# Patient Record
Sex: Female | Born: 1965 | Race: White | Hispanic: No | Marital: Married | Smoking: Never smoker
Health system: Southern US, Community
[De-identification: ages and names within clinical notes are randomized; demographics above are authoritative.]

## PROBLEM LIST (undated history)

## (undated) DIAGNOSIS — F329 Major depressive disorder, single episode, unspecified: Secondary | ICD-10-CM

## (undated) DIAGNOSIS — D649 Anemia, unspecified: Secondary | ICD-10-CM

## (undated) DIAGNOSIS — I10 Essential (primary) hypertension: Secondary | ICD-10-CM

## (undated) DIAGNOSIS — F32A Depression, unspecified: Secondary | ICD-10-CM

## (undated) DIAGNOSIS — G43909 Migraine, unspecified, not intractable, without status migrainosus: Secondary | ICD-10-CM

## (undated) DIAGNOSIS — F419 Anxiety disorder, unspecified: Secondary | ICD-10-CM

## (undated) HISTORY — PX: HERNIA REPAIR: SHX51

## (undated) HISTORY — DX: Migraine, unspecified, not intractable, without status migrainosus: G43.909

## (undated) HISTORY — DX: Essential (primary) hypertension: I10

## (undated) HISTORY — PX: GASTRIC BYPASS: SHX52

## (undated) HISTORY — PX: CHOLECYSTECTOMY: SHX55

## (undated) HISTORY — DX: Anemia, unspecified: D64.9

---

## 2001-04-20 HISTORY — PX: GALLBLADDER SURGERY: SHX652

## 2001-04-20 HISTORY — PX: DILATION AND CURETTAGE OF UTERUS: SHX78

## 2002-04-20 HISTORY — PX: GASTRIC BYPASS: SHX52

## 2003-04-21 HISTORY — PX: HERNIA REPAIR: SHX51

## 2015-07-09 ENCOUNTER — Encounter: Payer: Self-pay | Admitting: Family Medicine

## 2015-07-09 ENCOUNTER — Ambulatory Visit (INDEPENDENT_AMBULATORY_CARE_PROVIDER_SITE_OTHER): Payer: Self-pay | Admitting: Family Medicine

## 2015-07-09 VITALS — BP 137/83 | HR 63 | Temp 98.5°F | Resp 18 | Ht 63.0 in | Wt 179.2 lb

## 2015-07-09 DIAGNOSIS — F419 Anxiety disorder, unspecified: Secondary | ICD-10-CM | POA: Insufficient documentation

## 2015-07-09 DIAGNOSIS — D509 Iron deficiency anemia, unspecified: Secondary | ICD-10-CM

## 2015-07-09 DIAGNOSIS — R252 Cramp and spasm: Secondary | ICD-10-CM

## 2015-07-09 DIAGNOSIS — Z7189 Other specified counseling: Secondary | ICD-10-CM

## 2015-07-09 DIAGNOSIS — N920 Excessive and frequent menstruation with regular cycle: Secondary | ICD-10-CM

## 2015-07-09 DIAGNOSIS — Z9884 Bariatric surgery status: Secondary | ICD-10-CM

## 2015-07-09 DIAGNOSIS — E785 Hyperlipidemia, unspecified: Secondary | ICD-10-CM

## 2015-07-09 DIAGNOSIS — F32A Depression, unspecified: Secondary | ICD-10-CM

## 2015-07-09 DIAGNOSIS — Z7689 Persons encountering health services in other specified circumstances: Secondary | ICD-10-CM

## 2015-07-09 DIAGNOSIS — F418 Other specified anxiety disorders: Secondary | ICD-10-CM

## 2015-07-09 DIAGNOSIS — K635 Polyp of colon: Secondary | ICD-10-CM

## 2015-07-09 DIAGNOSIS — F329 Major depressive disorder, single episode, unspecified: Secondary | ICD-10-CM | POA: Insufficient documentation

## 2015-07-09 MED ORDER — MAGNESIUM 250 MG PO TABS: 1.0000 | ORAL_TABLET | Freq: Every day | ORAL | Status: DC

## 2015-07-09 MED ORDER — ESCITALOPRAM OXALATE 10 MG PO TABS: 10.0000 mg | ORAL_TABLET | Freq: Every day | ORAL | Status: DC

## 2015-07-09 NOTE — Progress Notes (Signed)
Subjective:    Patient ID: Crystal Levine, female    DOB: 10-27-1965, 50 y.o.   MRN: [id number]  HPI: Crystal Levine is a 50 y.o. female presenting on 07/09/2015 for Establish Care   HPI  Pt presents to establish care today. Previous care provider was [facility] in [place].  It has been 9 months since Her last PCP visit. Records from previous provider will be requested and reviewed. Current medical problems include:  Anemia- related to gastric bypass in 2004. Sees hematologist. Gets IV iron infusions. Previous hematologist was [facility] in [place]. Needs referral to hematology today. CBC last check in August. Iron transfusion at that time. Heavy periods- was scheduled for a uterine ablation but unable to do so due to her insurance change. Previous GYN in [place]. Menorrhagia x 2 years. Larges clots. Affects anemia. Needs referral to GYN. Had ultrasound done. No fibroids.  Depression/ Anxiety: Previously on effexor 278m and wellbutrin. Was on something for panic attacks in the past. Has had recent stressors with divorce. Was hospitalized twice for depression. Was on prozac for a little while.  Early morning awakenings. No SI/ HI Doesn't sleep well. Is having leg pain at night- thinks it could be anemia related or muscle cramping.    Health maintenance:  Last pap- December 2016- normal.  Due in 5 years.  Mammogram: Unsure family history of Brca due to adoption. Would like a screen a mammogram. Tdap: Unsure.  Colonoscopy 2 years ago found a pre-cancerous polyp- was told to come back in 2 years. [facility] [place]  Did not get flu shot.     Past Medical History  Diagnosis Date  . Anemia   . Migraine     In past   Social History   Social History  . Marital Status: Divorced    Spouse Name: N/A  . Number of Children: N/A  . Years of Education: N/A   Occupational History  . Not on file.   Social History Main  Topics  . Smoking status: Not on file  . Smokeless tobacco: Not on file  . Alcohol Use: No  . Drug Use: No  . Sexual Activity: Not on file   Other Topics Concern  . Not on file   Social History Narrative  . No narrative on file   Family History  Problem Relation Age of Onset  . Adopted: Yes   No current outpatient prescriptions on file prior to visit.   No current facility-administered medications on file prior to visit.    Review of Systems  Constitutional: Negative for fever and chills.  HENT: Negative.   Respiratory: Negative for cough, chest tightness and wheezing.   Cardiovascular: Negative for chest pain and leg swelling.  Gastrointestinal: Negative for nausea, vomiting, abdominal pain, diarrhea and constipation.  Endocrine: Negative.  Negative for cold intolerance, heat intolerance, polydipsia, polyphagia and polyuria.  Genitourinary: Negative for dysuria and difficulty urinating.  Musculoskeletal: Negative.   Neurological: Negative for dizziness, light-headedness and numbness.  Psychiatric/Behavioral: Positive for sleep disturbance. Negative for suicidal ideas. The patient is nervous/anxious.    Per HPI unless specifically indicated above     Objective:    BP 137/83 mmHg  Pulse 63  Temp(Src) 98.5 F (36.9 C) (Oral)  Resp 18  Ht 5' 3" (1.6 m)  Wt 179 lb 3.2 oz (81.285 kg)  BMI 31.75 kg/m2  SpO2 100%  Wt Readings from Last 3 Encounters:  07/09/15 179  lb 3.2 oz (81.285 kg)    GAD 7 : Generalized Anxiety Score 07/09/2015  Nervous, Anxious, on Edge 2  Control/stop worrying 3  Worry too much - different things 3  Trouble relaxing 3  Restless 2  Easily annoyed or irritable 1  Afraid - awful might happen 1  Total GAD 7 Score 15  Anxiety Difficulty Somewhat difficult   Depression screen PHQ 2/9 07/09/2015  Down, Depressed, Hopeless 1  PHQ - 2 Score 1  Altered sleeping 3  Tired, decreased energy 2  Change in appetite 2  Feeling bad or failure about  yourself  1  Trouble concentrating 1  Moving slowly or fidgety/restless 2  Suicidal thoughts 0  PHQ-9 Score 12  Difficult doing work/chores Somewhat difficult      Physical Exam  Constitutional: She is oriented to person, place, and time. She appears well-developed and well-nourished.  HENT:  Head: Normocephalic and atraumatic.  Neck: Neck supple.  Cardiovascular: Normal rate, regular rhythm and normal heart sounds.  Exam reveals no gallop and no friction rub.   No murmur heard. Pulmonary/Chest: Effort normal and breath sounds normal. She has no wheezes. She exhibits no tenderness.  Abdominal: Soft. Normal appearance and bowel sounds are normal. She exhibits no distension and no mass. There is no tenderness. There is no rebound and no guarding.  Musculoskeletal: Normal range of motion. She exhibits no edema or tenderness.  Lymphadenopathy:    She has no cervical adenopathy.  Neurological: She is alert and oriented to person, place, and time.  Skin: Skin is warm and dry.  Psychiatric: She has a normal mood and affect. Her speech is normal and behavior is normal. Judgment and thought content normal. Cognition and memory are normal.   No results found for this or any previous visit.    Assessment & Plan:   Problem List Items Addressed This Visit      Other   Iron deficiency anemia    Check anemia profile. Refer to hematology for management and iron infusions if needed.       Relevant Orders   Comprehensive Metabolic Panel (CMET)   Anemia Profile A   Ambulatory referral to Hematology   Menorrhagia with regular cycle    Refer to GYN to see if ablation is still indicated. Records from GYN have been requested and are pending.       Relevant Orders   Ambulatory referral to Obstetrics / Gynecology   Anxiety and depression    Start lexapro for anxiety symptoms. Check B12, TSH, folate, and vitamin D. Encouraged daily exercise. Reviewed side effects of medication. Consider psych  referral given pt past hospitalizations if not helping.  Recheck 1 mos.       Relevant Medications   escitalopram (LEXAPRO) 10 MG tablet   Other Relevant Orders   VITAMIN D 25 Hydroxy (Vit-D Deficiency, Fractures)   TSH   S/P gastric bypass    Check B12 and folate due to surgery to determine if replacement is needed.       Relevant Orders   Comprehensive Metabolic Panel (CMET)   E52 and Folate Panel   VITAMIN D 25 Hydroxy (Vit-D Deficiency, Fractures)    Other Visit Diagnoses    Encounter to establish care    -  Primary    Mild hyperlipidemia        Check lipid panel.     Relevant Orders    Lipid Profile    Colon polyp  Relevant Orders    Ambulatory referral to General Surgery    Muscle cramps        Check CMET. Trial of magnesium at bedtime to help with symptoms.     Relevant Medications    Magnesium 250 MG TABS       Meds ordered this encounter  Medications  . escitalopram (LEXAPRO) 10 MG tablet    Sig: Take 1 tablet (10 mg total) by mouth daily.    Dispense:  30 tablet    Refill:  11    Order Specific Question:  Supervising Provider    Answer:  Arlis Porta [phone]  . Magnesium 250 MG TABS    Sig: Take 1 tablet (250 mg total) by mouth at bedtime.    Dispense:  30 tablet    Refill:  11    Order Specific Question:  Supervising Provider    Answer:  Arlis Porta [phone]      Follow up plan: Return in about 4 weeks (around 08/06/2015) for anxiety.Marland Kitchen

## 2015-07-09 NOTE — Assessment & Plan Note (Signed)
Refer to GYN to see if ablation is still indicated. Records from GYN have been requested and are pending.

## 2015-07-09 NOTE — Patient Instructions (Signed)
Depression and Anxiety: Let's try escitalopram at bedtime for your symptoms. To start the medication take 1/2 tablet at bedtime for 4 days and then resume full dose. Major side effects are drowsiness, GI upset. Taking at night usually helps.

## 2015-07-09 NOTE — Assessment & Plan Note (Signed)
Check B12 and folate due to surgery to determine if replacement is needed.

## 2015-07-09 NOTE — Assessment & Plan Note (Signed)
Check anemia profile. Refer to hematology for management and iron infusions if needed.

## 2015-07-09 NOTE — Assessment & Plan Note (Signed)
Start lexapro for anxiety symptoms. Check B12, TSH, folate, and vitamin D. Encouraged daily exercise. Reviewed side effects of medication. Consider psych referral given pt past hospitalizations if not helping.  Recheck 1 mos.

## 2015-07-11 LAB — B12 AND FOLATE PANEL
FOLATE: 11.9 ng/mL (ref >3.0)
Vitamin B-12: 107 pg/mL — ABNORMAL LOW (ref 211–946)

## 2015-07-11 LAB — COMPREHENSIVE METABOLIC PANEL
ALBUMIN: 4.3 g/dL (ref 3.5–5.5)
ALK PHOS: 93 IU/L (ref 39–117)
ALT: 8 IU/L (ref 0–32)
AST: 13 IU/L (ref 0–40)
Albumin/Globulin Ratio: 1.7 (ref 1.2–2.2)
BUN / CREAT RATIO: 20 (ref 9–23)
BUN: 12 mg/dL (ref 6–24)
Bilirubin Total: 0.4 mg/dL (ref 0.0–1.2)
CHLORIDE: 105 mmol/L (ref 96–106)
CO2: 21 mmol/L (ref 18–29)
CREATININE: 0.61 mg/dL (ref 0.57–1.00)
Calcium: 9.4 mg/dL (ref 8.7–10.2)
GFR calc Af Amer: 125 mL/min/{1.73_m2} (ref >59)
GFR calc non Af Amer: 108 mL/min/{1.73_m2} (ref >59)
GLUCOSE: 102 mg/dL — AB (ref 65–99)
Globulin, Total: 2.6 g/dL (ref 1.5–4.5)
Potassium: 5.2 mmol/L (ref 3.5–5.2)
Sodium: 140 mmol/L (ref 134–144)
Total Protein: 6.9 g/dL (ref 6.0–8.5)

## 2015-07-11 LAB — ANEMIA PROFILE A
BASOS ABS: 0 10*3/uL (ref 0.0–0.2)
BASOS: 1 %
EOS (ABSOLUTE): 0.1 10*3/uL (ref 0.0–0.4)
EOS: 3 %
HEMOGLOBIN: 11.9 g/dL (ref 11.1–15.9)
Hematocrit: 36.6 % (ref 34.0–46.6)
IRON SATURATION: 8 % — AB (ref 15–55)
IRON: 33 ug/dL (ref 27–159)
Immature Grans (Abs): 0 10*3/uL (ref 0.0–0.1)
Immature Granulocytes: 0 %
LYMPHS ABS: 1 10*3/uL (ref 0.7–3.1)
Lymphs: 27 %
MCH: 27.6 pg (ref 26.6–33.0)
MCHC: 32.5 g/dL (ref 31.5–35.7)
MCV: 85 fL (ref 79–97)
Monocytes Absolute: 0.4 10*3/uL (ref 0.1–0.9)
Monocytes: 10 %
Neutrophils Absolute: 2.3 10*3/uL (ref 1.4–7.0)
Neutrophils: 59 %
PLATELETS: 214 10*3/uL (ref 150–379)
RBC: 4.31 x10E6/uL (ref 3.77–5.28)
RDW: 13.1 % (ref 12.3–15.4)
Retic Ct Pct: 0.8 % (ref 0.6–2.6)
TIBC: 393 ug/dL (ref 250–450)
UIBC: 360 ug/dL (ref 131–425)
WBC: 3.8 10*3/uL (ref 3.4–10.8)

## 2015-07-11 LAB — VITAMIN D 25 HYDROXY (VIT D DEFICIENCY, FRACTURES): Vit D, 25-Hydroxy: 10 ng/mL — ABNORMAL LOW (ref 30.0–100.0)

## 2015-07-11 LAB — LIPID PANEL
CHOLESTEROL TOTAL: 135 mg/dL (ref 100–199)
Chol/HDL Ratio: 1.9 ratio units (ref 0.0–4.4)
HDL: 71 mg/dL (ref >39)
LDL Calculated: 50 mg/dL (ref 0–99)
Triglycerides: 68 mg/dL (ref 0–149)
VLDL CHOLESTEROL CAL: 14 mg/dL (ref 5–40)

## 2015-07-11 LAB — TSH: TSH: 0.328 u[IU]/mL — ABNORMAL LOW (ref 0.450–4.500)

## 2015-07-12 ENCOUNTER — Telehealth: Payer: Self-pay

## 2015-07-12 ENCOUNTER — Other Ambulatory Visit: Payer: Self-pay

## 2015-07-12 NOTE — Telephone Encounter (Signed)
Gastroenterology Pre-Procedure Review  Request Date: 07/29/15 Requesting Physician: Cecille Amsterdam, NP  PATIENT REVIEW QUESTIONS: The patient responded to the following health history questions as indicated:    1. Are you having any GI issues? no 2. Do you have a personal history of Polyps? no 3. Do you have a family history of Colon Cancer or Polyps? no 4. Diabetes Mellitus? no 5. Joint replacements in the past 12 months?no 6. Major health problems in the past 3 months?no 7. Any artificial heart valves, MVP, or defibrillator?no    MEDICATIONS & ALLERGIES:    Patient reports the following regarding taking any anticoagulation/antiplatelet therapy:   Plavix, Coumadin, Eliquis, Xarelto, Lovenox, Pradaxa, Brilinta, or Effient? no Aspirin? no  Patient confirms/reports the following medications:  Current Outpatient Prescriptions  Medication Sig Dispense Refill  . escitalopram (LEXAPRO) 10 MG tablet Take 1 tablet (10 mg total) by mouth daily. 30 tablet 11  . Magnesium 250 MG TABS Take 1 tablet (250 mg total) by mouth at bedtime. 30 tablet 11   No current facility-administered medications for this visit.    Patient confirms/reports the following allergies:  No Known Allergies  No orders of the defined types were placed in this encounter.    AUTHORIZATION INFORMATION Primary Insurance: 1D#: Group #:  Secondary Insurance: 1D#: Group #:  SCHEDULE INFORMATION: Date: 07/29/15 Time: Location: [place]

## 2015-07-15 ENCOUNTER — Other Ambulatory Visit: Payer: Self-pay | Admitting: Family Medicine

## 2015-07-15 DIAGNOSIS — E559 Vitamin D deficiency, unspecified: Secondary | ICD-10-CM

## 2015-07-15 MED ORDER — VITAMIN D (ERGOCALCIFEROL) 1.25 MG (50000 UNIT) PO CAPS: 50000.0000 [IU] | ORAL_CAPSULE | ORAL | Status: DC

## 2015-07-16 ENCOUNTER — Telehealth: Payer: Self-pay | Admitting: Family Medicine

## 2015-07-16 NOTE — Telephone Encounter (Signed)
Pt. Return call for labs Pt call back  # is [phone]

## 2015-07-16 NOTE — Telephone Encounter (Signed)
LMTCB

## 2015-07-18 LAB — T3, FREE: T3, Free: 2.5 pg/mL (ref 2.0–4.4)

## 2015-07-18 LAB — T4, FREE: FREE T4: 1.09 ng/dL (ref 0.82–1.77)

## 2015-07-18 LAB — SPECIMEN STATUS REPORT

## 2015-07-18 LAB — T3: T3 TOTAL: 94 ng/dL (ref 71–180)

## 2015-07-18 LAB — T4: T4 TOTAL: 7.3 ug/dL (ref 4.5–12.0)

## 2015-07-22 ENCOUNTER — Ambulatory Visit (INDEPENDENT_AMBULATORY_CARE_PROVIDER_SITE_OTHER): Payer: Medicaid Other

## 2015-07-22 VITALS — Resp 16 | Ht 63.0 in | Wt 178.0 lb

## 2015-07-22 DIAGNOSIS — E538 Deficiency of other specified B group vitamins: Secondary | ICD-10-CM | POA: Diagnosis not present

## 2015-07-22 MED ORDER — CYANOCOBALAMIN 1000 MCG/ML IJ SOLN
1000.0000 ug | Freq: Once | INTRAMUSCULAR | Status: AC
  Administered 2015-07-22: 1000 ug via INTRAMUSCULAR

## 2015-07-22 MED ORDER — VITAMIN B-12 1000 MCG PO TABS: 1000.0000 ug | ORAL_TABLET | Freq: Every day | ORAL | Status: DC

## 2015-07-23 ENCOUNTER — Ambulatory Visit: Payer: Medicaid Other

## 2015-07-24 ENCOUNTER — Inpatient Hospital Stay: Payer: Medicaid Other | Attending: Internal Medicine | Admitting: Internal Medicine

## 2015-07-24 VITALS — BP 138/78 | HR 78 | Temp 97.8°F | Resp 18 | Wt 175.9 lb

## 2015-07-24 DIAGNOSIS — Z8669 Personal history of other diseases of the nervous system and sense organs: Secondary | ICD-10-CM

## 2015-07-24 DIAGNOSIS — D509 Iron deficiency anemia, unspecified: Secondary | ICD-10-CM | POA: Diagnosis not present

## 2015-07-24 DIAGNOSIS — Z79899 Other long term (current) drug therapy: Secondary | ICD-10-CM

## 2015-07-24 DIAGNOSIS — Z9884 Bariatric surgery status: Secondary | ICD-10-CM

## 2015-07-24 DIAGNOSIS — E538 Deficiency of other specified B group vitamins: Secondary | ICD-10-CM | POA: Insufficient documentation

## 2015-07-24 DIAGNOSIS — R5383 Other fatigue: Secondary | ICD-10-CM | POA: Insufficient documentation

## 2015-07-24 NOTE — Progress Notes (Signed)
[name]  Patient Care Team: Amy Overton Mam, NP as PCP - General (Family Medicine)  CHIEF COMPLAINTS/PURPOSE OF CONSULTATION:   #  Iron Def sec to gastric bypass-   # B12 def on IM B12 injections  # GASTRIC BY PASS [2004]   HISTORY OF PRESENTING ILLNESS:  Crystal Levine [age] y.o.  female  With above history of gastric bypass-  Leading to iron deficiency/ B12 deficiency secondary to malabsorption.  Patient has been receiving IV iron/ Feraheme every 6 months or so  Throughout hematologist in [place].  She moved the area recently.  Her iron levels were checked by her PCP that showed  Iron saturation was 8%/ hemoglobin was normal.   Patient is extremely tired;  This is helped by IV iron every 6 months or so.  Denies any blood in stools or black stools.  She does not take by mouth iron because of  Malabsorption issues.  Her last IV iron was in August 2016.  She has recently been started on B12 injections through PCP.  She requests a nutrition evaluation.   ROS: A complete 10 point review of system is done which is negative except mentioned above in history of present illness  MEDICAL HISTORY:  Past Medical History  Diagnosis Date  . Anemia   . Migraine     In past    SURGICAL HISTORY: Past Surgical History  Procedure Laterality Date  . Gallbladder surgery  2003  . Gastric bypass  2004  . Hernia repair  2005  . Dilation and curettage of uterus  2003    2 Miscarriages    SOCIAL HISTORY: Social History   Social History  . Marital Status: Divorced    Spouse Name: N/A  . Number of Children: N/A  . Years of Education: N/A   Occupational History  . Not on file.   Social History Main Topics  . Smoking status: Never Smoker   . Smokeless tobacco: Not on file  . Alcohol Use: No  . Drug Use: No  . Sexual Activity: Not on file   Other Topics Concern  . Not on file   Social History Narrative  . No narrative on file    FAMILY  HISTORY: Family History  Problem Relation Age of Onset  . Adopted: Yes    ALLERGIES:  has No Known Allergies.  MEDICATIONS:  Current Outpatient Prescriptions  Medication Sig Dispense Refill  . Cyanocobalamin (VITAMIN B-12 IJ) Inject 1,000 mcg as directed every 30 (thirty) days.    Marland Kitchen escitalopram (LEXAPRO) 10 MG tablet Take 1 tablet (10 mg total) by mouth daily. 30 tablet 11  . Magnesium 250 MG TABS Take 1 tablet (250 mg total) by mouth at bedtime. 30 tablet 11  . Vitamin D, Ergocalciferol, (DRISDOL) 50000 units CAPS capsule Take 1 capsule (50,000 Units total) by mouth every 7 (seven) days. 12 capsule 1   No current facility-administered medications for this visit.      Marland Kitchen  PHYSICAL EXAMINATION:   Filed Vitals:   07/24/15 1420  BP: 138/78  Pulse: 78  Temp: 97.8 F (36.6 C)  Resp: 18   Filed Weights   07/24/15 1420  Weight: 175 lb 14.8 oz (79.8 kg)    GENERAL: Well-nourished well-developed; Alert, no distress and comfortable.  Alone.  EYES: no pallor or icterus OROPHARYNX: no thrush or ulceration; good dentition  NECK: supple, no masses felt LYMPH:  no palpable lymphadenopathy in the cervical, axillary or inguinal regions LUNGS:  clear to auscultation and  No wheeze or crackles HEART/CVS: regular rate & rhythm and no murmurs; No lower extremity edema ABDOMEN: abdomen soft, non-tender and normal bowel sounds Musculoskeletal:no cyanosis of digits and no clubbing  PSYCH: alert & oriented x 3 with fluent speech NEURO: no focal motor/sensory deficits SKIN:  no rashes or significant lesions  LABORATORY DATA:  I have reviewed the data as listed Lab Results  Component Value Date   WBC 3.8 07/10/2015   HCT 36.6 07/10/2015   MCV 85 07/10/2015   PLT 214 07/10/2015    Recent Labs  07/10/15 1030  NA 140  K 5.2  CL 105  CO2 21  GLUCOSE 102*  BUN 12  CREATININE 0.61  CALCIUM 9.4  GFRNONAA 108  GFRAA 125  PROT 6.9  ALBUMIN 4.3  AST 13  ALT 8  ALKPHOS 93   BILITOT 0.4      ASSESSMENT & PLAN:   # Iron deficiency-  Saturations 8%/ hemoglobin 11.1 secondary to gastric bypass/malabsorption-  Start IV Feraheme every 6 months with Benadryl premedication.  Patient is awaiting an  A colonoscopy next week.  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 Patient will get B12 injections through PCP.  #  Patient follow-up with me in 6 months with IV Feraheme/ labs  Few days prior.  Thank you Ms. Vincenza Hews for allowing me to participate in the care of your pleasant patient. Please do not hesitate to contact me with questions or concerns in the interim.  # 30 minutes face-to-face with the patient discussing the above plan of care; more than 50% of time spent on counseling and coordination.     Cammie Sickle, MD 07/24/2015 2:41 PM

## 2015-07-25 NOTE — Discharge Instructions (Signed)

## 2015-07-26 ENCOUNTER — Telehealth: Payer: Self-pay | Admitting: Gastroenterology

## 2015-07-26 ENCOUNTER — Inpatient Hospital Stay: Payer: Medicaid Other

## 2015-07-26 VITALS — BP 147/88 | HR 57 | Temp 96.3°F | Resp 20

## 2015-07-26 DIAGNOSIS — D509 Iron deficiency anemia, unspecified: Secondary | ICD-10-CM | POA: Diagnosis not present

## 2015-07-26 MED ORDER — SODIUM CHLORIDE 0.9 % IV SOLN
Freq: Once | INTRAVENOUS | Status: AC
  Administered 2015-07-26: 11:00:00 via INTRAVENOUS
  Filled 2015-07-26: qty 1000

## 2015-07-26 MED ORDER — SODIUM CHLORIDE 0.9 % IV SOLN
510.0000 mg | Freq: Once | INTRAVENOUS | Status: AC
Start: 2015-07-26 — End: 2015-07-26
  Administered 2015-07-26: 510 mg via INTRAVENOUS
  Filled 2015-07-26: qty 17

## 2015-07-26 MED ORDER — DIPHENHYDRAMINE HCL 50 MG/ML IJ SOLN
25.0000 mg | Freq: Once | INTRAMUSCULAR | Status: AC
  Administered 2015-07-26: 25 mg via INTRAVENOUS
  Filled 2015-07-26: qty 1

## 2015-07-26 NOTE — Telephone Encounter (Signed)
Left vm requesting pt to call back and confirm she was cancelling her procedure.

## 2015-07-26 NOTE — Telephone Encounter (Signed)
Patient left voice message that she needs to reschedule her procedure Monday due to starting a new job. Please call her.

## 2015-07-29 ENCOUNTER — Encounter: Admission: RE | Payer: Self-pay | Source: Ambulatory Visit

## 2015-07-29 ENCOUNTER — Encounter: Payer: Self-pay | Admitting: Anesthesiology

## 2015-07-29 ENCOUNTER — Ambulatory Visit: Admission: RE | Admit: 2015-07-29 | Payer: Medicaid Other | Source: Ambulatory Visit | Admitting: Gastroenterology

## 2015-07-29 SURGERY — COLONOSCOPY WITH PROPOFOL
Anesthesia: Choice

## 2015-07-31 ENCOUNTER — Ambulatory Visit: Payer: Medicaid Other

## 2015-08-02 ENCOUNTER — Ambulatory Visit: Payer: Medicaid Other

## 2015-08-05 ENCOUNTER — Telehealth: Payer: Self-pay | Admitting: Family Medicine

## 2015-08-05 DIAGNOSIS — F329 Major depressive disorder, single episode, unspecified: Secondary | ICD-10-CM

## 2015-08-05 DIAGNOSIS — F32A Depression, unspecified: Secondary | ICD-10-CM

## 2015-08-05 DIAGNOSIS — F419 Anxiety disorder, unspecified: Principal | ICD-10-CM

## 2015-08-05 MED ORDER — ESCITALOPRAM OXALATE 10 MG PO TABS: 10.0000 mg | ORAL_TABLET | Freq: Every day | ORAL | Status: DC

## 2015-08-05 NOTE — Telephone Encounter (Signed)
Done

## 2015-08-05 NOTE — Telephone Encounter (Signed)
Pt needs a refill on lexapro sent to [facility] in [place]

## 2015-08-06 ENCOUNTER — Ambulatory Visit: Payer: Self-pay | Admitting: Family Medicine

## 2015-08-28 ENCOUNTER — Encounter: Payer: Medicaid Other | Admitting: Obstetrics and Gynecology

## 2015-09-04 ENCOUNTER — Encounter: Payer: Self-pay | Admitting: Family Medicine

## 2015-09-04 ENCOUNTER — Ambulatory Visit (INDEPENDENT_AMBULATORY_CARE_PROVIDER_SITE_OTHER): Payer: Medicaid Other | Admitting: Family Medicine

## 2015-09-04 VITALS — BP 116/72 | HR 98 | Temp 98.2°F | Resp 16 | Ht 63.0 in | Wt 181.0 lb

## 2015-09-04 DIAGNOSIS — E538 Deficiency of other specified B group vitamins: Secondary | ICD-10-CM | POA: Diagnosis not present

## 2015-09-04 DIAGNOSIS — E559 Vitamin D deficiency, unspecified: Secondary | ICD-10-CM

## 2015-09-04 DIAGNOSIS — F32A Depression, unspecified: Secondary | ICD-10-CM

## 2015-09-04 DIAGNOSIS — G4700 Insomnia, unspecified: Secondary | ICD-10-CM | POA: Diagnosis not present

## 2015-09-04 DIAGNOSIS — F419 Anxiety disorder, unspecified: Secondary | ICD-10-CM

## 2015-09-04 DIAGNOSIS — F418 Other specified anxiety disorders: Secondary | ICD-10-CM

## 2015-09-04 DIAGNOSIS — F329 Major depressive disorder, single episode, unspecified: Secondary | ICD-10-CM

## 2015-09-04 DIAGNOSIS — E0590 Thyrotoxicosis, unspecified without thyrotoxic crisis or storm: Secondary | ICD-10-CM | POA: Diagnosis not present

## 2015-09-04 MED ORDER — DOXYLAMINE SUCCINATE (SLEEP) 25 MG PO TABS: 25.0000 mg | ORAL_TABLET | Freq: Every evening | ORAL | Status: DC | PRN

## 2015-09-04 NOTE — Assessment & Plan Note (Signed)
Continues on therapy. Recheck labs in 1 mos.

## 2015-09-04 NOTE — Progress Notes (Signed)
Subjective:    Patient ID: Crystal Levine, female    DOB: 22-May-1965, 50 y.o.   MRN: [id number]  HPI: Crystal Levine is a 50 y.o. female presenting on 09/04/2015 for Anxiety   HPI  Pt presents for recheck of anxiety. Lexapro is helping.  Tolerating dose. Still not sleeping well. Leg pain has decreased. Has a new job.Poor sleep- has tried melatonin. No relief. Early AM awakenings. Wakes at 2-3am and can't sleep- feels wired. Goes to bed at 930-10pm. Goal for wax up is 545am. Sometimes she falls back to sleep. Has tried [place].   Ears have hard wax. Wants to know if she needs ear lavage.  Past Medical History  Diagnosis Date  . Anemia   . Migraine     In past    Current Outpatient Prescriptions on File Prior to Visit  Medication Sig  . Cyanocobalamin (VITAMIN B-12 IJ) Inject 1,000 mcg as directed every 30 (thirty) days.  Marland Kitchen escitalopram (LEXAPRO) 10 MG tablet Take 1 tablet (10 mg total) by mouth daily.  . Magnesium 250 MG TABS Take 1 tablet (250 mg total) by mouth at bedtime.  . Vitamin D, Ergocalciferol, (DRISDOL) 50000 units CAPS capsule Take 1 capsule (50,000 Units total) by mouth every 7 (seven) days.   No current facility-administered medications on file prior to visit.    Review of Systems  Constitutional: Negative for fever and chills.  HENT: Negative.   Respiratory: Negative for cough, chest tightness and wheezing.   Cardiovascular: Negative for chest pain and leg swelling.  Gastrointestinal: Negative for nausea, vomiting, abdominal pain, diarrhea and constipation.  Endocrine: Negative.  Negative for cold intolerance, heat intolerance, polydipsia, polyphagia and polyuria.  Genitourinary: Negative for dysuria and difficulty urinating.  Musculoskeletal: Negative.   Neurological: Negative for dizziness, light-headedness and numbness.  Psychiatric/Behavioral: Positive for sleep disturbance. Negative for suicidal ideas, behavioral problems and agitation. The patient is  nervous/anxious (improved.).    Per HPI unless specifically indicated above GAD 7 : Generalized Anxiety Score 09/04/2015 07/09/2015  Nervous, Anxious, on Edge 1 2  Control/stop worrying 1 3  Worry too much - different things 1 3  Trouble relaxing 2 3  Restless 0 2  Easily annoyed or irritable 0 1  Afraid - awful might happen 0 1  Total GAD 7 Score 5 15  Anxiety Difficulty Not difficult at all Somewhat difficult          Objective:    BP 116/72 mmHg  Pulse 98  Temp(Src) 98.2 F (36.8 C) (Oral)  Resp 16  Ht 5\' 3"  (1.6 m)  Wt 181 lb (82.101 kg)  BMI 32.07 kg/m2  Wt Readings from Last 3 Encounters:  09/04/15 181 lb (82.101 kg)  07/24/15 175 lb 14.8 oz (79.8 kg)  07/22/15 178 lb (80.74 kg)    Physical Exam  Constitutional: She is oriented to person, place, and time. She appears well-developed and well-nourished.  HENT:  Head: Normocephalic and atraumatic.  Neck: Neck supple.  Cardiovascular: Normal rate, regular rhythm and normal heart sounds.  Exam reveals no gallop and no friction rub.   No murmur heard. Pulmonary/Chest: Effort normal and breath sounds normal. She has no wheezes. She exhibits no tenderness.  Abdominal: Soft. Normal appearance and bowel sounds are normal. She exhibits no distension and no mass. There is no tenderness. There is no rebound and no guarding.  Musculoskeletal: Normal range of motion. She exhibits no edema or tenderness.  Lymphadenopathy:    She has no cervical adenopathy.  Neurological: She is alert and oriented to person, place, and time.  Skin: Skin is warm and dry.  Psychiatric: She has a normal mood and affect. Her behavior is normal. Judgment and thought content normal.   Results for orders placed or performed in visit on 07/09/15  Comprehensive Metabolic Panel (CMET)  Result Value Ref Range   Glucose 102 (H) 65 - 99 mg/dL   BUN 12 6 - 24 mg/dL   Creatinine, Ser 0.61 0.57 - 1.00 mg/dL   GFR calc non Af Amer 108 >59 mL/min/1.73   GFR  calc Af Amer 125 >59 mL/min/1.73   BUN/Creatinine Ratio 20 9 - 23   Sodium 140 134 - 144 mmol/L   Potassium 5.2 3.5 - 5.2 mmol/L   Chloride 105 96 - 106 mmol/L   CO2 21 18 - 29 mmol/L   Calcium 9.4 8.7 - 10.2 mg/dL   Total Protein 6.9 6.0 - 8.5 g/dL   Albumin 4.3 3.5 - 5.5 g/dL   Globulin, Total 2.6 1.5 - 4.5 g/dL   Albumin/Globulin Ratio 1.7 1.2 - 2.2   Bilirubin Total 0.4 0.0 - 1.2 mg/dL   Alkaline Phosphatase 93 39 - 117 IU/L   AST 13 0 - 40 IU/L   ALT 8 0 - 32 IU/L  Lipid Profile  Result Value Ref Range   Cholesterol, Total 135 100 - 199 mg/dL   Triglycerides 68 0 - 149 mg/dL   HDL 71 >39 mg/dL   VLDL Cholesterol Cal 14 5 - 40 mg/dL   LDL Calculated 50 0 - 99 mg/dL   Chol/HDL Ratio 1.9 0.0 - 4.4 ratio units  B12 and Folate Panel  Result Value Ref Range   Vitamin B-12 107 (L) 211 - 946 pg/mL   Folate 11.9 >3.0 ng/mL  VITAMIN D 25 Hydroxy (Vit-D Deficiency, Fractures)  Result Value Ref Range   Vit D, 25-Hydroxy 10.0 (L) 30.0 - 100.0 ng/mL  TSH  Result Value Ref Range   TSH 0.328 (L) 0.450 - 4.500 uIU/mL  Anemia Profile A  Result Value Ref Range   Total Iron Binding Capacity 393 250 - 450 ug/dL   UIBC 360 131 - 425 ug/dL   Iron 33 27 - 159 ug/dL   Iron Saturation 8 (LL) 15 - 55 %   WBC 3.8 3.4 - 10.8 x10E3/uL   RBC 4.31 3.77 - 5.28 x10E6/uL   Hemoglobin 11.9 11.1 - 15.9 g/dL   Hematocrit 36.6 34.0 - 46.6 %   MCV 85 79 - 97 fL   MCH 27.6 26.6 - 33.0 pg   MCHC 32.5 31.5 - 35.7 g/dL   RDW 13.1 12.3 - 15.4 %   Platelets 214 150 - 379 x10E3/uL   Neutrophils 59 %   Lymphs 27 %   Monocytes 10 %   Eos 3 %   Basos 1 %   Neutrophils Absolute 2.3 1.4 - 7.0 x10E3/uL   Lymphocytes Absolute 1.0 0.7 - 3.1 x10E3/uL   Monocytes Absolute 0.4 0.1 - 0.9 x10E3/uL   EOS (ABSOLUTE) 0.1 0.0 - 0.4 x10E3/uL   Basophils Absolute 0.0 0.0 - 0.2 x10E3/uL   Immature Granulocytes 0 %   Immature Grans (Abs) 0.0 0.0 - 0.1 x10E3/uL   Retic Ct Pct 0.8 0.6 - 2.6 %  T4, free  Result Value  Ref Range   Free T4 1.09 0.82 - 1.77 ng/dL  T4  Result Value Ref Range   T4, Total 7.3 4.5 - 12.0 ug/dL  T3  Result Value Ref  Range   T3, Total 94 71 - 180 ng/dL  T3, free  Result Value Ref Range   T3, Free 2.5 2.0 - 4.4 pg/mL  Specimen status report  Result Value Ref Range   specimen status report Comment       Assessment & Plan:   Problem List Items Addressed This Visit      Digestive   B12 deficiency    Continues on therapy. Recheck labs in 1 mos.       Relevant Orders   Vitamin B12     Endocrine   Subclinical hyperthyroidism    Recheck TSH. No symptoms. Consider endocrine referral with continued low TSH.       Relevant Orders   TSH   T4, free   T3, free     Other   Anxiety and depression    Improved. Continue current dose of lexapro.       Insomnia - Primary    Trial of unisom PRN for poor sleep. Reviewed sleep hygiene. Consider trazodone.       Relevant Medications   doxylamine, Sleep, (UNISOM) 25 MG tablet   Vitamin D deficiency    Recheck vitamin D.       Relevant Orders   VITAMIN D 25 Hydroxy (Vit-D Deficiency, Fractures)      Meds ordered this encounter  Medications  . doxylamine, Sleep, (UNISOM) 25 MG tablet    Sig: Take 1 tablet (25 mg total) by mouth at bedtime as needed.    Dispense:  30 tablet    Refill:  1    Order Specific Question:  Supervising Provider    Answer:  Arlis Porta [id number]      Follow up plan: Return in about 1 month (around 10/05/2015) for Lab work.

## 2015-09-04 NOTE — Assessment & Plan Note (Signed)
Improved.  Continue current dose of lexapro

## 2015-09-04 NOTE — Assessment & Plan Note (Signed)
Recheck TSH. No symptoms. Consider endocrine referral with continued low TSH.

## 2015-09-04 NOTE — Patient Instructions (Addendum)
Sleep: Try unisom about 30 minutes prior to bedtime to see if that helps with your sleep. If not, we can try trazodone at bedtime to help.  Please let me know if unisom is not working and we will try the other medication.  Learning About Sleeping Well What does sleeping well mean? Sleeping well means getting enough sleep. How much sleep is enough varies among people. The number of hours you sleep is not as important as how you feel when you wake up. If you do not feel refreshed, you probably need more sleep. Another sign of not getting enough sleep is feeling tired during the day. The average total nightly sleep time is 7 to 8 hours. Healthy adults may need a little more or a little less than this. Why is getting enough sleep important? Getting enough quality sleep is a basic part of good health. When your sleep suffers, your mood and your thoughts can suffer too. You may find yourself feeling more grumpy or stressed. Not getting enough sleep also can lead to serious problems, including injury, accidents, anxiety, and depression. What might cause poor sleeping? Many things can cause sleep problems, including: 1. Stress. Stress can be caused by fear about a single event, such as giving a speech. Or you may have ongoing stress, such as worry about work or school. 2. Depression, anxiety, and other mental or emotional conditions. 3. Changes in your sleep habits or surroundings. This includes changes that happen where you sleep, such as noise, light, or sleeping in a different bed. It also includes changes in your sleep pattern, such as having jet lag or working a late shift. 4. Health problems, such as pain, breathing problems, and restless legs syndrome. 5. Lack of regular exercise. How can you help yourself? Here are some tips that may help you sleep more soundly and wake up feeling more refreshed. Your sleeping area  1. Use your bedroom only for sleeping and sex. A bit of light reading may help  you fall asleep. But if it doesn't, do your reading elsewhere in the house. Don't watch TV in bed. 2. Be sure your bed is big enough to stretch out comfortably, especially if you have a sleep partner. 3. Keep your bedroom quiet, dark, and cool. Use curtains, blinds, or a sleep mask to block out light. To block out noise, use earplugs, soothing music, or a "white noise" machine. Your evening and bedtime routine  1. Create a relaxing bedtime routine. You might want to take a warm shower or bath, listen to soothing music, or drink a cup of noncaffeinated tea. 2. Go to bed at the same time every night. And get up at the same time every morning, even if you feel tired. What to avoid  1. Limit caffeine (coffee, tea, caffeinated sodas) during the day, and don't have any for at least 4 to 6 hours before bedtime. 2. Don't drink alcohol before bedtime. Alcohol can cause you to wake up more often during the night. 3. Don't smoke or use tobacco, especially in the evening. Nicotine can keep you awake. 4. Don't take naps during the day, especially close to bedtime. 5. Don't lie in bed awake for too long. If you can't fall asleep, or if you wake up in the middle of the night and can't get back to sleep within 15 minutes or so, get out of bed and go to another room until you feel sleepy. 6. Don't take medicine right before bed that may keep  you awake or make you feel hyper or energized. Your doctor can tell you if your medicine may do this and if you can take it earlier in the day. If you can't sleep   Imagine yourself in a peaceful, pleasant scene. Focus on the details and feelings of being in a place that is relaxing.  Get up and do a quiet or boring activity until you feel sleepy.  Don't drink any liquids after 6 p.m. if you wake up often because you have to go to the bathroom.  Trazodone tablets What is this medicine? TRAZODONE (TRAZ oh done) is used to treat depression. This medicine may be used for  other purposes; ask your health care provider or pharmacist if you have questions. What should I tell my health care provider before I take this medicine? They need to know if you have any of these conditions: -attempted suicide or thinking about it -bipolar disorder -bleeding problems -glaucoma -heart disease, or previous heart attack -irregular heart beat -kidney or liver disease -low levels of sodium in the blood -an unusual or allergic reaction to trazodone, other medicines, foods, dyes or preservatives -pregnant or trying to get pregnant -breast-feeding How should I use this medicine? Take this medicine by mouth with a glass of water. Follow the directions on the prescription label. Take this medicine shortly after a meal or a light snack. Take your medicine at regular intervals. Do not take your medicine more often than directed. Do not stop taking this medicine suddenly except upon the advice of your doctor. Stopping this medicine too quickly may cause serious side effects or your condition may worsen. A special MedGuide will be given to you by the pharmacist with each prescription and refill. Be sure to read this information carefully each time. Talk to your pediatrician regarding the use of this medicine in children. Special care may be needed. Overdosage: If you think you have taken too much of this medicine contact a poison control center or emergency room at once. NOTE: This medicine is only for you. Do not share this medicine with others. What if I miss a dose? If you miss a dose, take it as soon as you can. If it is almost time for your next dose, take only that dose. Do not take double or extra doses. What may interact with this medicine? Do not take this medicine with any of the following medications: -certain medicines for fungal infections like fluconazole, itraconazole, ketoconazole, posaconazole, voriconazole -cisapride -dofetilide -dronedarone -linezolid -MAOIs like  Carbex, Eldepryl, Marplan, Nardil, and Parnate -mesoridazine -methylene blue (injected into a vein) -pimozide -saquinavir -thioridazine -ziprasidone This medicine may also interact with the following medications: -alcohol -antiviral medicines for HIV or AIDS -aspirin and aspirin-like medicines -barbiturates like phenobarbital -certain medicines for blood pressure, heart disease, irregular heart beat -certain medicines for depression, anxiety, or psychotic disturbances -certain medicines for migraine headache like almotriptan, eletriptan, frovatriptan, naratriptan, rizatriptan, sumatriptan, zolmitriptan -certain medicines for seizures like carbamazepine and phenytoin -certain medicines for sleep -certain medicines that treat or prevent blood clots like dalteparin, enoxaparin, warfarin -digoxin -fentanyl -lithium -NSAIDS, medicines for pain and inflammation, like ibuprofen or naproxen -other medicines that prolong the QT interval (cause an abnormal heart rhythm) -rasagiline -supplements like St. John's wort, kava kava, valerian -tramadol -tryptophan This list may not describe all possible interactions. Give your health care provider a list of all the medicines, herbs, non-prescription drugs, or dietary supplements you use. Also tell them if you smoke, drink alcohol, or use  illegal drugs. Some items may interact with your medicine. What should I watch for while using this medicine? Tell your doctor if your symptoms do not get better or if they get worse. Visit your doctor or health care professional for regular checks on your progress. Because it may take several weeks to see the full effects of this medicine, it is important to continue your treatment as prescribed by your doctor. Patients and their families should watch out for new or worsening thoughts of suicide or depression. Also watch out for sudden changes in feelings such as feeling anxious, agitated, panicky, irritable, hostile,  aggressive, impulsive, severely restless, overly excited and hyperactive, or not being able to sleep. If this happens, especially at the beginning of treatment or after a change in dose, call your health care professional. Dennis Bast may get drowsy or dizzy. Do not drive, use machinery, or do anything that needs mental alertness until you know how this medicine affects you. Do not stand or sit up quickly, especially if you are an older patient. This reduces the risk of dizzy or fainting spells. Alcohol may interfere with the effect of this medicine. Avoid alcoholic drinks. This medicine may cause dry eyes and blurred vision. If you wear contact lenses you may feel some discomfort. Lubricating drops may help. See your eye doctor if the problem does not go away or is severe. Your mouth may get dry. Chewing sugarless gum, sucking hard candy and drinking plenty of water may help. Contact your doctor if the problem does not go away or is severe. What side effects may I notice from receiving this medicine? Side effects that you should report to your doctor or health care professional as soon as possible: -allergic reactions like skin rash, itching or hives, swelling of the face, lips, or tongue -fast, irregular heartbeat -feeling faint or lightheaded, falls -painful erections or other sexual dysfunction -suicidal thoughts or other mood changes -trembling Side effects that usually do not require medical attention (report to your doctor or health care professional if they continue or are bothersome): -constipation -headache -muscle aches or pains -nausea, vomiting -unusually weak or tired This list may not describe all possible side effects. Call your doctor for medical advice about side effects. You may report side effects to FDA at 1-800-FDA-1088. Where should I keep my medicine? Keep out of the reach of children. Store at room temperature between 15 and 30 degrees C (59 to 86 degrees F). Protect from light.  Keep container tightly closed. Throw away any unused medicine after the expiration date. NOTE: This sheet is a summary. It may not cover all possible information. If you have questions about this medicine, talk to your doctor, pharmacist, or health care provider.    2016, Elsevier/Gold Standard. (2012-11-07 15:46:28)

## 2015-09-04 NOTE — Assessment & Plan Note (Signed)
Trial of unisom PRN for poor sleep. Reviewed sleep hygiene. Consider trazodone.

## 2015-09-04 NOTE — Assessment & Plan Note (Signed)
Recheck vitamin D

## 2015-09-25 ENCOUNTER — Ambulatory Visit: Payer: Medicaid Other | Admitting: Dietician

## 2015-10-03 ENCOUNTER — Ambulatory Visit: Payer: Self-pay | Admitting: Physician Assistant

## 2015-10-03 ENCOUNTER — Encounter: Payer: Self-pay | Admitting: Physician Assistant

## 2015-10-03 VITALS — BP 120/70 | HR 86 | Temp 99.0°F

## 2015-10-03 DIAGNOSIS — S93602A Unspecified sprain of left foot, initial encounter: Secondary | ICD-10-CM

## 2015-10-03 DIAGNOSIS — IMO0002 Reserved for concepts with insufficient information to code with codable children: Secondary | ICD-10-CM

## 2015-10-03 DIAGNOSIS — G43709 Chronic migraine without aura, not intractable, without status migrainosus: Secondary | ICD-10-CM

## 2015-10-03 MED ORDER — SUMATRIPTAN SUCCINATE 100 MG PO TABS: 100.0000 mg | ORAL_TABLET | ORAL | Status: DC | PRN
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
## 2015-10-09 ENCOUNTER — Ambulatory Visit: Payer: Medicaid Other | Admitting: Family Medicine

## 2015-10-15 ENCOUNTER — Encounter: Payer: Self-pay | Admitting: Obstetrics and Gynecology

## 2015-11-05 ENCOUNTER — Ambulatory Visit: Payer: Medicaid Other | Admitting: Family Medicine

## 2015-11-06 ENCOUNTER — Encounter: Payer: Self-pay | Admitting: Obstetrics and Gynecology

## 2015-11-06 ENCOUNTER — Ambulatory Visit (INDEPENDENT_AMBULATORY_CARE_PROVIDER_SITE_OTHER): Payer: Managed Care, Other (non HMO) | Admitting: Obstetrics and Gynecology

## 2015-11-06 VITALS — BP 135/72 | HR 65 | Ht 63.0 in | Wt 184.9 lb

## 2015-11-06 DIAGNOSIS — N921 Excessive and frequent menstruation with irregular cycle: Secondary | ICD-10-CM | POA: Diagnosis not present

## 2015-11-06 DIAGNOSIS — D519 Vitamin B12 deficiency anemia, unspecified: Secondary | ICD-10-CM | POA: Insufficient documentation

## 2015-11-06 DIAGNOSIS — N946 Dysmenorrhea, unspecified: Secondary | ICD-10-CM

## 2015-11-06 DIAGNOSIS — D649 Anemia, unspecified: Secondary | ICD-10-CM | POA: Diagnosis not present

## 2015-11-06 DIAGNOSIS — N970 Female infertility associated with anovulation: Secondary | ICD-10-CM | POA: Diagnosis not present

## 2015-11-06 NOTE — Patient Instructions (Signed)
1.Endometrial biopsy is performed today. 2. Endometrial ablation is scheduled for 12/16/2015 3. Return week before surgery for preop appointment  Endometrial Biopsy, Care After Refer to this sheet in the next few weeks. These instructions provide you with information on caring for yourself after your procedure. Your health care provider may also give you more specific instructions. Your treatment has been planned according to current medical practices, but problems sometimes occur. Call your health care provider if you have any problems or questions after your procedure. WHAT TO EXPECT AFTER THE PROCEDURE After your procedure, it is typical to have the following:  You may have mild cramping and a small amount of vaginal bleeding for a few days after the procedure. This is normal. HOME CARE INSTRUCTIONS  Only take over-the-counter or prescription medicine as directed by your health care provider.  Do not douche, use tampons, or have sexual intercourse until your health care provider approves.  Follow your health care provider's instructions regarding any activity restrictions, such as strenuous exercise or heavy lifting. SEEK MEDICAL CARE IF:  You have heavy bleeding or bleeding longer than 2 days after the procedure.  You have bad smelling drainage from your vagina.  You have a fever and chills.  Youhave severe lower stomach (abdominal) pain. SEEK IMMEDIATE MEDICAL CARE IF:  You have severe cramps in your stomach or back.  You pass large blood clots.  Your bleeding increases.  You become weak or lightheaded, or you pass out.   This information is not intended to replace advice given to you by your health care provider. Make sure you discuss any questions you have with your health care provider.   Document Released: 01/25/2013 Document Reviewed: 01/25/2013 Elsevier Interactive Patient Education [employer].

## 2015-11-06 NOTE — Progress Notes (Signed)
GYN ENCOUNTER NOTE  Subjective:       Crystal Levine is a 50 y.o. [phone] female is here for gynecologic evaluation of the following issues:  1. Menorrhagia 2. Abnormal uterine bleeding 3. Chronic anemia.     cycles every 2 weeks with duration of flow 5 days associated with severe cramping requiring 800 mg ibuprofen 3 times a day. 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 History of severe dysmenorrhea with central cramping and perimenstrual low back ache without radiation. Previously scheduled for endometrial ablation in February 207 but moved from [place];03/2015 endometrial biopsy-benign  Gynecologic History Patient's last menstrual period was 10/11/2015.  Contraception: Vasectomy  Obstetric History OB History  Gravida Para Term Preterm AB SAB TAB Ectopic Multiple Living  5 3 3  2 2    3     # Outcome Date GA Lbr Len/2nd Weight Sex Delivery Anes PTL Lv  5 Term 2006   6 lb 2.1 oz (2.781 kg) F Vag-Spont   Y  4 SAB 2003          3 SAB 2003          2 Term 1999   8 lb 1.9 oz (3.683 kg) F Vag-Spont   Y  1 Term 1993   8 lb 2.1 oz (3.688 kg) M Vag-Spont   Y      Past Medical History  Diagnosis Date  . Anemia   . Migraine     In past    Past Surgical History  Procedure Laterality Date  . Gallbladder surgery  2003  . Gastric bypass  2004  . Hernia repair  2005  . Dilation and curettage of uterus  2003    2 Miscarriages    Current Outpatient Prescriptions on File Prior to Visit  Medication Sig Dispense Refill  . Cyanocobalamin (VITAMIN B-12 IJ) Inject 1,000 mcg as directed every 30 (thirty) days.    Marland Kitchen doxylamine, Sleep, (UNISOM) 25 MG tablet Take 1 tablet (25 mg total) by mouth at bedtime as needed. 30 tablet 1  . Magnesium 250 MG TABS Take 1 tablet (250 mg total) by mouth at bedtime. 30 tablet 11  . SUMAtriptan (IMITREX) 100 MG tablet Take 1 tablet (100 mg total) by mouth every 2 (two) hours as needed for  migraine. May repeat in 2 hours if headache persists or recurs. 10 tablet 4  . Vitamin D, Ergocalciferol, (DRISDOL) 50000 units CAPS capsule Take 1 capsule (50,000 Units total) by mouth every 7 (seven) days. 12 capsule 1   No current facility-administered medications on file prior to visit.    No Known Allergies  Social History   Social History  . Marital Status: Divorced    Spouse Name: N/A  . Number of Children: N/A  . Years of Education: N/A   Occupational History  . Not on file.   Social History Main Topics  . Smoking status: Never Smoker   . Smokeless tobacco: Not on file  . Alcohol Use: No  . Drug Use: No  . Sexual Activity: Yes     Comment: vasectomy   Other Topics Concern  . Not on file   Social History Narrative  Works for [employer]  Family History  Problem Relation Age of Onset  . Adopted: Yes  . Family history unknown: Yes    The following portions of the patient's history were reviewed and updated as appropriate: allergies, current medications, past family history,  past medical history, past social history, past surgical history and problem list.  Review of Systems Review of Systems -History of present illness  Objective:   BP 135/72 mmHg  Pulse 65  Ht 5\' 3"  (1.6 m)  Wt 184 lb 14.4 oz (83.87 kg)  BMI 32.76 kg/m2  LMP 10/11/2015 CONSTITUTIONAL: Well-developed, well-nourished female in no acute distress.  HENT:  Normocephalic, atraumatic.  NECK: Normal range of motion, supple, no masses.  Normal thyroid.  SKIN: Skin is warm and dry. No rash noted. Not diaphoretic. No erythema. No pallor. [place]: Alert and oriented to person, place, and time. PSYCHIATRIC: Normal mood and affect. Normal behavior. Normal judgment and thought content. CARDIOVASCULAR:Not Examined RESPIRATORY: Not Examined BREASTS: Not Examined ABDOMEN: Soft, non distended; Non tender.  No Organomegaly. PELVIC:  External Genitalia: Normal  BUS: Normal  Vagina:  Normal  Cervix: Normal; parous  Uterus: Normal size, shape,consistency, mobile; retroverted  Adnexa: Normal  RV: Normal  External exam  Bladder: Nontender MUSCULOSKELETAL: Normal range of motion. No tenderness.  No cyanosis, clubbing, or edema.   Endometrial Biopsy Procedure Note  Pre-operative Diagnosis: menorrhagia  Post-operative Diagnosis: menorrhagia   Procedure Details   Urine pregnancy test was not done.  The risks (including infection, bleeding, pain, and uterine perforation) and benefits of the procedure were explained to the patient and Verbal informed consent was obtained.  Antibiotic prophylaxis against endocarditis was not indicated.   The patient was placed in the dorsal lithotomy position.  Bimanual exam showed the uterus to be in the retroflexed position.  A Graves' speculum inserted in the vagina.   A sharp tenaculum was not applied to the anterior lip of the cervix for stabilization.  A sterile uterine sound was used to sound the uterus to a depth of 9cm.  A Mylex 56mm curette was used to sample the endometrium; abundant yellow tissue is obtained.  Sample was sent for pathologic examination.  Condition: Stable  Complications: None  Plan:  The patient was advised to call for any fever or for prolonged or severe pain or bleeding. She was advised to use OTC acetaminophen and OTC ibuprofen as needed for mild to moderate pain. She was advised to avoid vaginal intercourse for 48 hours or until the bleeding has completely stopped.  Attending Physician Documentation: Brayton Mars, MD   Assessment:   Menorrhagia Anovulatory bleeding Anemia   Plan:   1. Endometrial biopsy 2. Scheduled for endometrial ablation end of August  3. Return for preoperative appointment prior to surgery  Brayton Mars, MD  Note: This dictation was prepared with Dragon dictation along with smaller phrase technology. Any transcriptional errors that result from this  process are unintentional.

## 2015-11-10 LAB — PATHOLOGY

## 2015-11-12 ENCOUNTER — Telehealth: Payer: Self-pay

## 2015-11-12 NOTE — Telephone Encounter (Signed)
-----   Message from Brayton Mars, MD sent at 11/10/2015  8:06 PM EDT ----- Please Notify - Labs normal Endometrial Biopsy benign

## 2015-11-12 NOTE — Telephone Encounter (Signed)
Pt aware emb- neg. Text my chart sign up.

## 2015-11-14 ENCOUNTER — Telehealth: Payer: Self-pay | Admitting: Obstetrics and Gynecology

## 2015-11-14 MED ORDER — IBUPROFEN 800 MG PO TABS: 800.0000 mg | ORAL_TABLET | Freq: Three times a day (TID) | ORAL | 1 refills | Status: DC | PRN

## 2015-11-14 NOTE — Telephone Encounter (Signed)
Pt aware med erx. 

## 2015-11-14 NOTE — Telephone Encounter (Signed)
Pt started her period and she is in bad pain and wants to know if something can be called into [address] until her oblation is done

## 2015-11-15 ENCOUNTER — Other Ambulatory Visit: Payer: Managed Care, Other (non HMO)

## 2015-11-15 ENCOUNTER — Ambulatory Visit: Payer: Self-pay | Admitting: Family Medicine

## 2015-11-15 ENCOUNTER — Other Ambulatory Visit: Payer: Self-pay | Admitting: *Deleted

## 2015-11-15 ENCOUNTER — Other Ambulatory Visit: Payer: Self-pay | Admitting: Family Medicine

## 2015-11-15 DIAGNOSIS — E538 Deficiency of other specified B group vitamins: Secondary | ICD-10-CM

## 2015-11-15 DIAGNOSIS — E0590 Thyrotoxicosis, unspecified without thyrotoxic crisis or storm: Secondary | ICD-10-CM

## 2015-11-15 DIAGNOSIS — E559 Vitamin D deficiency, unspecified: Secondary | ICD-10-CM

## 2015-11-16 LAB — T3, FREE: T3 FREE: 2.4 pg/mL (ref 2.3–4.2)

## 2015-11-16 LAB — VITAMIN D 25 HYDROXY (VIT D DEFICIENCY, FRACTURES): Vit D, 25-Hydroxy: 21 ng/mL — ABNORMAL LOW (ref 30–100)

## 2015-11-16 LAB — T4, FREE: FREE T4: 1 ng/dL (ref 0.8–1.8)

## 2015-11-16 LAB — TSH: TSH: 0.38 mIU/L — ABNORMAL LOW

## 2015-11-16 LAB — VITAMIN B12: VITAMIN B 12: 183 pg/mL — AB (ref 200–1100)

## 2015-11-18 ENCOUNTER — Other Ambulatory Visit: Payer: Self-pay | Admitting: Family Medicine

## 2015-11-18 DIAGNOSIS — E559 Vitamin D deficiency, unspecified: Secondary | ICD-10-CM

## 2015-11-18 MED ORDER — VITAMIN D (ERGOCALCIFEROL) 1.25 MG (50000 UNIT) PO CAPS: 50000.0000 [IU] | ORAL_CAPSULE | ORAL | 1 refills | Status: DC

## 2015-11-27 ENCOUNTER — Ambulatory Visit: Payer: Self-pay

## 2015-12-11 ENCOUNTER — Encounter
Admission: RE | Admit: 2015-12-11 | Discharge: 2015-12-11 | Disposition: A | Discharge status: Home / Self Care | Payer: Managed Care, Other (non HMO) | Source: Ambulatory Visit | Attending: Obstetrics and Gynecology | Admitting: Obstetrics and Gynecology

## 2015-12-11 ENCOUNTER — Ambulatory Visit (INDEPENDENT_AMBULATORY_CARE_PROVIDER_SITE_OTHER): Payer: Managed Care, Other (non HMO) | Admitting: Obstetrics and Gynecology

## 2015-12-11 VITALS — BP 160/80 | HR 67 | Ht 63.0 in | Wt 179.5 lb

## 2015-12-11 DIAGNOSIS — N921 Excessive and frequent menstruation with irregular cycle: Secondary | ICD-10-CM

## 2015-12-11 DIAGNOSIS — Z01818 Encounter for other preprocedural examination: Secondary | ICD-10-CM

## 2015-12-11 DIAGNOSIS — D649 Anemia, unspecified: Secondary | ICD-10-CM

## 2015-12-11 DIAGNOSIS — Z01812 Encounter for preprocedural laboratory examination: Secondary | ICD-10-CM | POA: Diagnosis present

## 2015-12-11 HISTORY — DX: Major depressive disorder, single episode, unspecified: F32.9

## 2015-12-11 HISTORY — DX: Depression, unspecified: F32.A

## 2015-12-11 HISTORY — DX: Anxiety disorder, unspecified: F41.9

## 2015-12-11 LAB — RAPID HIV SCREEN (HIV 1/2 AB+AG)
HIV 1/2 ANTIBODIES: NONREACTIVE
HIV-1 P24 ANTIGEN - HIV24: NONREACTIVE

## 2015-12-11 LAB — CBC WITH DIFFERENTIAL/PLATELET
BASOS ABS: 0 10*3/uL (ref 0–0.1)
BASOS PCT: 1 %
EOS PCT: 3 %
Eosinophils Absolute: 0.1 10*3/uL (ref 0–0.7)
HCT: 34.8 % — ABNORMAL LOW (ref 35.0–47.0)
Hemoglobin: 11.1 g/dL — ABNORMAL LOW (ref 12.0–16.0)
LYMPHS PCT: 36 %
Lymphs Abs: 1.4 10*3/uL (ref 1.0–3.6)
MCH: 25.1 pg — ABNORMAL LOW (ref 26.0–34.0)
MCHC: 31.8 g/dL — ABNORMAL LOW (ref 32.0–36.0)
MCV: 78.9 fL — AB (ref 80.0–100.0)
Monocytes Absolute: 0.3 10*3/uL (ref 0.2–0.9)
Monocytes Relative: 7 %
Neutro Abs: 2.1 10*3/uL (ref 1.4–6.5)
Neutrophils Relative %: 53 %
PLATELETS: 206 10*3/uL (ref 150–440)
RBC: 4.41 MIL/uL (ref 3.80–5.20)
RDW: 15 % — ABNORMAL HIGH (ref 11.5–14.5)
WBC: 3.9 10*3/uL (ref 3.6–11.0)

## 2015-12-11 LAB — HM HIV SCREENING LAB: HM HIV Screening: NEGATIVE

## 2015-12-11 NOTE — Patient Instructions (Signed)
1.  Return in 1 week for postop check 

## 2015-12-11 NOTE — H&P (Signed)
Subjective:  PREOPERATIVE HISTORY AND PHYSICAL    Date of surgery: 12/16/2015  Chief complaint: 1. Menorrhagia 2. Abnormal uterine bleeding 3. Chronic anemia   Patient is a 50 y.o. [id number] scheduled for hysteroscopy/D&C with [facility] endometrial ablation on 12/16/2015.   Cycles every 2 weeks with duration of flow 5 days associated with severe cramping requiring 800 mg ibuprofen 3 times a day. 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 History of severe dysmenorrhea with central cramping and perimenstrual low back ache without radiation. Previously scheduled for endometrial ablation in February 207 but moved from [place];03/2015 endometrial biopsy-benign  11/06/2015 endometrial biopsy-pathology:   ENDOMETRIUM, BIOPSY:  EARLY SECRETORY ENDOMETRIUM. NO HYPERPLASIA OR CARCINOMA.  BSR/11/10/2015   Pertinent Gynecological History: LMP 10/11/2015 Contraception: Vasectomy   OB History    Gravida Para Term Preterm AB Living   5 3 3   2 3    SAB TAB Ectopic Multiple Live Births   2       3      Menarche age: NA Patient's last menstrual period was 11/11/2015.    Past Medical History:  Diagnosis Date  . Anemia   . Migraine    In past    Past Surgical History:  Procedure Laterality Date  . DILATION AND CURETTAGE OF UTERUS  2003   2 Miscarriages  . GALLBLADDER SURGERY  2003  . GASTRIC BYPASS  2004  . HERNIA REPAIR  2005    OB History  Gravida Para Term Preterm AB Living  5 3 3   2 3   SAB TAB Ectopic Multiple Live Births  2       3    # Outcome Date GA Lbr Len/2nd Weight Sex Delivery Anes PTL Lv  5 Term 2006   6 lb 2.1 oz (2.781 kg) F Vag-Spont   LIV  4 SAB 2003          3 SAB 2003          2 Term 1999   8 lb 1.9 oz (3.683 kg) F Vag-Spont   LIV  1 Term 1993   8 lb 2.1 oz (3.688 kg) M Vag-Spont   LIV      Social History   Social History  . Marital status: Divorced    Spouse name: N/A  . Number of  children: N/A  . Years of education: N/A   Social History Main Topics  . Smoking status: Never Smoker  . Smokeless tobacco: Not on file  . Alcohol use No  . Drug use: No  . Sexual activity: Yes     Comment: vasectomy   Other Topics Concern  . Not on file   Social History Narrative  . No narrative on file    Family History  Problem Relation Age of Onset  . Adopted: Yes  . Family history unknown: Yes     (Not in a hospital admission)  No Known Allergies  Review of Systems Constitutional: No recent fever/chills/sweats Respiratory: No recent cough/bronchitis Cardiovascular: No chest pain Gastrointestinal: No recent nausea/vomiting/diarrhea Genitourinary: No UTI symptoms Hematologic/lymphatic:No history of coagulopathy or recent blood thinner use    Objective:    BP (!) 160/80   Pulse 67   Ht 5\' 3"  (1.6 m)   Wt 179 lb 8 oz (81.4 kg)   LMP 11/11/2015   BMI 31.80 kg/m   General:   Normal  Skin:   normal  HEENT:  Normal  Neck:  Supple without  Adenopathy or Thyromegaly  Lungs:   Heart:              Breasts:   Abdomen:  Pelvis:  M/S   Extremeties:  Neuro:    clear to auscultation bilaterally   Normal without murmur   Not Examined   soft, non-tender; bowel sounds normal; no masses,  no organomegaly   Exam deferred to OR  No CVAT  Warm/Dry   Normal         11/06/2015 PELVIC:                       External Genitalia: Normal                       BUS: Normal                       Vagina: Normal                       Cervix: Normal; parous                       Uterus: Normal size, shape,consistency, mobile; retroverted                       Adnexa: Normal                       RV: Normal  External exam                       Bladder: Nontender Assessment:    1. Menorrhagia 2. Abnormal uterine bleeding 3. Chronic anemia 4. Benign endometrial biopsy   Plan:   Hysteroscopy/D&C with [facility] endometrial ablation  Counseling: The patient is to  undergo hysteroscopy/D&C with [facility] endometrial ablation age 65 2017. She is understanding of the planned procedure and is aware of and is accepting of all surgical risks which include but are not limited to bleeding, infection, pelvic organ injury with need for repair, blood clot disorders, anesthesia risks, failure of the procedure, etc. All questions have been answered. Informed consent is given. Patient is ready and willing to proceed with surgery as scheduled.  Brayton Mars, MD  Note: This dictation was prepared with Dragon dictation along with smaller phrase technology. Any transcriptional errors that result from this process are unintentional.

## 2015-12-11 NOTE — Progress Notes (Signed)
Subjective:  PREOPERATIVE HISTORY AND PHYSICAL    Date of surgery: 12/16/2015  Chief complaint: 1. Menorrhagia 2. Abnormal uterine bleeding 3. Chronic anemia   Patient is a 50 y.o. G5P3060female scheduled for hysteroscopy/D&C with NovaSure endometrial ablation on 12/16/2015.   Cycles every 2 weeks with duration of flow 5 days associated with severe cramping requiring 800 mg ibuprofen 3 times a day. 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 History of severe dysmenorrhea with central cramping and perimenstrual low back ache without radiation. Previously scheduled for endometrial ablation in February 207 but moved from [place];03/2015 endometrial biopsy-benign  11/06/2015 endometrial biopsy-pathology:   ENDOMETRIUM, BIOPSY:  EARLY SECRETORY ENDOMETRIUM. NO HYPERPLASIA OR CARCINOMA.  BSR/11/10/2015   Pertinent Gynecological History: LMP 10/11/2015 Contraception: Vasectomy   OB History    Gravida Para Term Preterm AB Living   5 3 3   2 3    SAB TAB Ectopic Multiple Live Births   2       3      Menarche age: NA Patient's last menstrual period was 11/11/2015.    Past Medical History:  Diagnosis Date  . Anemia   . Migraine    In past    Past Surgical History:  Procedure Laterality Date  . DILATION AND CURETTAGE OF UTERUS  2003   2 Miscarriages  . GALLBLADDER SURGERY  2003  . GASTRIC BYPASS  2004  . HERNIA REPAIR  2005    OB History  Gravida Para Term Preterm AB Living  5 3 3   2 3   SAB TAB Ectopic Multiple Live Births  2       3    # Outcome Date GA Lbr Len/2nd Weight Sex Delivery Anes PTL Lv  5 Term 2006   6 lb 2.1 oz (2.781 kg) F Vag-Spont   LIV  4 SAB 2003          3 SAB 2003          2 Term 1999   8 lb 1.9 oz (3.683 kg) F Vag-Spont   LIV  1 Term 1993   8 lb 2.1 oz (3.688 kg) M Vag-Spont   LIV      Social History   Social History  . Marital status: Divorced    Spouse name: N/A  . Number of  children: N/A  . Years of education: N/A   Social History Main Topics  . Smoking status: Never Smoker  . Smokeless tobacco: Not on file  . Alcohol use No  . Drug use: No  . Sexual activity: Yes     Comment: vasectomy   Other Topics Concern  . Not on file   Social History Narrative  . No narrative on file    Family History  Problem Relation Age of Onset  . Adopted: Yes  . Family history unknown: Yes     (Not in a hospital admission)  No Known Allergies  Review of Systems Constitutional: No recent fever/chills/sweats Respiratory: No recent cough/bronchitis Cardiovascular: No chest pain Gastrointestinal: No recent nausea/vomiting/diarrhea Genitourinary: No UTI symptoms Hematologic/lymphatic:No history of coagulopathy or recent blood thinner use    Objective:    BP (!) 160/80   Pulse 67   Ht 5\' 3"  (1.6 m)   Wt 179 lb 8 oz (81.4 kg)   LMP 11/11/2015   BMI 31.80 kg/m   General:   Normal  Skin:   normal  HEENT:  Normal  Neck:  Supple without  Adenopathy or Thyromegaly  Lungs:   Heart:              Breasts:   Abdomen:  Pelvis:  M/S   Extremeties:  Neuro:    clear to auscultation bilaterally   Normal without murmur   Not Examined   soft, non-tender; bowel sounds normal; no masses,  no organomegaly   Exam deferred to OR  No CVAT  Warm/Dry   Normal         11/06/2015 PELVIC:                       External Genitalia: Normal                       BUS: Normal                       Vagina: Normal                       Cervix: Normal; parous                       Uterus: Normal size, shape,consistency, mobile; retroverted                       Adnexa: Normal                       RV: Normal  External exam                       Bladder: Nontender Assessment:    1. Menorrhagia 2. Abnormal uterine bleeding 3. Chronic anemia 4. Benign endometrial biopsy   Plan:   Hysteroscopy/D&C with NovaSure endometrial ablation  Counseling: The patient is to  undergo hysteroscopy/D&C with NovaSure endometrial ablation age 63 2017. She is understanding of the planned procedure and is aware of and is accepting of all surgical risks which include but are not limited to bleeding, infection, pelvic organ injury with need for repair, blood clot disorders, anesthesia risks, failure of the procedure, etc. All questions have been answered. Informed consent is given. Patient is ready and willing to proceed with surgery as scheduled.  Brayton Mars, MD  Note: This dictation was prepared with Dragon dictation along with smaller phrase technology. Any transcriptional errors that result from this process are unintentional.

## 2015-12-11 NOTE — Patient Instructions (Signed)
Your procedure is scheduled on: Monday 12/16/15 Report to Day Surgery. 2ND FLOOR MEDICAL MALL ENTRANCE To find out your arrival time please call [phone] between 1PM - 3PM on Friday 12/13/15.  Remember: Instructions that are not followed completely may result in serious medical risk, up to and including death, or upon the discretion of your surgeon and anesthesiologist your surgery may need to be rescheduled.    __X__ 1. Do not eat food or drink liquids after midnight. No gum chewing or hard candies.     __X__ 2. No Alcohol/SMOKING for 24 hours before or after surgery.   ____ 3. Bring all medications with you on the day of surgery if instructed.    __X__ 4. Notify your doctor if there is any change in your medical condition     (cold, fever, infections).     Do not wear jewelry, make-up, hairpins, clips or nail polish.  Do not wear lotions, powders, or perfumes.   Do not shave 48 hours prior to surgery. Men may shave face and neck.  Do not bring valuables to the hospital.    [facility] is not responsible for any belongings or valuables.               Contacts, dentures or bridgework may not be worn into surgery.  Leave your suitcase in the car. After surgery it may be brought to your room.  For patients admitted to the hospital, discharge time is determined by your                treatment team.   Patients discharged the day of surgery will not be allowed to drive home.   Please read over the following fact sheets that you were given:   Surgical Site Infection Prevention   ____ Take these medicines the morning of surgery with A SIP OF WATER:    1. NONE  2.   3.   4.  5.  6.  ____ Fleet Enema (as directed)   ____ Use CHG Soap as directed  ____ Use inhalers on the day of surgery  ____ Stop metformin 2 days prior to surgery    ____ Take 1/2 of usual insulin dose the night before surgery and none on the morning of surgery.   ____ Stop Coumadin/Plavix/aspirin on    __X__ Stop Anti-inflammatories on STOP IBUPROFEN MAY TAKE TYLENOL FOR PAIN   __X__ Stop supplements until after surgery.    ____ Bring C-Pap to the hospital.

## 2015-12-12 LAB — RPR: RPR: NONREACTIVE

## 2015-12-16 ENCOUNTER — Ambulatory Visit
Admission: RE | Admit: 2015-12-16 | Discharge: 2015-12-16 | Disposition: A | Discharge status: Home / Self Care | Payer: Managed Care, Other (non HMO) | Source: Ambulatory Visit | Attending: Obstetrics and Gynecology | Admitting: Obstetrics and Gynecology

## 2015-12-16 ENCOUNTER — Ambulatory Visit: Payer: Managed Care, Other (non HMO) | Admitting: Anesthesiology

## 2015-12-16 ENCOUNTER — Encounter: Payer: Self-pay | Admitting: *Deleted

## 2015-12-16 ENCOUNTER — Encounter
Admission: RE | Disposition: A | Discharge status: Home / Self Care | Payer: Self-pay | Source: Ambulatory Visit | Attending: Obstetrics and Gynecology

## 2015-12-16 DIAGNOSIS — N920 Excessive and frequent menstruation with regular cycle: Secondary | ICD-10-CM | POA: Diagnosis present

## 2015-12-16 DIAGNOSIS — N946 Dysmenorrhea, unspecified: Secondary | ICD-10-CM | POA: Insufficient documentation

## 2015-12-16 DIAGNOSIS — N970 Female infertility associated with anovulation: Secondary | ICD-10-CM | POA: Diagnosis not present

## 2015-12-16 DIAGNOSIS — G43909 Migraine, unspecified, not intractable, without status migrainosus: Secondary | ICD-10-CM | POA: Diagnosis not present

## 2015-12-16 DIAGNOSIS — D649 Anemia, unspecified: Secondary | ICD-10-CM | POA: Diagnosis not present

## 2015-12-16 DIAGNOSIS — Z9884 Bariatric surgery status: Secondary | ICD-10-CM | POA: Insufficient documentation

## 2015-12-16 DIAGNOSIS — Z9049 Acquired absence of other specified parts of digestive tract: Secondary | ICD-10-CM | POA: Insufficient documentation

## 2015-12-16 DIAGNOSIS — N921 Excessive and frequent menstruation with irregular cycle: Secondary | ICD-10-CM

## 2015-12-16 HISTORY — PX: DILITATION & CURRETTAGE/HYSTROSCOPY WITH NOVASURE ABLATION: SHX5568

## 2015-12-16 SURGERY — DILATATION & CURETTAGE/HYSTEROSCOPY WITH NOVASURE ABLATION
Anesthesia: General

## 2015-12-16 MED ORDER — MIDAZOLAM HCL 2 MG/2ML IJ SOLN
INTRAMUSCULAR | Status: DC | PRN
  Administered 2015-12-16: 2 mg via INTRAVENOUS

## 2015-12-16 MED ORDER — KETOROLAC TROMETHAMINE 30 MG/ML IJ SOLN
INTRAMUSCULAR | Status: AC
  Administered 2015-12-16: 30 mg via INTRAVENOUS
  Filled 2015-12-16: qty 1

## 2015-12-16 MED ORDER — DEXAMETHASONE SODIUM PHOSPHATE 10 MG/ML IJ SOLN
INTRAMUSCULAR | Status: DC | PRN
  Administered 2015-12-16: 10 mg via INTRAVENOUS

## 2015-12-16 MED ORDER — ONDANSETRON HCL 4 MG/2ML IJ SOLN
INTRAMUSCULAR | Status: AC
  Administered 2015-12-16: 4 mg via INTRAVENOUS
  Filled 2015-12-16: qty 2

## 2015-12-16 MED ORDER — OXYCODONE-ACETAMINOPHEN 5-325 MG PO TABS
ORAL_TABLET | ORAL | Status: AC
  Administered 2015-12-16: 1 via ORAL
  Filled 2015-12-16: qty 1

## 2015-12-16 MED ORDER — FENTANYL CITRATE (PF) 100 MCG/2ML IJ SOLN
INTRAMUSCULAR | Status: AC
  Administered 2015-12-16: 25 ug via INTRAVENOUS
  Filled 2015-12-16: qty 2

## 2015-12-16 MED ORDER — LIDOCAINE HCL (CARDIAC) 20 MG/ML IV SOLN
INTRAVENOUS | Status: DC | PRN
  Administered 2015-12-16: 30 mg via INTRAVENOUS

## 2015-12-16 MED ORDER — PROMETHAZINE HCL 25 MG/ML IJ SOLN
6.2500 mg | Freq: Once | INTRAMUSCULAR | Status: AC
  Administered 2015-12-16: 6.25 mg via INTRAVENOUS

## 2015-12-16 MED ORDER — GLYCOPYRROLATE 0.2 MG/ML IJ SOLN
INTRAMUSCULAR | Status: DC | PRN
  Administered 2015-12-16: 0.2 mg via INTRAVENOUS

## 2015-12-16 MED ORDER — IBUPROFEN 800 MG PO TABS: 800.0000 mg | ORAL_TABLET | Freq: Three times a day (TID) | ORAL | 1 refills | Status: DC

## 2015-12-16 MED ORDER — SODIUM CHLORIDE 0.9 % IJ SOLN
INTRAMUSCULAR | Status: DC
Start: 2015-12-16 — End: 2015-12-16
  Filled 2015-12-16: qty 10

## 2015-12-16 MED ORDER — KETOROLAC TROMETHAMINE 30 MG/ML IJ SOLN
30.0000 mg | Freq: Once | INTRAMUSCULAR | Status: AC
  Administered 2015-12-16: 30 mg via INTRAVENOUS

## 2015-12-16 MED ORDER — PROPOFOL 10 MG/ML IV BOLUS
INTRAVENOUS | Status: DC | PRN
  Administered 2015-12-16: 130 mg via INTRAVENOUS

## 2015-12-16 MED ORDER — ONDANSETRON HCL 4 MG/2ML IJ SOLN
4.0000 mg | Freq: Once | INTRAMUSCULAR | Status: AC | PRN
  Administered 2015-12-16: 4 mg via INTRAVENOUS

## 2015-12-16 MED ORDER — OXYCODONE-ACETAMINOPHEN 5-325 MG PO TABS
1.0000 | ORAL_TABLET | ORAL | Status: DC | PRN
  Administered 2015-12-16: 1 via ORAL

## 2015-12-16 MED ORDER — FENTANYL CITRATE (PF) 100 MCG/2ML IJ SOLN
25.0000 ug | INTRAMUSCULAR | Status: DC | PRN
  Administered 2015-12-16 (×4): 25 ug via INTRAVENOUS

## 2015-12-16 MED ORDER — PROMETHAZINE HCL 25 MG/ML IJ SOLN
INTRAMUSCULAR | Status: AC
  Administered 2015-12-16: 6.25 mg via INTRAVENOUS
  Filled 2015-12-16: qty 1

## 2015-12-16 MED ORDER — OXYCODONE-ACETAMINOPHEN 5-325 MG PO TABS: 1.0000 | ORAL_TABLET | ORAL | 0 refills | Status: DC | PRN

## 2015-12-16 MED ORDER — ONDANSETRON HCL 4 MG/2ML IJ SOLN
INTRAMUSCULAR | Status: DC | PRN
  Administered 2015-12-16: 4 mg via INTRAVENOUS

## 2015-12-16 MED ORDER — FAMOTIDINE 20 MG PO TABS
20.0000 mg | ORAL_TABLET | Freq: Once | ORAL | Status: AC
Start: 2015-12-16 — End: 2015-12-16
  Administered 2015-12-16: 20 mg via ORAL

## 2015-12-16 MED ORDER — FAMOTIDINE 20 MG PO TABS
ORAL_TABLET | ORAL | Status: AC
  Administered 2015-12-16: 20 mg via ORAL
  Filled 2015-12-16: qty 1

## 2015-12-16 MED ORDER — FENTANYL CITRATE (PF) 100 MCG/2ML IJ SOLN
INTRAMUSCULAR | Status: DC | PRN
  Administered 2015-12-16 (×2): 50 ug via INTRAVENOUS

## 2015-12-16 MED ORDER — LACTATED RINGERS IV SOLN
INTRAVENOUS | Status: DC
  Administered 2015-12-16: 09:00:00 via INTRAVENOUS

## 2015-12-16 SURGICAL SUPPLY — 12 items
CATH ROBINSON RED A/P 16FR (CATHETERS) ×2 IMPLANT
GLOVE BIO SURGEON STRL SZ8 (GLOVE) ×8 IMPLANT
GOWN STRL REUS W/ TWL LRG LVL3 (GOWN DISPOSABLE) ×1 IMPLANT
GOWN STRL REUS W/ TWL XL LVL3 (GOWN DISPOSABLE) ×1 IMPLANT
GOWN STRL REUS W/TWL LRG LVL3 (GOWN DISPOSABLE) ×1
GOWN STRL REUS W/TWL XL LVL3 (GOWN DISPOSABLE) ×1
IV LACTATED RINGERS 1000ML (IV SOLUTION) ×2 IMPLANT
KIT RM TURNOVER CYSTO AR (KITS) ×2 IMPLANT
NOVASURE ENDOMETRIAL ABLATION (MISCELLANEOUS) ×2 IMPLANT
PACK DNC HYST (MISCELLANEOUS) ×2 IMPLANT
PAD OB MATERNITY 4.3X12.25 (PERSONAL CARE ITEMS) ×2 IMPLANT
PAD PREP 24X41 OB/GYN DISP (PERSONAL CARE ITEMS) ×2 IMPLANT

## 2015-12-16 NOTE — Anesthesia Preprocedure Evaluation (Signed)
Anesthesia Evaluation  Patient identified by MRN, date of birth, ID band Patient awake    Reviewed: Allergy & Precautions, NPO status , Patient's Chart, lab work & pertinent test results, reviewed documented beta blocker date and time   Airway Mallampati: II  TM Distance: >3 FB     Dental  (+) Chipped   Pulmonary           Cardiovascular      Neuro/Psych PSYCHIATRIC DISORDERS Anxiety Depression    GI/Hepatic   Endo/Other  Hyperthyroidism   Renal/GU      Musculoskeletal   Abdominal   Peds  Hematology  (+) anemia ,   Anesthesia Other Findings Gastric bypass.  Reproductive/Obstetrics                             Anesthesia Physical Anesthesia Plan  ASA: II  Anesthesia Plan: General   Post-op Pain Management:    Induction: Intravenous  Airway Management Planned: LMA  Additional Equipment:   Intra-op Plan:   Post-operative Plan:   Informed Consent: I have reviewed the patients History and Physical, chart, labs and discussed the procedure including the risks, benefits and alternatives for the proposed anesthesia with the patient or authorized representative who has indicated his/her understanding and acceptance.     Plan Discussed with: CRNA  Anesthesia Plan Comments:         Anesthesia Quick Evaluation

## 2015-12-16 NOTE — H&P (View-Only) (Signed)
Subjective:  PREOPERATIVE HISTORY AND PHYSICAL    Date of surgery: 12/16/2015  Chief complaint: 1. Menorrhagia 2. Abnormal uterine bleeding 3. Chronic anemia   Patient is a 50 y.o. G5P3089female scheduled for hysteroscopy/D&C with NovaSure endometrial ablation on 12/16/2015.   Cycles every 2 weeks with duration of flow 5 days associated with severe cramping requiring 800 mg ibuprofen 3 times a day. 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 History of severe dysmenorrhea with central cramping and perimenstrual low back ache without radiation. Previously scheduled for endometrial ablation in February 207 but moved from [place];03/2015 endometrial biopsy-benign  11/06/2015 endometrial biopsy-pathology:   ENDOMETRIUM, BIOPSY:  EARLY SECRETORY ENDOMETRIUM. NO HYPERPLASIA OR CARCINOMA.  BSR/11/10/2015   Pertinent Gynecological History: LMP 10/11/2015 Contraception: Vasectomy   OB History    Gravida Para Term Preterm AB Living   5 3 3   2 3    SAB TAB Ectopic Multiple Live Births   2       3      Menarche age: NA Patient's last menstrual period was 11/11/2015.    Past Medical History:  Diagnosis Date  . Anemia   . Migraine    In past    Past Surgical History:  Procedure Laterality Date  . DILATION AND CURETTAGE OF UTERUS  2003   2 Miscarriages  . GALLBLADDER SURGERY  2003  . GASTRIC BYPASS  2004  . HERNIA REPAIR  2005    OB History  Gravida Para Term Preterm AB Living  5 3 3   2 3   SAB TAB Ectopic Multiple Live Births  2       3    # Outcome Date GA Lbr Len/2nd Weight Sex Delivery Anes PTL Lv  5 Term 2006   6 lb 2.1 oz (2.781 kg) F Vag-Spont   LIV  4 SAB 2003          3 SAB 2003          2 Term 1999   8 lb 1.9 oz (3.683 kg) F Vag-Spont   LIV  1 Term 1993   8 lb 2.1 oz (3.688 kg) M Vag-Spont   LIV      Social History   Social History  . Marital status: Divorced    Spouse name: N/A  . Number of  children: N/A  . Years of education: N/A   Social History Main Topics  . Smoking status: Never Smoker  . Smokeless tobacco: Not on file  . Alcohol use No  . Drug use: No  . Sexual activity: Yes     Comment: vasectomy   Other Topics Concern  . Not on file   Social History Narrative  . No narrative on file    Family History  Problem Relation Age of Onset  . Adopted: Yes  . Family history unknown: Yes     (Not in a hospital admission)  No Known Allergies  Review of Systems Constitutional: No recent fever/chills/sweats Respiratory: No recent cough/bronchitis Cardiovascular: No chest pain Gastrointestinal: No recent nausea/vomiting/diarrhea Genitourinary: No UTI symptoms Hematologic/lymphatic:No history of coagulopathy or recent blood thinner use    Objective:    BP (!) 160/80   Pulse 67   Ht 5\' 3"  (1.6 m)   Wt 179 lb 8 oz (81.4 kg)   LMP 11/11/2015   BMI 31.80 kg/m   General:   Normal  Skin:   normal  HEENT:  Normal  Neck:  Supple without  Adenopathy or Thyromegaly  Lungs:   Heart:              Breasts:   Abdomen:  Pelvis:  M/S   Extremeties:  Neuro:    clear to auscultation bilaterally   Normal without murmur   Not Examined   soft, non-tender; bowel sounds normal; no masses,  no organomegaly   Exam deferred to OR  No CVAT  Warm/Dry   Normal         11/06/2015 PELVIC:                       External Genitalia: Normal                       BUS: Normal                       Vagina: Normal                       Cervix: Normal; parous                       Uterus: Normal size, shape,consistency, mobile; retroverted                       Adnexa: Normal                       RV: Normal  External exam                       Bladder: Nontender Assessment:    1. Menorrhagia 2. Abnormal uterine bleeding 3. Chronic anemia 4. Benign endometrial biopsy   Plan:   Hysteroscopy/D&C with NovaSure endometrial ablation  Counseling: The patient is to  undergo hysteroscopy/D&C with NovaSure endometrial ablation age 57 2015. She is understanding of the planned procedure and is aware of and is accepting of all surgical risks which include but are not limited to bleeding, infection, pelvic organ injury with need for repair, blood clot disorders, anesthesia risks, failure of the procedure, etc. All questions have been answered. Informed consent is given. Patient is ready and willing to proceed with surgery as scheduled.  Brayton Mars, MD  Note: This dictation was prepared with Dragon dictation along with smaller phrase technology. Any transcriptional errors that result from this process are unintentional.

## 2015-12-16 NOTE — Anesthesia Procedure Notes (Signed)
Procedure Name: LMA Insertion Date/Time: 12/16/2015 9:32 AM Performed by: Jonna Clark Pre-anesthesia Checklist: Patient identified, Patient being monitored, Timeout performed, Emergency Drugs available and Suction available Patient Re-evaluated:Patient Re-evaluated prior to inductionOxygen Delivery Method: Circle system utilized Preoxygenation: Pre-oxygenation with 100% oxygen Intubation Type: IV induction Ventilation: Mask ventilation without difficulty LMA: LMA inserted LMA Size: 4.0 Tube type: Oral Number of attempts: 1 Placement Confirmation: positive ETCO2 and breath sounds checked- equal and bilateral Tube secured with: Tape Dental Injury: Teeth and Oropharynx as per pre-operative assessment

## 2015-12-16 NOTE — Op Note (Signed)
OPERATIVE NOTE  Date of surgery: 12/16/2015  Preoperative diagnosis:  1. Menorrhagia 2. History of benign endometrial biopsy  Postoperative diagnosis:  1. Menorrhagia 2. History of benign endometrial biopsy  Operative procedure: 1. Hysteroscopy, diagnostic 2. Dilation and curettage of endometrium 3. NovaSure endometrial ablation  Surgeon: Dr. Zipporah Plants Assistant: None Anesthesia: Gen.   Findings: Pelvis: Gynecoid; good uterine descensus with tenaculum traction of cervix (excellent TVH candidate) Uterus: sounded to 9 cm; cervical length 4 cm; cavity width 4.5 cm Normal appearing endometrial cavity without gross lesions.  Description of procedure Patient was brought to the operating room where she was placed in supine position. General anesthesia was induced without difficulty. The patient was placed in dorsal lithotomy position using candy cane stirrups. A betadine perineal intravaginal prep and drape was performed in standard fashion. Weighted speculum was placed in the vagina. Single-tooth tenaculum was placed on the anterior cervix. Uterus was sounded to 9 cm. Hegar dilators were used up to an 8 [place] caliber to dilate the endocervical canal. The [facility] hysteroscope using lactated Ringer's as irrigant was used for the hysteroscopy. The above-noted findings were noted. The hysteroscope removed. The smooth and serrated curettes were then used to curettage the endometrial cavity with production of average tissue. The endometrial tissue was sent to pathology. The NovaSure endometrial ablation instrument was then opened and inserted into the vagina and cervix in standard fashion. Cavity test was completed no evidence of leak. The NovaSure instrument was then engaged and the ablation was performed over 1 minute 12 seconds. The instrument was removed. Repeat hysteroscopy was performed and demonstrated excellent char and thermal effect. Procedure was then terminated with all instrumentation  being removed from the vagina. The patient was then awakened mobilized and taken to the recovery room in satisfactory condition.  IV fluids: See anesthesia flow sheet Urine output: 100 mL. EBL: Minimal mL.  Instruments, needles, and sponge counts were verified as correct.  Brayton Mars, MD 12/16/2015

## 2015-12-16 NOTE — Interval H&P Note (Signed)
History and Physical Interval Note:  12/16/2015 9:02 AM  Crystal Levine  has presented today for surgery, with the diagnosis of menorrhagia, anovulatory bleeding, dysmenorrhea, anemia  The various methods of treatment have been discussed with the patient and family. After consideration of risks, benefits and other options for treatment, the patient has consented to  Procedure(s): [place] (N/A) as a surgical intervention .  The patient's history has been reviewed, patient examined, no change in status, stable for surgery.  I have reviewed the patient's chart and labs.  Questions were answered to the patient's satisfaction.     Hassell Done A Ethelene Closser

## 2015-12-16 NOTE — Anesthesia Postprocedure Evaluation (Signed)
Anesthesia Post Note  Patient: Crystal Levine  Procedure(s) Performed: Procedure(s) (LRB): DILATATION & CURETTAGE/HYSTEROSCOPY WITH NOVASURE ABLATION (N/A)  Patient location during evaluation: PACU Anesthesia Type: General Level of consciousness: awake and alert Pain management: pain level controlled Vital Signs Assessment: post-procedure vital signs reviewed and stable Respiratory status: spontaneous breathing, nonlabored ventilation, respiratory function stable and patient connected to nasal cannula oxygen Cardiovascular status: blood pressure returned to baseline and stable Postop Assessment: no signs of nausea or vomiting Anesthetic complications: no    Last Vitals:  Vitals:   12/16/15 1119 12/16/15 1141  BP: 131/85 135/86  Pulse: 62 (!) 51  Resp: 16 16  Temp: 36.8 C     Last Pain:  Vitals:   12/16/15 1141  TempSrc:   PainSc: [place]

## 2015-12-16 NOTE — Transfer of Care (Signed)
Immediate Anesthesia Transfer of Care Note  Patient: Crystal Levine  Procedure(s) Performed: Procedure(s): DILATATION & CURETTAGE/HYSTEROSCOPY WITH NOVASURE ABLATION (N/A)  Patient Location: PACU  Anesthesia Type:General  Level of Consciousness: patient cooperative and lethargic  Airway & Oxygen Therapy: Patient Spontanous Breathing and Patient connected to face mask oxygen  Post-op Assessment: Report given to RN and Post -op Vital signs reviewed and stable  Post vital signs: Reviewed and stable  Last Vitals:  Vitals:   12/16/15 0912 12/16/15 1019  BP: 127/83 (!) 147/92  Pulse: 69 95  Resp: 16 15  Temp: 36.8 C 36.3 C    Last Pain:  Vitals:   12/16/15 0912  TempSrc: Oral         Complications: No apparent anesthesia complications

## 2015-12-16 NOTE — Discharge Instructions (Signed)

## 2015-12-17 LAB — SURGICAL PATHOLOGY

## 2015-12-18 ENCOUNTER — Telehealth: Payer: Self-pay | Admitting: Obstetrics and Gynecology

## 2015-12-18 NOTE — Telephone Encounter (Signed)
Letter left up front for p/u. Pt aware per my chart message.

## 2015-12-18 NOTE — Telephone Encounter (Signed)
PT CALLED AND SHE HAD SURGERY ON Monday AND DR DE GAVE HER A NOTE EXCUSING HER FROM WORK ON Monday AND TUESDAY, BUT SHE NEEDS ANOTHER DAY SO SHE WANTED TO GET A NOTE FORM DR DE EXCUSING HER FROM WORK TODAY Wednesday 12/18/15, PT STATED TO CALL HER WHEN ITS READY TO BE PICKED UP.

## 2015-12-26 ENCOUNTER — Encounter: Payer: Self-pay | Admitting: Obstetrics and Gynecology

## 2015-12-31 ENCOUNTER — Ambulatory Visit (INDEPENDENT_AMBULATORY_CARE_PROVIDER_SITE_OTHER): Payer: Managed Care, Other (non HMO) | Admitting: Obstetrics and Gynecology

## 2015-12-31 VITALS — BP 122/80 | HR 69 | Ht 63.0 in | Wt 183.5 lb

## 2015-12-31 DIAGNOSIS — N921 Excessive and frequent menstruation with irregular cycle: Secondary | ICD-10-CM

## 2015-12-31 DIAGNOSIS — D649 Anemia, unspecified: Secondary | ICD-10-CM

## 2015-12-31 DIAGNOSIS — Z09 Encounter for follow-up examination after completed treatment for conditions other than malignant neoplasm: Secondary | ICD-10-CM

## 2015-12-31 DIAGNOSIS — Z9889 Other specified postprocedural states: Secondary | ICD-10-CM

## 2015-12-31 NOTE — Patient Instructions (Signed)
1. Resume activities as tolerated 2. Maintain menstrual calendar monitoring for any abnormal uterine bleeding 3. Return in 3 months for follow-up

## 2015-12-31 NOTE — Progress Notes (Signed)
Chief complaint: 1. 2 weeks status post hysteroscopy/D&C with NovaSure endometrial ablation 2. Postop check  Patient is doing well this time with no significant vaginal discharge, vaginal bleeding, or pelvic pain. The early postop timeframe (0000000 days) was complicated by severe pelvic cramping and light watery discharge ibuprofen and Tylenol helps control symptoms.  Past medical history, past surgical history, problem list, medications, and allergies are reviewed  OBJECTIVE: BP 122/80   Pulse 69   Ht 5\' 3"  (1.6 m)   Wt 183 lb 8 oz (83.2 kg)   LMP 12/16/2015   BMI 32.51 kg/m  Physical exam-deferred  ASSESSMENT: 1. Normal postop check 2 weeks status post hysteroscopy/D&C with NovaSure endometrial ablation  PLAN: 1. Resume activities as tolerated 2. Maintain menstrual calendar monitoring for any abnormal uterine bleeding 3. Return in 3 months for follow-up  Brayton Mars, MD  Note: This dictation was prepared with Dragon dictation along with smaller phrase technology. Any transcriptional errors that result from this process are unintentional.

## 2016-01-20 ENCOUNTER — Other Ambulatory Visit: Payer: Medicaid Other

## 2016-01-22 ENCOUNTER — Ambulatory Visit: Payer: Medicaid Other | Admitting: Internal Medicine

## 2016-01-22 ENCOUNTER — Ambulatory Visit: Payer: Medicaid Other

## 2016-01-27 ENCOUNTER — Inpatient Hospital Stay: Payer: Managed Care, Other (non HMO) | Attending: Internal Medicine

## 2016-01-27 ENCOUNTER — Other Ambulatory Visit: Payer: Self-pay

## 2016-01-27 DIAGNOSIS — Z79899 Other long term (current) drug therapy: Secondary | ICD-10-CM | POA: Diagnosis not present

## 2016-01-27 DIAGNOSIS — R5383 Other fatigue: Secondary | ICD-10-CM | POA: Diagnosis not present

## 2016-01-27 DIAGNOSIS — R252 Cramp and spasm: Secondary | ICD-10-CM | POA: Insufficient documentation

## 2016-01-27 DIAGNOSIS — E538 Deficiency of other specified B group vitamins: Secondary | ICD-10-CM | POA: Diagnosis not present

## 2016-01-27 DIAGNOSIS — F5089 Other specified eating disorder: Secondary | ICD-10-CM | POA: Insufficient documentation

## 2016-01-27 DIAGNOSIS — G629 Polyneuropathy, unspecified: Secondary | ICD-10-CM | POA: Diagnosis not present

## 2016-01-27 DIAGNOSIS — D509 Iron deficiency anemia, unspecified: Secondary | ICD-10-CM | POA: Insufficient documentation

## 2016-01-27 DIAGNOSIS — N920 Excessive and frequent menstruation with regular cycle: Secondary | ICD-10-CM | POA: Insufficient documentation

## 2016-01-27 DIAGNOSIS — Z9884 Bariatric surgery status: Secondary | ICD-10-CM | POA: Insufficient documentation

## 2016-01-27 DIAGNOSIS — F329 Major depressive disorder, single episode, unspecified: Secondary | ICD-10-CM | POA: Insufficient documentation

## 2016-01-27 DIAGNOSIS — M791 Myalgia: Secondary | ICD-10-CM | POA: Insufficient documentation

## 2016-01-27 DIAGNOSIS — F419 Anxiety disorder, unspecified: Secondary | ICD-10-CM | POA: Diagnosis not present

## 2016-01-27 DIAGNOSIS — Z8669 Personal history of other diseases of the nervous system and sense organs: Secondary | ICD-10-CM | POA: Insufficient documentation

## 2016-01-27 DIAGNOSIS — E559 Vitamin D deficiency, unspecified: Secondary | ICD-10-CM | POA: Diagnosis not present

## 2016-01-27 LAB — IRON AND TIBC
Iron: 11 ug/dL — ABNORMAL LOW (ref 28–170)
Saturation Ratios: 2 % — ABNORMAL LOW (ref 10.4–31.8)
TIBC: 550 ug/dL — AB (ref 250–450)
UIBC: 540 ug/dL

## 2016-01-27 LAB — COMPREHENSIVE METABOLIC PANEL
ALK PHOS: 85 U/L (ref 38–126)
ALT: 12 U/L — AB (ref 14–54)
AST: 22 U/L (ref 15–41)
Albumin: 4.5 g/dL (ref 3.5–5.0)
Anion gap: 7 (ref 5–15)
BUN: 14 mg/dL (ref 6–20)
CALCIUM: 8.8 mg/dL — AB (ref 8.9–10.3)
CHLORIDE: 111 mmol/L (ref 101–111)
CO2: 20 mmol/L — AB (ref 22–32)
CREATININE: 0.6 mg/dL (ref 0.44–1.00)
GFR calc non Af Amer: >60 mL/min (ref >60)
Glucose, Bld: 93 mg/dL (ref 65–99)
Potassium: 3.7 mmol/L (ref 3.5–5.1)
SODIUM: 138 mmol/L (ref 135–145)
Total Bilirubin: 0.3 mg/dL (ref 0.3–1.2)
Total Protein: 7.7 g/dL (ref 6.5–8.1)

## 2016-01-27 LAB — CBC WITH DIFFERENTIAL/PLATELET
BASOS ABS: 0 10*3/uL (ref 0–0.1)
Basophils Relative: 1 %
Eosinophils Absolute: 0.2 10*3/uL (ref 0–0.7)
Eosinophils Relative: 4 %
HCT: 30.6 % — ABNORMAL LOW (ref 35.0–47.0)
HEMOGLOBIN: 9.8 g/dL — AB (ref 12.0–16.0)
LYMPHS ABS: 1.9 10*3/uL (ref 1.0–3.6)
LYMPHS PCT: 38 %
MCH: 22.9 pg — AB (ref 26.0–34.0)
MCHC: 31.9 g/dL — ABNORMAL LOW (ref 32.0–36.0)
MCV: 71.8 fL — AB (ref 80.0–100.0)
Monocytes Absolute: 0.3 10*3/uL (ref 0.2–0.9)
Monocytes Relative: 6 %
NEUTROS PCT: 51 %
Neutro Abs: 2.5 10*3/uL (ref 1.4–6.5)
PLATELETS: 244 10*3/uL (ref 150–440)
RBC: 4.26 MIL/uL (ref 3.80–5.20)
RDW: 16.3 % — ABNORMAL HIGH (ref 11.5–14.5)
WBC: 4.8 10*3/uL (ref 3.6–11.0)

## 2016-01-27 LAB — FERRITIN: FERRITIN: 4 ng/mL — AB (ref 11–307)

## 2016-01-27 LAB — VITAMIN B12: VITAMIN B 12: 68 pg/mL — AB (ref 180–914)

## 2016-01-29 ENCOUNTER — Inpatient Hospital Stay (HOSPITAL_BASED_OUTPATIENT_CLINIC_OR_DEPARTMENT_OTHER): Payer: Managed Care, Other (non HMO) | Admitting: Hematology and Oncology

## 2016-01-29 ENCOUNTER — Other Ambulatory Visit: Payer: Self-pay | Admitting: Hematology and Oncology

## 2016-01-29 ENCOUNTER — Encounter: Payer: Self-pay | Admitting: Hematology and Oncology

## 2016-01-29 ENCOUNTER — Inpatient Hospital Stay: Payer: Managed Care, Other (non HMO)

## 2016-01-29 DIAGNOSIS — D508 Other iron deficiency anemias: Secondary | ICD-10-CM

## 2016-01-29 DIAGNOSIS — R252 Cramp and spasm: Secondary | ICD-10-CM

## 2016-01-29 DIAGNOSIS — N920 Excessive and frequent menstruation with regular cycle: Secondary | ICD-10-CM

## 2016-01-29 DIAGNOSIS — G63 Polyneuropathy in diseases classified elsewhere: Secondary | ICD-10-CM

## 2016-01-29 DIAGNOSIS — E559 Vitamin D deficiency, unspecified: Secondary | ICD-10-CM

## 2016-01-29 DIAGNOSIS — Z8669 Personal history of other diseases of the nervous system and sense organs: Secondary | ICD-10-CM

## 2016-01-29 DIAGNOSIS — D509 Iron deficiency anemia, unspecified: Secondary | ICD-10-CM | POA: Diagnosis not present

## 2016-01-29 DIAGNOSIS — R5383 Other fatigue: Secondary | ICD-10-CM

## 2016-01-29 DIAGNOSIS — E538 Deficiency of other specified B group vitamins: Secondary | ICD-10-CM

## 2016-01-29 DIAGNOSIS — Z9884 Bariatric surgery status: Secondary | ICD-10-CM

## 2016-01-29 DIAGNOSIS — M791 Myalgia: Secondary | ICD-10-CM

## 2016-01-29 DIAGNOSIS — Z79899 Other long term (current) drug therapy: Secondary | ICD-10-CM | POA: Diagnosis not present

## 2016-01-29 DIAGNOSIS — F419 Anxiety disorder, unspecified: Secondary | ICD-10-CM

## 2016-01-29 DIAGNOSIS — F329 Major depressive disorder, single episode, unspecified: Secondary | ICD-10-CM

## 2016-01-29 DIAGNOSIS — G629 Polyneuropathy, unspecified: Secondary | ICD-10-CM

## 2016-01-29 DIAGNOSIS — K909 Intestinal malabsorption, unspecified: Secondary | ICD-10-CM

## 2016-01-29 DIAGNOSIS — F5089 Other specified eating disorder: Secondary | ICD-10-CM

## 2016-01-29 MED ORDER — VITAMIN D (ERGOCALCIFEROL) 1.25 MG (50000 UNIT) PO CAPS: 50000.0000 [IU] | ORAL_CAPSULE | ORAL | 0 refills | Status: DC

## 2016-01-29 MED ORDER — CYANOCOBALAMIN 1000 MCG/ML IJ SOLN
1000.0000 ug | INTRAMUSCULAR | Status: DC
  Administered 2016-01-29: 1000 ug via INTRAMUSCULAR
  Filled 2016-01-29: qty 1

## 2016-01-29 MED ORDER — SODIUM CHLORIDE 0.9 % IV SOLN
Freq: Once | INTRAVENOUS | Status: AC
  Administered 2016-01-29: 16:00:00 via INTRAVENOUS
  Filled 2016-01-29: qty 1000

## 2016-01-29 MED ORDER — B-12 1000 MCG/ML IJ KIT
1.0000 mg | PACK | INTRAMUSCULAR | 9 refills | Status: AC
Start: 2016-01-29 — End: 2016-03-26

## 2016-01-29 MED ORDER — DIPHENHYDRAMINE HCL 50 MG/ML IJ SOLN
25.0000 mg | Freq: Once | INTRAMUSCULAR | Status: AC
  Administered 2016-01-29: 25 mg via INTRAVENOUS
  Filled 2016-01-29: qty 1

## 2016-01-29 MED ORDER — FERUMOXYTOL INJECTION 510 MG/17 ML
510.0000 mg | Freq: Once | INTRAVENOUS | Status: AC
  Administered 2016-01-29: 510 mg via INTRAVENOUS
  Filled 2016-01-29: qty 17

## 2016-01-29 NOTE — Assessment & Plan Note (Addendum)
This is mild but has started to affect her balance. Hopefully, it is still reversible with high-dose vitamin B12 replacement therapy. If her peripheral neuropathy does not improve, I would recommend checking serum copper level to exclude concurrent copper deficiency due to malabsorption

## 2016-01-29 NOTE — Assessment & Plan Note (Addendum)
Her recent serum vitamin B-12 level was low and she is not receiving adequate replacement therapy. I am very concerned as the patient has peripheral neuropathy, likely due to vitamin B-12 deficiency. I recommend urgent treatment with vitamin B-12 injection today, weekly 4 followed by once a month. The patient wants to get vitamin B-12 injections at home. Her husband would like to give the injections to her. I will get nursing staff to teach her B-12 administration at home. We will check her B-12 level in the next visit to ensure adequate replacement

## 2016-01-29 NOTE — Progress Notes (Signed)
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 female was transferred to my care after her prior physician is not available  I reviewed the patient's records extensive and collaborated the history with the patient. 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 She relocated to [facility] recently and has been receiving intravenous iron infusion along with vitamin B-12 supplementation. She has history of menorrhagia. Recently underwent D&C in August 2017. Surgical pathology showed no evidence of malignancy.  Since then, she denies further menorrhagia. Her recent blood work shows significant iron deficiency anemia along with vitamin B-12 deficiency. She returns today to receive intravenous iron infusion. She denies side effects from iron treatment She has not been given vitamin B-12 injections recently and complained of peripheral neuropathy. Denies memory issue. She complained of recent hair loss. She also have diffuse musculoskeletal pain and sensation of feeling cold all the time work. She has significant muscle cramping. She has significant pica with excessive chewing of eyes. She complained of fatigue. Denies any recent chest pain, shortness of breath but has some mild dizziness  MEDICAL HISTORY:  Past Medical History:  Diagnosis Date  . Anemia   . Anxiety   . Depression   . Migraine    In past    SURGICAL HISTORY: Past Surgical History:  Procedure Laterality Date  . CHOLECYSTECTOMY    . DILATION AND CURETTAGE OF UTERUS  2003   2 Miscarriages  . DILITATION &  CURRETTAGE/HYSTROSCOPY WITH NOVASURE ABLATION N/A 12/16/2015   Procedure: DILATATION & CURETTAGE/HYSTEROSCOPY WITH NOVASURE ABLATION;  Surgeon: Brayton Mars, MD;  Location: [facility] ORS;  Service: Gynecology;  Laterality: N/A;  . GALLBLADDER SURGERY  2003  . GASTRIC BYPASS  2004  . HERNIA REPAIR  2005    SOCIAL HISTORY: Social History   Social History  . Marital status: Divorced    Spouse name: N/A  . Number of children: N/A  . Years of education: N/A   Occupational History  . Not on file.   Social History Main Topics  . Smoking status: Never Smoker  . Smokeless tobacco: Never Used  . Alcohol use No  . Drug use: No  . Sexual activity: Yes     Comment: vasectomy   Other Topics Concern  . Not on file   Social History Narrative  . No narrative on file    FAMILY HISTORY: Family History  Problem Relation Age of Onset  . Adopted: Yes  . Family history unknown: Yes    ALLERGIES:  has No Known Allergies.  MEDICATIONS:  Current Outpatient Prescriptions  Medication Sig Dispense Refill  . Cyanocobalamin (B-12) 1000 MCG/ML KIT Inject 1 mg as directed once a week. Weekly x 4, then monthly x 12 1 kit 9  . Cyanocobalamin (VITAMIN B-12 IJ) Inject 1,000 mcg as directed every 30 (thirty) days.    . Magnesium 250 MG TABS Take 1 tablet (250 mg total) by mouth at bedtime. (Patient not taking: Reported on 01/29/2016) 30 tablet 11  . SUMAtriptan (IMITREX) 100 MG tablet Take 1 tablet (100 mg total) by mouth every 2 (two) hours as needed for migraine. May repeat in  2 hours if headache persists or recurs. (Patient not taking: Reported on 01/29/2016) 10 tablet 4  . Vitamin D, Ergocalciferol, (DRISDOL) 50000 units CAPS capsule Take 1 capsule (50,000 Units total) by mouth every 7 (seven) days. 12 capsule 0   No current facility-administered medications for this visit.    Facility-Administered Medications Ordered in Other Visits  Medication Dose Route Frequency Provider Last Rate Last  Dose  . cyanocobalamin ((VITAMIN B-12)) injection 1,000 mcg  1,000 mcg Intramuscular Q30 days Heath Lark, MD   1,000 mcg at 01/29/16 1517  . ferumoxytol (FERAHEME) 510 mg in sodium chloride 0.9 % 100 mL IVPB  510 mg Intravenous Once Heath Lark, MD   510 mg at 01/29/16 1528    REVIEW OF SYSTEMS:   Constitutional: Denies fevers, chills or abnormal night sweats Eyes: Denies blurriness of vision, double vision or watery eyes Ears, nose, mouth, throat, and face: Denies mucositis or sore throat Respiratory: Denies cough, dyspnea or wheezes Cardiovascular: Denies palpitation, chest discomfort or lower extremity swelling Gastrointestinal:  Denies nausea, heartburn or change in bowel habits Skin: Denies abnormal skin rashes Lymphatics: Denies new lymphadenopathy or easy bruising Behavioral/Psych: Mood is stable, no new changes  All other systems were reviewed with the patient and are negative.  PHYSICAL EXAMINATION: ECOG PERFORMANCE STATUS: 1 - Symptomatic but completely ambulatory  Vitals:   01/29/16 1417  BP: 126/84  Pulse: 63  Resp: 18  Temp: 97.2 F (36.2 C)   Filed Weights   01/29/16 1417  Weight: 181 lb 12.3 oz (82.5 kg)    GENERAL:alert, no distress and comfortable, Moderately obese SKIN: skin color is pale, texture, turgor are normal, no rashes or significant lesions. Noted mild alopecia EYES: normal, conjunctiva are pale and non-injected, sclera clear OROPHARYNX:no exudate, normal lips, buccal mucosa, and tongue  NECK: supple, thyroid normal size, non-tender, without nodularity LYMPH:  no palpable lymphadenopathy in the cervical, axillary or inguinal LUNGS: clear to auscultation and percussion with normal breathing effort HEART: regular rate & rhythm and no murmurs without lower extremity edema ABDOMEN:abdomen soft, non-tender and normal bowel sounds Musculoskeletal:no cyanosis of digits and no clubbing  PSYCH: alert & oriented x 3 with fluent speech NEURO: no focal  motor/sensory deficits  LABORATORY DATA:  I have reviewed the data as listed Lab Results  Component Value Date   WBC 4.8 01/27/2016   HGB 9.8 (L) 01/27/2016   HCT 30.6 (L) 01/27/2016   MCV 71.8 (L) 01/27/2016   PLT 244 01/27/2016    Recent Labs  07/10/15 1030 01/27/16 1457  NA 140 138  K 5.2 3.7  CL 105 111  CO2 21 20*  GLUCOSE 102* 93  BUN 12 14  CREATININE 0.61 0.60  CALCIUM 9.4 8.8*  GFRNONAA 108 >60  GFRAA 125 >60  PROT 6.9 7.7  ALBUMIN 4.3 4.5  AST 13 22  ALT 8 12*  ALKPHOS 93 85  BILITOT 0.4 0.3    ASSESSMENT & PLAN:  Iron deficiency anemia The most likely cause of her anemia is due to chronic blood loss/malabsorption syndrome. We discussed some of the risks, benefits, and alternatives of intravenous iron infusions. The patient is symptomatic from anemia and the iron level is critically low. She tolerated oral iron supplement poorly and desires to achieved higher levels of iron faster for adequate hematopoesis. Some of the side-effects to be expected including risks of infusion reactions, phlebitis, headaches, nausea and fatigue.  The patient is willing to proceed. Patient education material was dispensed.  Goal is  to keep ferritin level greater than 50 Due to severe iron deficiency anemia, I recommend 2 doses of IV iron this week and next week and recheck in 3 months. She had recent endometrial ablation/D&C for menorrhagia and hopefully that would reduce the need for intravenous iron requirement in the future   Vitamin D deficiency She has received high-dose vitamin D replacement therapy in the past. She is experiencing diffuse musculoskeletal pain that could be related to severe vitamin D deficiency I will proceed to give her repeat prescription for high-dose vitamin D replacement therapy and plan to recheck serum vitamin D level in the near future  S/P gastric bypass This is the most likely culprit for her multiple mineral deficiencies. She will need close  blood work monitoring and replacement therapy as needed  B12 deficiency Her recent serum vitamin B-12 level was low and she is not receiving adequate replacement therapy. I am very concerned as the patient has peripheral neuropathy, likely due to vitamin B-12 deficiency. I recommend urgent treatment with vitamin B-12 injection today, weekly 4 followed by once a month. The patient wants to get vitamin B-12 injections at home. Her husband would like to give the injections to her. I will get nursing staff to teach her B-12 administration at home. We will check her B-12 level in the next visit to ensure adequate replacement  Vitamin B12 deficiency neuropathy ([place]) This is mild but has started to affect her balance. Hopefully, it is still reversible with high-dose vitamin B12 replacement therapy. If her peripheral neuropathy does not improve, I would recommend checking serum copper level to exclude concurrent copper deficiency due to malabsorption   Orders Placed This Encounter  Procedures  . CBC & Diff and Retic    Standing Status:   Future    Standing Expiration Date:   03/04/2017  . Ferritin    Standing Status:   Future    Standing Expiration Date:   03/04/2017  . Vitamin B12    Standing Status:   Future    Standing Expiration Date:   03/04/2017  . Vitamin D 25 hydroxy    Standing Status:   Future    Standing Expiration Date:   03/04/2017  . CBC & Diff and Retic    Standing Status:   Future    Standing Expiration Date:   03/04/2017    All questions were answered. The patient knows to call the clinic with any problems, questions or concerns. I spent 25 minutes counseling the patient face to face. The total time spent in the appointment was 40 minutes and more than 50% was on counseling.     Heath Lark, MD 01/29/2016 3:36 PM

## 2016-01-29 NOTE — Assessment & Plan Note (Signed)
This is the most likely culprit for her multiple mineral deficiencies. She will need close blood work monitoring and replacement therapy as needed

## 2016-01-29 NOTE — Assessment & Plan Note (Addendum)
The most likely cause of her anemia is due to chronic blood loss/malabsorption syndrome. We discussed some of the risks, benefits, and alternatives of intravenous iron infusions. The patient is symptomatic from anemia and the iron level is critically low. She tolerated oral iron supplement poorly and desires to achieved higher levels of iron faster for adequate hematopoesis. Some of the side-effects to be expected including risks of infusion reactions, phlebitis, headaches, nausea and fatigue.  The patient is willing to proceed. Patient education material was dispensed.  Goal is to keep ferritin level greater than 50 Due to severe iron deficiency anemia, I recommend 2 doses of IV iron this week and next week and recheck in 3 months. She had recent endometrial ablation/D&C for menorrhagia and hopefully that would reduce the need for intravenous iron requirement in the future

## 2016-01-29 NOTE — Assessment & Plan Note (Addendum)
She has received high-dose vitamin D replacement therapy in the past. She is experiencing diffuse musculoskeletal pain that could be related to severe vitamin D deficiency I will proceed to give her repeat prescription for high-dose vitamin D replacement therapy and plan to recheck serum vitamin D level in the near future

## 2016-02-05 ENCOUNTER — Inpatient Hospital Stay: Payer: Managed Care, Other (non HMO)

## 2016-02-05 VITALS — BP 137/81 | HR 58 | Temp 95.8°F | Resp 20

## 2016-02-05 DIAGNOSIS — D508 Other iron deficiency anemias: Secondary | ICD-10-CM

## 2016-02-05 DIAGNOSIS — E538 Deficiency of other specified B group vitamins: Secondary | ICD-10-CM

## 2016-02-05 DIAGNOSIS — D509 Iron deficiency anemia, unspecified: Secondary | ICD-10-CM | POA: Diagnosis not present

## 2016-02-05 MED ORDER — DIPHENHYDRAMINE HCL 50 MG/ML IJ SOLN
25.0000 mg | Freq: Once | INTRAMUSCULAR | Status: AC
  Administered 2016-02-05: 25 mg via INTRAVENOUS
  Filled 2016-02-05: qty 1

## 2016-02-05 MED ORDER — CYANOCOBALAMIN 1000 MCG/ML IJ SOLN
1000.0000 ug | INTRAMUSCULAR | Status: DC
  Administered 2016-02-05: 1000 ug via INTRAMUSCULAR
  Filled 2016-02-05: qty 1

## 2016-02-05 MED ORDER — SODIUM CHLORIDE 0.9 % IV SOLN
Freq: Once | INTRAVENOUS | Status: AC
  Administered 2016-02-05: 14:00:00 via INTRAVENOUS
  Filled 2016-02-05: qty 1000

## 2016-02-05 MED ORDER — SODIUM CHLORIDE 0.9 % IV SOLN
510.0000 mg | Freq: Once | INTRAVENOUS | Status: AC
  Administered 2016-02-05: 510 mg via INTRAVENOUS
  Filled 2016-02-05: qty 17

## 2016-02-06 ENCOUNTER — Encounter: Payer: Self-pay | Admitting: Obstetrics and Gynecology

## 2016-02-11 ENCOUNTER — Encounter: Payer: Managed Care, Other (non HMO) | Admitting: Obstetrics and Gynecology

## 2016-02-21 ENCOUNTER — Telehealth: Payer: Self-pay | Admitting: *Deleted

## 2016-02-21 ENCOUNTER — Other Ambulatory Visit: Payer: Self-pay

## 2016-02-21 ENCOUNTER — Other Ambulatory Visit: Payer: Self-pay | Admitting: Hematology and Oncology

## 2016-02-21 ENCOUNTER — Inpatient Hospital Stay: Payer: Managed Care, Other (non HMO) | Attending: Internal Medicine

## 2016-02-21 DIAGNOSIS — E559 Vitamin D deficiency, unspecified: Secondary | ICD-10-CM

## 2016-02-21 DIAGNOSIS — D509 Iron deficiency anemia, unspecified: Secondary | ICD-10-CM | POA: Insufficient documentation

## 2016-02-21 DIAGNOSIS — Z9884 Bariatric surgery status: Secondary | ICD-10-CM

## 2016-02-21 DIAGNOSIS — D508 Other iron deficiency anemias: Secondary | ICD-10-CM

## 2016-02-21 DIAGNOSIS — E538 Deficiency of other specified B group vitamins: Secondary | ICD-10-CM

## 2016-02-21 DIAGNOSIS — D649 Anemia, unspecified: Secondary | ICD-10-CM

## 2016-02-21 DIAGNOSIS — E610 Copper deficiency: Secondary | ICD-10-CM

## 2016-02-21 LAB — SAMPLE TO BLOOD BANK

## 2016-02-21 LAB — CBC WITH DIFFERENTIAL/PLATELET
BASOS PCT: 1 %
Basophils Absolute: 0 10*3/uL (ref 0–0.1)
EOS ABS: 0.1 10*3/uL (ref 0–0.7)
EOS PCT: 2 %
HCT: 39.1 % (ref 35.0–47.0)
Hemoglobin: 13 g/dL (ref 12.0–16.0)
LYMPHS ABS: 1.4 10*3/uL (ref 1.0–3.6)
Lymphocytes Relative: 25 %
MCH: 26.9 pg (ref 26.0–34.0)
MCHC: 33.3 g/dL (ref 32.0–36.0)
MCV: 80.7 fL (ref 80.0–100.0)
MONO ABS: 0.3 10*3/uL (ref 0.2–0.9)
MONOS PCT: 5 %
Neutro Abs: 3.6 10*3/uL (ref 1.4–6.5)
Neutrophils Relative %: 67 %
Platelets: 172 10*3/uL (ref 150–440)
RBC: 4.84 MIL/uL (ref 3.80–5.20)
RDW: 27.8 % — AB (ref 11.5–14.5)
WBC: 5.4 10*3/uL (ref 3.6–11.0)

## 2016-02-21 LAB — VITAMIN B12: Vitamin B-12: 1384 pg/mL — ABNORMAL HIGH (ref 180–914)

## 2016-02-21 LAB — FERRITIN: Ferritin: 122 ng/mL (ref 11–307)

## 2016-02-21 NOTE — Telephone Encounter (Signed)
Per Dr. Alvy Bimler patient is not anemic based on labs from today. Patient had additional lab work drawn that will result next week, patient will get a call regarding those results. Left vm regarding labs from today.

## 2016-02-21 NOTE — Telephone Encounter (Signed)
Called to report that she is experiencing extreme weakness, numbness and tinging in her feet and was told to call if she had any of these symptoms. I discussed with Dr Alvy Bimler and she has ordered labs for today to be drawn. Patient agrees to come in this afternoon after she picks her daughter up from school

## 2016-02-22 LAB — VITAMIN D 25 HYDROXY (VIT D DEFICIENCY, FRACTURES): VIT D 25 HYDROXY: 25 ng/mL — AB (ref 30.0–100.0)

## 2016-02-23 LAB — COPPER, SERUM: COPPER: 106 ug/dL (ref 72–166)

## 2016-02-26 ENCOUNTER — Telehealth: Payer: Self-pay

## 2016-02-26 NOTE — Telephone Encounter (Signed)
Made several attempts to contact patient.  Left message and contact information for questions or concerns regarding lab results and prescription if needed.

## 2016-02-27 ENCOUNTER — Telehealth: Payer: Self-pay | Admitting: *Deleted

## 2016-02-27 NOTE — Telephone Encounter (Signed)
Called patient to inquire if she received a voicemail yesterday from Dr. Calton Dach staff.  States she did not.  Gave patient the following information:  Patient not anemic, iron level good, Vitamin B-12 and copper levels good.  Vitamin D is low.  Patient states she was given prescription for 50,000 units once a week X 12 weeks.  Advised patient to keep appointment scheduled in January.  Patient verbalized understanding.

## 2016-03-10 ENCOUNTER — Ambulatory Visit: Payer: Self-pay | Admitting: Physician Assistant

## 2016-03-10 ENCOUNTER — Encounter: Payer: Self-pay | Admitting: Physician Assistant

## 2016-03-10 VITALS — BP 120/88 | HR 57 | Temp 98.6°F

## 2016-03-10 DIAGNOSIS — M542 Cervicalgia: Secondary | ICD-10-CM

## 2016-03-10 MED ORDER — BACLOFEN 10 MG PO TABS: 10.0000 mg | ORAL_TABLET | Freq: Three times a day (TID) | ORAL | 0 refills | Status: DC

## 2016-03-10 MED ORDER — METHYLPREDNISOLONE 4 MG PO TBPK: ORAL_TABLET | ORAL | 0 refills | Status: DC

## 2016-03-10 NOTE — Progress Notes (Signed)
S: c/o neck pain for several days, used multiple otc meds but area is still spasmed and can't turn her neck well, no fever/chills, no known injury, no numbness or tingling  O: vitals wnl, nad, cspine nontender, + multiple muscle spasms throughout her back, extremely tender along trapezious, decreased rom of neck, n/v intact  A: acute neck pain with spasms  P: medrol dose pack, baclofen, wet heat, stretches followed by ice

## 2016-03-31 ENCOUNTER — Ambulatory Visit: Payer: Managed Care, Other (non HMO) | Admitting: Obstetrics and Gynecology

## 2016-04-22 ENCOUNTER — Inpatient Hospital Stay: Payer: Managed Care, Other (non HMO) | Attending: Internal Medicine

## 2016-04-22 ENCOUNTER — Other Ambulatory Visit: Payer: Self-pay

## 2016-04-22 DIAGNOSIS — D500 Iron deficiency anemia secondary to blood loss (chronic): Secondary | ICD-10-CM | POA: Diagnosis not present

## 2016-04-22 DIAGNOSIS — D649 Anemia, unspecified: Secondary | ICD-10-CM

## 2016-04-22 LAB — CBC WITH DIFFERENTIAL/PLATELET
BASOS ABS: 0 10*3/uL (ref 0–0.1)
Basophils Relative: 1 %
EOS ABS: 0.2 10*3/uL (ref 0–0.7)
EOS PCT: 4 %
HCT: 40.7 % (ref 35.0–47.0)
Hemoglobin: 13.8 g/dL (ref 12.0–16.0)
LYMPHS PCT: 39 %
Lymphs Abs: 1.8 10*3/uL (ref 1.0–3.6)
MCH: 29.5 pg (ref 26.0–34.0)
MCHC: 33.9 g/dL (ref 32.0–36.0)
MCV: 87.2 fL (ref 80.0–100.0)
MONO ABS: 0.3 10*3/uL (ref 0.2–0.9)
Monocytes Relative: 8 %
Neutro Abs: 2.2 10*3/uL (ref 1.4–6.5)
Neutrophils Relative %: 48 %
PLATELETS: 173 10*3/uL (ref 150–440)
RBC: 4.67 MIL/uL (ref 3.80–5.20)
RDW: 14.6 % — AB (ref 11.5–14.5)
WBC: 4.6 10*3/uL (ref 3.6–11.0)

## 2016-04-22 LAB — IRON AND TIBC
Iron: 49 ug/dL (ref 28–170)
Saturation Ratios: 12 % (ref 10.4–31.8)
TIBC: 398 ug/dL (ref 250–450)
UIBC: 349 ug/dL

## 2016-04-22 LAB — FERRITIN: Ferritin: 32 ng/mL (ref 11–307)

## 2016-04-23 ENCOUNTER — Ambulatory Visit: Payer: Managed Care, Other (non HMO) | Admitting: Obstetrics and Gynecology

## 2016-04-29 ENCOUNTER — Inpatient Hospital Stay (HOSPITAL_BASED_OUTPATIENT_CLINIC_OR_DEPARTMENT_OTHER): Payer: Managed Care, Other (non HMO) | Admitting: Internal Medicine

## 2016-04-29 ENCOUNTER — Inpatient Hospital Stay: Payer: Managed Care, Other (non HMO)

## 2016-04-29 VITALS — BP 138/88 | HR 59 | Temp 96.3°F | Wt 189.4 lb

## 2016-04-29 DIAGNOSIS — E538 Deficiency of other specified B group vitamins: Secondary | ICD-10-CM

## 2016-04-29 DIAGNOSIS — R5383 Other fatigue: Secondary | ICD-10-CM

## 2016-04-29 DIAGNOSIS — D500 Iron deficiency anemia secondary to blood loss (chronic): Secondary | ICD-10-CM | POA: Diagnosis not present

## 2016-04-29 DIAGNOSIS — D508 Other iron deficiency anemias: Secondary | ICD-10-CM

## 2016-04-29 DIAGNOSIS — Z8669 Personal history of other diseases of the nervous system and sense organs: Secondary | ICD-10-CM

## 2016-04-29 DIAGNOSIS — F329 Major depressive disorder, single episode, unspecified: Secondary | ICD-10-CM

## 2016-04-29 DIAGNOSIS — Z9884 Bariatric surgery status: Secondary | ICD-10-CM

## 2016-04-29 DIAGNOSIS — Z9049 Acquired absence of other specified parts of digestive tract: Secondary | ICD-10-CM

## 2016-04-29 DIAGNOSIS — K909 Intestinal malabsorption, unspecified: Secondary | ICD-10-CM

## 2016-04-29 DIAGNOSIS — D509 Iron deficiency anemia, unspecified: Secondary | ICD-10-CM | POA: Diagnosis not present

## 2016-04-29 DIAGNOSIS — Z79899 Other long term (current) drug therapy: Secondary | ICD-10-CM

## 2016-04-29 DIAGNOSIS — F419 Anxiety disorder, unspecified: Secondary | ICD-10-CM

## 2016-04-29 MED ORDER — DIPHENHYDRAMINE HCL 50 MG/ML IJ SOLN
INTRAMUSCULAR | Status: AC
  Filled 2016-04-29: qty 1

## 2016-04-29 MED ORDER — DIPHENHYDRAMINE HCL 50 MG/ML IJ SOLN
25.0000 mg | Freq: Once | INTRAMUSCULAR | Status: AC
  Administered 2016-04-29: 25 mg via INTRAVENOUS

## 2016-04-29 MED ORDER — CYANOCOBALAMIN 1000 MCG/ML IJ SOLN: 1000.0000 ug | INTRAMUSCULAR | 11 refills | Status: DC

## 2016-04-29 MED ORDER — SODIUM CHLORIDE 0.9 % IV SOLN
510.0000 mg | Freq: Once | INTRAVENOUS | Status: AC
  Administered 2016-04-29: 510 mg via INTRAVENOUS
  Filled 2016-04-29: qty 17

## 2016-04-29 MED ORDER — SODIUM CHLORIDE 0.9 % IV SOLN
Freq: Once | INTRAVENOUS | Status: AC
  Administered 2016-04-29: 16:00:00 via INTRAVENOUS
  Filled 2016-04-29: qty 1000

## 2016-04-29 NOTE — Progress Notes (Signed)
[facility] NOTE  Patient Care Team: Amy Overton Mam, NP as PCP - General (Family Medicine)  CHIEF COMPLAINTS/PURPOSE OF CONSULTATION:   #  Iron Def sec to gastric bypass-   # B12 def on IM B12 injections  # GASTRIC BY PASS [2004]   HISTORY OF PRESENTING ILLNESS:  Crystal Levine 51 y.o.  female  With above history of gastric bypass- and consequent malabsorption of iron B12 is here for follow-up  Patient feels tired in the last few weeks. Last received IV iron approximately 3 months ago. Patient is getting B12 injections at home/pulse weekly.   ROS: A complete 10 point review of system is done which is negative except mentioned above in history of present illness  MEDICAL HISTORY:  Past Medical History:  Diagnosis Date  . Anemia   . Anxiety   . Depression   . Migraine    In past    SURGICAL HISTORY: Past Surgical History:  Procedure Laterality Date  . CHOLECYSTECTOMY    . DILATION AND CURETTAGE OF UTERUS  2003   2 Miscarriages  . DILITATION & CURRETTAGE/HYSTROSCOPY WITH NOVASURE ABLATION N/A 12/16/2015   Procedure: DILATATION & CURETTAGE/HYSTEROSCOPY WITH NOVASURE ABLATION;  Surgeon: Brayton Mars, MD;  Location: [facility] ORS;  Service: Gynecology;  Laterality: N/A;  . GALLBLADDER SURGERY  2003  . GASTRIC BYPASS  2004  . HERNIA REPAIR  2005    SOCIAL HISTORY: Social History   Social History  . Marital status: Divorced    Spouse name: N/A  . Number of children: N/A  . Years of education: N/A   Occupational History  . Not on file.   Social History Main Topics  . Smoking status: Never Smoker  . Smokeless tobacco: Never Used  . Alcohol use No  . Drug use: No  . Sexual activity: Yes     Comment: vasectomy   Other Topics Concern  . Not on file   Social History Narrative  . No narrative on file    FAMILY HISTORY: Family History  Problem Relation Age of Onset  . Adopted: Yes  . Family history unknown: Yes    ALLERGIES:   has No Known Allergies.  MEDICATIONS:  Current Outpatient Prescriptions  Medication Sig Dispense Refill  . baclofen (LIORESAL) 10 MG tablet Take 1 tablet (10 mg total) by mouth 3 (three) times daily. 30 each 0  . Magnesium 250 MG TABS Take 1 tablet (250 mg total) by mouth at bedtime. 30 tablet 11  . SUMAtriptan (IMITREX) 100 MG tablet Take 1 tablet (100 mg total) by mouth every 2 (two) hours as needed for migraine. May repeat in 2 hours if headache persists or recurs. 10 tablet 4  . Vitamin D, Ergocalciferol, (DRISDOL) 50000 units CAPS capsule Take 1 capsule (50,000 Units total) by mouth every 7 (seven) days. 12 capsule 0  . cyanocobalamin (,VITAMIN B-12,) 1000 MCG/ML injection Inject 1 mL (1,000 mcg total) into the muscle every 30 (thirty) days. 1 mL 11   No current facility-administered medications for this visit.       Marland Kitchen  PHYSICAL EXAMINATION:   Vitals:   04/29/16 1411  BP: 138/88  Pulse: (!) 59  Temp: (!) 96.3 F (35.7 C)   Filed Weights   04/29/16 1411  Weight: 189 lb 6 oz (85.9 kg)    GENERAL: Well-nourished well-developed; Alert, no distress and comfortable.  Accompanied by her husband. EYES: no pallor or icterus OROPHARYNX: no thrush or ulceration; good dentition  NECK: supple, no masses felt LYMPH:  no palpable lymphadenopathy in the cervical, axillary or inguinal regions LUNGS: clear to auscultation and  No wheeze or crackles HEART/CVS: regular rate & rhythm and no murmurs; No lower extremity edema ABDOMEN: abdomen soft, non-tender and normal bowel sounds Musculoskeletal:no cyanosis of digits and no clubbing  PSYCH: alert & oriented x 3 with fluent speech NEURO: no focal motor/sensory deficits SKIN:  no rashes or significant lesions  LABORATORY DATA:  I have reviewed the data as listed Lab Results  Component Value Date   WBC 4.6 04/22/2016   HGB 13.8 04/22/2016   HCT 40.7 04/22/2016   MCV 87.2 04/22/2016   PLT 173 04/22/2016    Recent Labs   07/10/15 1030 01/27/16 1457  NA 140 138  K 5.2 3.7  CL 105 111  CO2 21 20*  GLUCOSE 102* 93  BUN 12 14  CREATININE 0.61 0.60  CALCIUM 9.4 8.8*  GFRNONAA 108 >60  GFRAA 125 >60  PROT 6.9 7.7  ALBUMIN 4.3 4.5  AST 13 22  ALT 8 12*  ALKPHOS 93 85  BILITOT 0.4 0.3      ASSESSMENT & PLAN:   Iron malabsorption # Iron deficiency-  secondary to gastric bypass/malabsorption. Today hemoglobin is 13 however ferritin is low.  Recommend Feraheme every 3 months with Benadryl premedication.    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 Will give script today for B12 injections.  #  Patient follow-up with me in 6 months with IV Feraheme/ labs  Few days prior; Feraheme labs in 3 months .     Cammie Sickle, MD 04/30/2016 5:06 PM

## 2016-04-29 NOTE — Assessment & Plan Note (Addendum)
#   Iron deficiency-  secondary to gastric bypass/malabsorption. Today hemoglobin is 13 however ferritin is low.  Recommend Feraheme every 3 months with Benadryl premedication.    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 Will give script today for B12 injections.  #  Patient follow-up with me in 6 months with IV Feraheme/ labs  Few days prior; Feraheme labs in 3 months 

## 2016-04-29 NOTE — Progress Notes (Signed)
Patient here today for follow up.   

## 2016-05-21 ENCOUNTER — Encounter: Payer: Self-pay | Admitting: Obstetrics and Gynecology

## 2016-05-21 ENCOUNTER — Ambulatory Visit (INDEPENDENT_AMBULATORY_CARE_PROVIDER_SITE_OTHER): Payer: Managed Care, Other (non HMO) | Admitting: Obstetrics and Gynecology

## 2016-05-21 VITALS — BP 142/82 | HR 60 | Ht 63.0 in | Wt 188.4 lb

## 2016-05-21 DIAGNOSIS — Z9889 Other specified postprocedural states: Secondary | ICD-10-CM

## 2016-05-21 DIAGNOSIS — N921 Excessive and frequent menstruation with irregular cycle: Secondary | ICD-10-CM

## 2016-05-21 NOTE — Progress Notes (Signed)
Chief complaint: 1. History of abnormal uterine bleeding 2. Status post endometrial ablation  Patient presents for follow-up approximately 4 months after endometrial ablation. She is doing well without any abnormal uterine bleeding over the past 2 months. The first month following the ablation she had some spotting almost daily. She is not experiencing menstrual cramps or vaginal discharge. She denies pelvic pain.  Past medical history, past surgical history, problem list, medications, and allergies are reviewed  OBJECTIVE: BP (!) 142/82   Pulse 60   Ht 5\' 3"  (1.6 m)   Wt 188 lb 6.4 oz (85.5 kg)   BMI 33.37 kg/m  Physical exam-deferred  ASSESSMENT: 1. Amenorrhea 2 months status post NovaSure endometrial ablation  PLAN: 1. Continue menstrual calendar monitoring 2. Return in 6 weeks for annual exam  A total of 15 minutes were spent face-to-face with the patient during this encounter and over half of that time dealt with counseling and coordination of care.  Brayton Mars, MD  Note: This dictation was prepared with Dragon dictation along with smaller phrase technology. Any transcriptional errors that result from this process are unintentional.

## 2016-05-21 NOTE — Patient Instructions (Signed)
1. Continue menstrual calendar monitoring 2. Return in 6 weeks for annual exam

## 2016-06-02 ENCOUNTER — Ambulatory Visit (INDEPENDENT_AMBULATORY_CARE_PROVIDER_SITE_OTHER): Payer: Managed Care, Other (non HMO) | Admitting: Obstetrics and Gynecology

## 2016-06-02 ENCOUNTER — Encounter: Payer: Self-pay | Admitting: Obstetrics and Gynecology

## 2016-06-02 VITALS — BP 119/77 | HR 67 | Ht 63.0 in | Wt 186.8 lb

## 2016-06-02 DIAGNOSIS — N63 Unspecified lump in unspecified breast: Secondary | ICD-10-CM | POA: Diagnosis not present

## 2016-06-02 DIAGNOSIS — N6019 Diffuse cystic mastopathy of unspecified breast: Secondary | ICD-10-CM | POA: Diagnosis not present

## 2016-06-02 DIAGNOSIS — N644 Mastodynia: Secondary | ICD-10-CM | POA: Diagnosis not present

## 2016-06-02 NOTE — Progress Notes (Signed)
Chief complaint: 1. Left breast lump  Patient presents for evaluation of possible left breast nodule that was noted one month ago. The lump is without significant pain, or skin changes. There is no nipple discharge. Patient is status post endometrial ablation and without cycles. Previous mammogram was uncertain-possibly [id number], and normal Past medical history: Negative for prior breast biopsy or surgery Past family history: Unknown due to adopted status  Past medical history, past surgical history, problem list, medications, and allergies are reviewed  OBJECTIVE: BP 119/77   Pulse 67   Ht 5\' 3"  (1.6 m)   Wt 186 lb 12.8 oz (84.7 kg)   BMI 33.09 kg/m  Pleasant female in no acute distress HEENT exam normocephalic atraumatic Neck supple without thyromegaly or adenopathy Breasts bilaterally symmetric without dominant masses adenopathy or nipple discharge; no significant skin changes; diffuse fibrocystic changes are palpated throughout both breasts Lymph nodes survey-without axillary or inframammary nodes; without palpable supraclavicular nodes  ASSESSMENT: 1. Left breast nodule-palpated in the upper inner quadrant, currently not palpable on exam today 2. Fibrocystic changes 3. Unknown family history for breast disease due to patient being adopted  PLAN: 1. Diagnostic mammogram with ultrasound as needed 2. Follow-up as needed  A total of 15 minutes were spent face-to-face with the patient during this encounter and over half of that time dealt with counseling and coordination of care.  Brayton Mars, MD  Note: This dictation was prepared with Dragon dictation along with smaller phrase technology. Any transcriptional errors that result from this process are unintentional.

## 2016-06-02 NOTE — Addendum Note (Signed)
Addended by: Elouise Munroe on: 06/02/2016 03:28 PM   Modules accepted: Orders

## 2016-06-02 NOTE — Patient Instructions (Signed)
1. Breast exam is notable for diffuse fibrocystic changes-nothing suspicious 2. Diagnostic mammogram of left breast and ultrasound is ordered to assess previously palpated lump

## 2016-06-04 ENCOUNTER — Ambulatory Visit: Payer: Self-pay | Admitting: Physician Assistant

## 2016-06-05 ENCOUNTER — Encounter: Payer: Self-pay | Admitting: Family Medicine

## 2016-06-05 ENCOUNTER — Ambulatory Visit (INDEPENDENT_AMBULATORY_CARE_PROVIDER_SITE_OTHER): Payer: Managed Care, Other (non HMO) | Admitting: Family Medicine

## 2016-06-05 VITALS — BP 135/80 | HR 67 | Temp 98.3°F | Resp 16 | Ht 63.0 in | Wt 186.0 lb

## 2016-06-05 DIAGNOSIS — J209 Acute bronchitis, unspecified: Secondary | ICD-10-CM

## 2016-06-05 DIAGNOSIS — R6889 Other general symptoms and signs: Secondary | ICD-10-CM

## 2016-06-05 LAB — POCT INFLUENZA A/B
Influenza A, POC: NEGATIVE
Influenza B, POC: NEGATIVE

## 2016-06-05 MED ORDER — BENZONATATE 100 MG PO CAPS: 100.0000 mg | ORAL_CAPSULE | Freq: Three times a day (TID) | ORAL | 0 refills | Status: DC | PRN

## 2016-06-05 MED ORDER — IPRATROPIUM BROMIDE 0.06 % NA SOLN: 2.0000 | Freq: Four times a day (QID) | NASAL | 0 refills | Status: DC

## 2016-06-05 NOTE — Patient Instructions (Addendum)
Thank you for coming in to clinic today.  Your flu test was NEGATIVE, this is not 100% though, and you can still have the flu with a negative test, otherwise it could be a different viral syndrome.  1. It sounds like you had an Upper Respiratory Virus or Allergies that cause sinus drainage that has settled into a Bronchitis, lower respiratory tract infection. I don't have concerns for pneumonia today, and think that this should gradually improve. Once you are feeling better, the cough may take a few weeks to fully resolve. - Start Atrovent nasal spray 2 sprays in each nostril up to max 3-4 times a day up to 3-4 days for nasal congestion, if you use for longer than this it may no longer be effective, stop using at 1 week. - Start Tessalon Perls (cough medicine) take 1 every 8 hours as needed for cough - Recommend OTC Mucinex-DM for 1 week or less, to help clear the mucus      - Take Ibuprofen / Advil 400-600mg  every 6-8 hours as needed for fever / muscle aches, and may also take Tylenol 500-1000mg  per dose every 6-8 hours or 3 times a day, can alternate dosing - Drink plenty of fluids to improve congestion - You may try over the counter Nasal Saline spray (Simply Saline, Ocean Spray) as needed to reduce congestion.  If your symptoms seem to worsen instead of improve over next several days, including significant fever / chills, worsening shortness of breath, worsening wheezing, or nausea / vomiting and can't take medicines - return sooner or go to hospital Emergency Department for more immediate treatment.  Please schedule a follow-up appointment with Dr. Parks Ranger in 1-2 weeks as needed if worsening from Flu / Bronchitis  If you have any other questions or concerns, please feel free to call the clinic or send a message through [facility]. You may also schedule an earlier appointment if necessary.  Crystal Putnam, DO [place]

## 2016-06-05 NOTE — Progress Notes (Signed)
Subjective:    Patient ID: Crystal Levine, female    DOB: 04-Feb-1966, 51 y.o.   MRN: [id number]  Crystal Levine is a 51 y.o. female presenting on 06/05/2016 for Cough   HPI  ACUTE BRONCHITIS / FLU-LIKE ILLNESS / COUGH Reports symptoms started about 2-3 days ago with persistent worsening cough, with some chest congestion with difficulty producing mucus, and some left sided ribcage soreness with coughing. - Taking OTC NyQuil / Theraflu minor relief only. No medicine or anti-pyretic this morning. - No known sick contacts. No recent illnesses or antibiotics. Did not get flu shot this season. Sick contact with daughter-inlaw 1 week ago (she had flu previously but had been better at that time) - Admits some generalized fatigue, generalized aches occasionally, nasal congestion, sore throat - Denies any fevers/chills, sweats, ear pain, nausea, vomiting, diarrhea, abdominal pain  Social History  Substance Use Topics  . Smoking status: Never Smoker  . Smokeless tobacco: Never Used  . Alcohol use No    Review of Systems Per HPI unless specifically indicated above      Objective:    BP 135/80 (BP Location: Left Arm, Patient Position: Sitting, Cuff Size: Normal)   Pulse 67   Temp 98.3 F (36.8 C) (Oral)   Resp 16   Ht 5\' 3"  (1.6 m)   Wt 186 lb (84.4 kg)   BMI 32.95 kg/m   Wt Readings from Last 3 Encounters:  06/05/16 186 lb (84.4 kg)  06/02/16 186 lb 12.8 oz (84.7 kg)  05/21/16 188 lb 6.4 oz (85.5 kg)    Physical Exam  Constitutional: She appears well-developed and well-nourished. No distress.  Well-appearing, comfortable, cooperative  HENT:  Head: Normocephalic and atraumatic.  Frontal / maxillary sinuses non-tender. Nares mostly patent with mild congestion without purulence. Bilateral TMs clear without erythema, effusion or bulging. Oropharynx generalized posterior pharyngeal erythema without focal exudates, edema or asymmetry.  Eyes: Conjunctivae are normal. Right eye exhibits  no discharge. Left eye exhibits no discharge.  Neck: Normal range of motion. Neck supple.  Cardiovascular: Normal rate, regular rhythm, normal heart sounds and intact distal pulses.   No murmur heard. Pulmonary/Chest: Effort normal and breath sounds normal. No respiratory distress. She has no wheezes. She has no rales.  Good air movement  Lymphadenopathy:    She has no cervical adenopathy.  Neurological: She is alert.  Skin: Skin is warm and dry. No rash noted. She is not diaphoretic. No erythema.  Psychiatric: Her behavior is normal.  Nursing note and vitals reviewed.  I have personally reviewed the following lab results from 04/22/16.  Results for orders placed or performed in visit on 06/05/16  POCT Influenza A/B  Result Value Ref Range   Influenza A, POC Negative Negative   Influenza B, POC Negative Negative      Assessment & Plan:   Problem List Items Addressed This Visit    None    Visit Diagnoses    Acute bronchitis, unspecified organism    -  Primary  Consistent with likely viral URI vs worsening bronchitis, with some +sick contacts, < 1 week duration - Afebrile, no focal signs of infection (not consistent with pneumonia by history or exam), no evidence sinusitis. No wheezing or coarse breath sounds.  Plan: 1. Reassurance, continue supportive measures - No antibiotics today, but may follow-up early next week if not improving 24-48 hours, start taking Azithromycin Z-pak x 5 days 2. Start Atrovent nasal spray decongestant 2 sprays in each nostril up  to 4 times daily for 7 days 3. Start Tessalon Perls PRN cough 4. Mucinex-DM OTC 1 week 5. Recommend trial OTC - Tylenol/Ibuprofen PRN, Nasal saline, lozenges, tea with honey/lemon 6. Return criteria reviewed, follow-up within 1-2 weeks if not improved      Relevant Medications   benzonatate (TESSALON) 100 MG capsule   ipratropium (ATROVENT) 0.06 % nasal spray   Flu-like symptoms     Not clinically consistent with  influenza, rapid flu test was negative. No empiric therapy. Treat as bronchitis.     Relevant Orders   POCT Influenza A/B (Completed)      Meds ordered this encounter  Medications  . benzonatate (TESSALON) 100 MG capsule    Sig: Take 1 capsule (100 mg total) by mouth 3 (three) times daily as needed for cough.    Dispense:  30 capsule    Refill:  0  . ipratropium (ATROVENT) 0.06 % nasal spray    Sig: Place 2 sprays into both nostrils 4 (four) times daily. For up to 5-7 days then stop.    Dispense:  15 mL    Refill:  0      Follow up plan: Return in about 2 weeks (around 06/19/2016), or if symptoms worsen or fail to improve, for bronchitis, flu-like.  [name], [facility] 06/05/2016, 11:24 AM

## 2016-06-08 ENCOUNTER — Telehealth: Payer: Self-pay | Admitting: Family Medicine

## 2016-06-08 DIAGNOSIS — J209 Acute bronchitis, unspecified: Secondary | ICD-10-CM

## 2016-06-08 MED ORDER — AZITHROMYCIN 250 MG PO TABS: ORAL_TABLET | ORAL | 0 refills | Status: DC

## 2016-06-08 NOTE — Telephone Encounter (Signed)
Last visit 06/05/16, see note for details. Patient not improving as advised, still persistent cough, discussed antibiotic with azithromycin z-pak, sent to pharmacy.  [name], [facility] 06/08/2016, 12:25 PM

## 2016-06-08 NOTE — Telephone Encounter (Signed)
Pt was just in and was told if she wasn't any better Dr. Raliegh Ip would call her in an antibiotic.  She used [facility] in [place].  Her call back number is [phone]

## 2016-06-30 ENCOUNTER — Encounter: Payer: Self-pay | Admitting: Obstetrics and Gynecology

## 2016-06-30 ENCOUNTER — Ambulatory Visit (INDEPENDENT_AMBULATORY_CARE_PROVIDER_SITE_OTHER): Payer: Managed Care, Other (non HMO) | Admitting: Obstetrics and Gynecology

## 2016-06-30 VITALS — BP 120/74 | HR 78 | Ht 63.0 in | Wt 185.9 lb

## 2016-06-30 DIAGNOSIS — Z01419 Encounter for gynecological examination (general) (routine) without abnormal findings: Secondary | ICD-10-CM | POA: Diagnosis not present

## 2016-06-30 DIAGNOSIS — Z9889 Other specified postprocedural states: Secondary | ICD-10-CM

## 2016-06-30 DIAGNOSIS — Z1231 Encounter for screening mammogram for malignant neoplasm of breast: Secondary | ICD-10-CM | POA: Diagnosis not present

## 2016-06-30 DIAGNOSIS — Z1239 Encounter for other screening for malignant neoplasm of breast: Secondary | ICD-10-CM

## 2016-06-30 NOTE — Progress Notes (Signed)
ANNUAL PREVENTATIVE CARE GYN  ENCOUNTER NOTE  Subjective:       Crystal Levine is a 51 y.o. [phone] female here for a routine annual gynecologic exam.  Current complaints: 1.  Sui- wear a pad at times  Patient reports normal bowel function. Bladder function is notable for stress incontinence where she wears pads she has not been doing any interventions to condition. She has not had any new interval health issues over the past year. Patient denies vasomotor symptoms and denies vaginal dryness.   Gynecologic History No LMP recorded. Patient has had an ablation. Contraception: none Last Pap: 2016. Results were: normal Last mammogram: 2016. Results were: normal  Obstetric History OB History  Gravida Para Term Preterm AB Living  5 3 3   2 3   SAB TAB Ectopic Multiple Live Births  2       3    # Outcome Date GA Lbr Len/2nd Weight Sex Delivery Anes PTL Lv  5 Term 2006   6 lb 2.1 oz (2.781 kg) F Vag-Spont   LIV  4 SAB 2003          3 SAB 2003          2 Term 1999   8 lb 1.9 oz (3.683 kg) F Vag-Spont   LIV  1 Term 1993   8 lb 2.1 oz (3.688 kg) M Vag-Spont   LIV      Past Medical History:  Diagnosis Date  . Anemia   . Anxiety   . Depression   . Migraine    In past    Past Surgical History:  Procedure Laterality Date  . CHOLECYSTECTOMY    . DILATION AND CURETTAGE OF UTERUS  2003   2 Miscarriages  . DILITATION & CURRETTAGE/HYSTROSCOPY WITH NOVASURE ABLATION N/A 12/16/2015   Procedure: DILATATION & CURETTAGE/HYSTEROSCOPY WITH NOVASURE ABLATION;  Surgeon: Brayton Mars, MD;  Location: [facility] ORS;  Service: Gynecology;  Laterality: N/A;  . GALLBLADDER SURGERY  2003  . GASTRIC BYPASS  2004  . HERNIA REPAIR  2005    Current Outpatient Prescriptions on File Prior to Visit  Medication Sig Dispense Refill  . baclofen (LIORESAL) 10 MG tablet Take 1 tablet (10 mg total) by mouth 3 (three) times daily. 30 each 0  . cyanocobalamin (,VITAMIN B-12,) 1000 MCG/ML injection Inject 1  mL (1,000 mcg total) into the muscle every 30 (thirty) days. 1 mL 11  . ipratropium (ATROVENT) 0.06 % nasal spray Place 2 sprays into both nostrils 4 (four) times daily. For up to 5-7 days then stop. 15 mL 0  . Magnesium 250 MG TABS Take 1 tablet (250 mg total) by mouth at bedtime. 30 tablet 11  . SUMAtriptan (IMITREX) 100 MG tablet Take 1 tablet (100 mg total) by mouth every 2 (two) hours as needed for migraine. May repeat in 2 hours if headache persists or recurs. 10 tablet 4  . Vitamin D, Ergocalciferol, (DRISDOL) 50000 units CAPS capsule Take 1 capsule (50,000 Units total) by mouth every 7 (seven) days. 12 capsule 0   No current facility-administered medications on file prior to visit.     No Known Allergies  Social History   Social History  . Marital status: Divorced    Spouse name: N/A  . Number of children: N/A  . Years of education: N/A   Occupational History  . Not on file.   Social History Main Topics  . Smoking status: Never Smoker  . Smokeless tobacco: Never Used  .  Alcohol use No  . Drug use: No  . Sexual activity: Yes     Comment: vasectomy   Other Topics Concern  . Not on file   Social History Narrative  . No narrative on file    Family History  Problem Relation Age of Onset  . Adopted: Yes  . Family history unknown: Yes    The following portions of the patient's history were reviewed and updated as appropriate: allergies, current medications, past family history, past medical history, past social history, past surgical history and problem list.  Review of Systems ROS Review of Systems - General ROS: negative for - chills, fatigue, fever, hot flashes, night sweats, weight gain or weight loss Psychological ROS: negative for - anxiety, decreased libido, depression, mood swings, physical abuse or sexual abuse Ophthalmic ROS: negative for - blurry vision, eye pain or loss of vision ENT ROS: negative for - hearing change, visual changes or vocal changes.  POSITIVE-headaches Allergy and Immunology ROS: negative for - hives, itchy/watery eyes or seasonal allergies Hematological and Lymphatic ROS: negative for - bleeding problems, bruising, swollen lymph nodes or weight loss Endocrine ROS: negative for - galactorrhea, hair pattern changes, hot flashes, malaise/lethargy, mood swings, palpitations, polydipsia/polyuria, skin changes, temperature intolerance or unexpected weight changes Breast ROS: negative for - new or changing breast lumps or nipple discharge Respiratory ROS: negative for - cough or shortness of breath Cardiovascular ROS: negative for - chest pain, irregular heartbeat, palpitations or shortness of breath Gastrointestinal ROS: no abdominal pain, change in bowel habits, or black or bloody stools Genito-Urinary ROS: no dysuria, trouble voiding, or hematuria. Positive-S incontinence Musculoskeletal ROS: negative for - joint pain or joint stiffness Neurological ROS: negative for - bowel and bladder control changes Dermatological ROS: negative for rash and skin lesion changes    Objective:  BP 120/74   Pulse 78   Ht 5\' 3"  (1.6 m)   Wt 185 lb 14.4 oz (84.3 kg)   BMI 32.93 kg/m  CONSTITUTIONAL: Well- developed, well-nourished female in no acute distress.  PSYCHIATRIC: Normal mood and affect. Normal behavior. Normal judgment and thought content. [place]: Alert and oriented to person, place, and time. Normal muscle tone coordination. No cranial nerve deficit noted. HENT:  Normocephalic, atraumatic, External right and left ear normal. Oropharynx is clear and moist EYES: Conjunctivae and EOM are normal. Pupils are equal, round, and reactive to light. No scleral icterus.  NECK: Normal range of motion, supple, no masses.  Normal thyroid.  SKIN: Skin is warm and dry. No rash noted. Not diaphoretic. No erythema. No pallor. CARDIOVASCULAR: Normal heart rate noted, regular DX4/1 systolic murmur left sternal borderr. RESPIRATORY: Clear to  auscultation bilaterally. Effort and breath sounds normal, no problems with respiration noted. BREASTS: Symmetric in size. No masses, skin changes, nipple drainage, or lymphadenopathy. ABDOMEN: Soft, normal bowel sounds, no distention noted.  No tenderness, rebound or guarding.  BLADDER: Normal PELVIC:  External Genitalia: Normal  BUS: Normal  Vaginamildly atrophic; no lesions  Cerrvix: Normal; parous, no cervical motion tenderness  Uterus: Normal; midplane, mobile, nontender, normal size and shape  Adnexa: Normal; nonpalpable and nontender  RV: External Exam NormaI, No Rectal Masses and Normal Sphincter tone  MUSCULOSKELETAL: Normal range of motion. No tenderness.  No cyanosis, clubbing, or edema.  2+ distal pulses. LYMPHATIC: No Axillary, Supraclavicular, or Inguinal Adenopathy.    Assessment:   Annual gynecologic examination 51 y.o. Contraception: none [id number] Problem List Items Addressed This Visit    History of endometrial ablation  Other Visit Diagnoses    Well woman exam with routine gynecological exam    -  Primary   Screening for breast cancer        Amenorrhea, status post endometrial ablation Climacteric, asymptomatic   Plan:  Pap: Pap Co Test Mammogram: Ordered Stool Guaiac Testing:  Not Indicated Labs: thur pcp Routine preventative health maintenance measures emphasized: Exercise/Diet/Weight control, Tobacco Warnings and Alcohol/Substance use risks Return to [place], CMA  Brayton Mars, MD  Note: This dictation was prepared with Dragon dictation along with smaller phrase technology. Any transcriptional errors that result from this process are unintentional.

## 2016-06-30 NOTE — Patient Instructions (Addendum)
1. Pap smear is completed 2. Mammogram is ordered 3. Screening labs are obtained through primary care 4. Continue with healthy eating and exercise with controlled weight loss 5. Recommend calcium and vitamin D supplementation daily 6. Return in 1 year for annual exam  Health Maintenance, Female Adopting a healthy lifestyle and getting preventive care can go a long way to promote health and wellness. Talk with your health care provider about what schedule of regular examinations is right for you. This is a good chance for you to check in with your provider about disease prevention and staying healthy. In between checkups, there are plenty of things you can do on your own. Experts have done a lot of research about which lifestyle changes and preventive measures are most likely to keep you healthy. Ask your health care provider for more information. Weight and diet Eat a healthy diet  Be sure to include plenty of vegetables, fruits, low-fat dairy products, and lean protein.  Do not eat a lot of foods high in solid fats, added sugars, or salt.  Get regular exercise. This is one of the most important things you can do for your health.  Most adults should exercise for at least 150 minutes each week. The exercise should increase your heart rate and make you sweat (moderate-intensity exercise).  Most adults should also do strengthening exercises at least twice a week. This is in addition to the moderate-intensity exercise. Maintain a healthy weight  Body mass index (BMI) is a measurement that can be used to identify possible weight problems. It estimates body fat based on height and weight. Your health care provider can help determine your BMI and help you achieve or maintain a healthy weight.  For females 4 years of age and older:  A BMI below 18.5 is considered underweight.  A BMI of 18.5 to 24.9 is normal.  A BMI of 25 to 29.9 is considered overweight.  A BMI of 30 and above is  considered obese. Watch levels of cholesterol and blood lipids  You should start having your blood tested for lipids and cholesterol at 49 years of age, then have this test every 5 years.  You may need to have your cholesterol levels checked more often if:  Your lipid or cholesterol levels are high.  You are older than 49 years of age.  You are at high risk for heart disease. Cancer screening Lung Cancer  Lung cancer screening is recommended for adults 42-50 years old who are at high risk for lung cancer because of a history of smoking.  A yearly low-dose CT scan of the lungs is recommended for people who:  Currently smoke.  Have quit within the past 15 years.  Have at least a 30-pack-year history of smoking. A pack year is smoking an average of one pack of cigarettes a day for 1 year.  Yearly screening should continue until it has been 15 years since you quit.  Yearly screening should stop if you develop a health problem that would prevent you from having lung cancer treatment. Breast Cancer  Practice breast self-awareness. This means understanding how your breasts normally appear and feel.  It also means doing regular breast self-exams. Let your health care provider know about any changes, no matter how small.  If you are in your 20s or 30s, you should have a clinical breast exam (CBE) by a health care provider every 1-3 years as part of a regular health exam.  If you are 40 or  or older, have a CBE every year. Also consider having a breast X-ray (mammogram) every year.  If you have a family history of breast cancer, talk to your health care provider about genetic screening.  If you are at high risk for breast cancer, talk to your health care provider about having an MRI and a mammogram every year.  Breast cancer gene (BRCA) assessment is recommended for women who have family members with BRCA-related cancers. BRCA-related cancers  include:  Breast.  Ovarian.  Tubal.  Peritoneal cancers.  Results of the assessment will determine the need for genetic counseling and BRCA1 and BRCA2 testing. Cervical Cancer  Your health care provider may recommend that you be screened regularly for cancer of the pelvic organs (ovaries, uterus, and vagina). This screening involves a pelvic examination, including checking for microscopic changes to the surface of your cervix (Pap test). You may be encouraged to have this screening done every 3 years, beginning at age 21.  For women ages 30-65, health care providers may recommend pelvic exams and Pap testing every 3 years, or they may recommend the Pap and pelvic exam, combined with testing for human papilloma virus (HPV), every 5 years. Some types of HPV increase your risk of cervical cancer. Testing for HPV may also be done on women of any age with unclear Pap test results.  Other health care providers may not recommend any screening for nonpregnant women who are considered low risk for pelvic cancer and who do not have symptoms. Ask your health care provider if a screening pelvic exam is right for you.  If you have had past treatment for cervical cancer or a condition that could lead to cancer, you need Pap tests and screening for cancer for at least 20 years after your treatment. If Pap tests have been discontinued, your risk factors (such as having a new sexual partner) need to be reassessed to determine if screening should resume. Some women have medical problems that increase the chance of getting cervical cancer. In these cases, your health care provider may recommend more frequent screening and Pap tests. Colorectal Cancer  This type of cancer can be detected and often prevented.  Routine colorectal cancer screening usually begins at 50 years of age and continues through 49 years of age.  Your health care provider may recommend screening at an earlier age if you have risk factors  for colon cancer.  Your health care provider may also recommend using home test kits to check for hidden blood in the stool.  A small camera at the end of a tube can be used to examine your colon directly (sigmoidoscopy or colonoscopy). This is done to check for the earliest forms of colorectal cancer.  Routine screening usually begins at age 50.  Direct examination of the colon should be repeated every 5-10 years through 49 years of age. However, you may need to be screened more often if early forms of precancerous polyps or small growths are found. Skin Cancer  Check your skin from head to toe regularly.  Tell your health care provider about any new moles or changes in moles, especially if there is a change in a mole's shape or color.  Also tell your health care provider if you have a mole that is larger than the size of a pencil eraser.  Always use sunscreen. Apply sunscreen liberally and repeatedly throughout the day.  Protect yourself by wearing long sleeves, pants, a wide-brimmed hat, and sunglasses whenever you are outside. Heart   disease, diabetes, and high blood pressure  High blood pressure causes heart disease and increases the risk of stroke. High blood pressure is more likely to develop in:  People who have blood pressure in the high end of the normal range (130-139/85-89 mm Hg).  People who are overweight or obese.  People who are African American.  If you are 18-39 years of age, have your blood pressure checked every 3-5 years. If you are 40 years of age or older, have your blood pressure checked every year. You should have your blood pressure measured twice-once when you are at a hospital or clinic, and once when you are not at a hospital or clinic. Record the average of the two measurements. To check your blood pressure when you are not at a hospital or clinic, you can use:  An automated blood pressure machine at a pharmacy.  A home blood pressure monitor.  If you  are between 55 years and 79 years old, ask your health care provider if you should take aspirin to prevent strokes.  Have regular diabetes screenings. This involves taking a blood sample to check your fasting blood sugar level.  If you are at a normal weight and have a low risk for diabetes, have this test once every three years after 49 years of age.  If you are overweight and have a high risk for diabetes, consider being tested at a younger age or more often. Preventing infection Hepatitis B  If you have a higher risk for hepatitis B, you should be screened for this virus. You are considered at high risk for hepatitis B if:  You were born in a country where hepatitis B is common. Ask your health care provider which countries are considered high risk.  Your parents were born in a high-risk country, and you have not been immunized against hepatitis B (hepatitis B vaccine).  You have HIV or AIDS.  You use needles to inject street drugs.  You live with someone who has hepatitis B.  You have had sex with someone who has hepatitis B.  You get hemodialysis treatment.  You take certain medicines for conditions, including cancer, organ transplantation, and autoimmune conditions. Hepatitis C  Blood testing is recommended for:  Everyone born from 1945 through 1965.  Anyone with known risk factors for hepatitis C. Sexually transmitted infections (STIs)  You should be screened for sexually transmitted infections (STIs) including gonorrhea and chlamydia if:  You are sexually active and are younger than 49 years of age.  You are older than 49 years of age and your health care provider tells you that you are at risk for this type of infection.  Your sexual activity has changed since you were last screened and you are at an increased risk for chlamydia or gonorrhea. Ask your health care provider if you are at risk.  If you do not have HIV, but are at risk, it may be recommended that you  take a prescription medicine daily to prevent HIV infection. This is called pre-exposure prophylaxis (PrEP). You are considered at risk if:  You are sexually active and do not regularly use condoms or know the HIV status of your partner(s).  You take drugs by injection.  You are sexually active with a partner who has HIV. Talk with your health care provider about whether you are at high risk of being infected with HIV. If you choose to begin PrEP, you should first be tested for HIV. You should then be   every 3 months for as long as you are taking PrEP. Pregnancy  If you are premenopausal and you may become pregnant, ask your health care provider about preconception counseling.  If you may become pregnant, take 400 to 800 micrograms (mcg) of folic acid every day.  If you want to prevent pregnancy, talk to your health care provider about birth control (contraception). Osteoporosis and menopause  Osteoporosis is a disease in which the bones lose minerals and strength with aging. This can result in serious bone fractures. Your risk for osteoporosis can be identified using a bone density scan.  If you are 64 years of age or older, or if you are at risk for osteoporosis and fractures, ask your health care provider if you should be screened.  Ask your health care provider whether you should take a calcium or vitamin D supplement to lower your risk for osteoporosis.  Menopause may have certain physical symptoms and risks.  Hormone replacement therapy may reduce some of these symptoms and risks. Talk to your health care provider about whether hormone replacement therapy is right for you. Follow these instructions at home:  Schedule regular health, dental, and eye exams.  Stay current with your immunizations.  Do not use any tobacco products including cigarettes, chewing tobacco, or electronic cigarettes.  If you are pregnant, do not drink alcohol.  If you are breastfeeding, limit  how much and how often you drink alcohol.  Limit alcohol intake to no more than 1 drink per day for nonpregnant women. One drink equals 12 ounces of beer, 5 ounces of wine, or 1 ounces of hard liquor.  Do not use street drugs.  Do not share needles.  Ask your health care provider for help if you need support or information about quitting drugs.  Tell your health care provider if you often feel depressed.  Tell your health care provider if you have ever been abused or do not feel safe at home. This information is not intended to replace advice given to you by your health care provider. Make sure you discuss any questions you have with your health care provider. Document Released: 10/20/2010 Document Revised: 09/12/2015 Document Reviewed: 01/08/2015 Elsevier Interactive Patient Education  2017 [employer].

## 2016-07-02 ENCOUNTER — Encounter: Payer: Self-pay | Admitting: Obstetrics and Gynecology

## 2016-07-09 LAB — PAP IG AND HPV HIGH-RISK
HPV, HIGH-RISK: POSITIVE — AB
PAP Smear Comment: 0

## 2016-07-10 ENCOUNTER — Encounter: Payer: Self-pay | Admitting: Emergency Medicine

## 2016-07-10 ENCOUNTER — Emergency Department
Admission: EM | Admit: 2016-07-10 | Discharge: 2016-07-10 | Disposition: A | Discharge status: Home / Self Care | Payer: Managed Care, Other (non HMO) | Attending: Emergency Medicine | Admitting: Emergency Medicine

## 2016-07-10 ENCOUNTER — Telehealth: Payer: Self-pay | Admitting: Family Medicine

## 2016-07-10 DIAGNOSIS — Z79899 Other long term (current) drug therapy: Secondary | ICD-10-CM | POA: Insufficient documentation

## 2016-07-10 DIAGNOSIS — I10 Essential (primary) hypertension: Secondary | ICD-10-CM | POA: Diagnosis not present

## 2016-07-10 DIAGNOSIS — G43909 Migraine, unspecified, not intractable, without status migrainosus: Secondary | ICD-10-CM

## 2016-07-10 DIAGNOSIS — R51 Headache: Secondary | ICD-10-CM | POA: Diagnosis present

## 2016-07-10 MED ORDER — DIPHENHYDRAMINE HCL 50 MG/ML IJ SOLN
25.0000 mg | Freq: Once | INTRAMUSCULAR | Status: AC
  Administered 2016-07-10: 25 mg via INTRAVENOUS

## 2016-07-10 MED ORDER — DIPHENHYDRAMINE HCL 50 MG/ML IJ SOLN
INTRAMUSCULAR | Status: AC
  Administered 2016-07-10: 25 mg via INTRAVENOUS
  Filled 2016-07-10: qty 1

## 2016-07-10 MED ORDER — BUTALBITAL-APAP-CAFFEINE 50-325-40 MG PO TABS: 1.0000 | ORAL_TABLET | Freq: Four times a day (QID) | ORAL | 0 refills | Status: AC | PRN

## 2016-07-10 MED ORDER — SODIUM CHLORIDE 0.9 % IV SOLN
1000.0000 mL | Freq: Once | INTRAVENOUS | Status: AC
  Administered 2016-07-10: 1000 mL via INTRAVENOUS

## 2016-07-10 MED ORDER — KETOROLAC TROMETHAMINE 30 MG/ML IJ SOLN
INTRAMUSCULAR | Status: AC
  Administered 2016-07-10: 30 mg via INTRAVENOUS
  Filled 2016-07-10: qty 1

## 2016-07-10 MED ORDER — METOCLOPRAMIDE HCL 5 MG/ML IJ SOLN
20.0000 mg | Freq: Once | INTRAVENOUS | Status: AC
  Administered 2016-07-10: 20 mg via INTRAVENOUS
  Filled 2016-07-10: qty 4

## 2016-07-10 MED ORDER — KETOROLAC TROMETHAMINE 30 MG/ML IJ SOLN
30.0000 mg | Freq: Once | INTRAMUSCULAR | Status: AC
  Administered 2016-07-10: 30 mg via INTRAVENOUS

## 2016-07-10 NOTE — ED Provider Notes (Signed)
[facility] Emergency Department Provider Note   ____________________________________________    I have reviewed the triage vital signs and the nursing notes.   HISTORY  Chief Complaint Headache and Hypertension     HPI Crystal Levine is a 51 y.o. female who presents with a migraine headache. Patient reports a history of migraines and today's presentation is typical of her headaches. She reports she developed a headache at approximately 2 AM but felt she needed to go to work but became steadily worse throughout the day. She checked her blood pressure and found to be elevated and became concerned so came to the emergency department. No neuro deficits. No nausea or vomiting.   Past Medical History:  Diagnosis Date  . Anemia   . Anxiety   . Depression   . Migraine    In past    Patient Active Problem List   Diagnosis Date Noted  . 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 Copper deficiency 02/21/2016  . 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 History of endometrial ablation 12/31/2015  . B12 deficiency anemia 11/06/2015  . Insomnia 09/04/2015  . B12 deficiency 09/04/2015  . Subclinical hyperthyroidism 09/04/2015  . Vitamin D deficiency 09/04/2015  . Iron deficiency anemia 07/09/2015  . Anxiety and depression 07/09/2015  . S/P gastric bypass 07/09/2015    Past Surgical History:  Procedure Laterality Date  . CHOLECYSTECTOMY    . DILATION AND CURETTAGE OF UTERUS  2003   2 Miscarriages  . DILITATION & CURRETTAGE/HYSTROSCOPY WITH NOVASURE ABLATION N/A 12/16/2015   Procedure: DILATATION & CURETTAGE/HYSTEROSCOPY WITH NOVASURE ABLATION;  Surgeon: Brayton Mars, MD;  Location: [facility] ORS;  Service: Gynecology;  Laterality: N/A;  . GALLBLADDER SURGERY  2003  . GASTRIC BYPASS  2004  . HERNIA REPAIR  2005    Prior to Admission medications   Medication Sig Start Date End Date Taking? Authorizing Provider  baclofen (LIORESAL) 10 MG  tablet Take 1 tablet (10 mg total) by mouth 3 (three) times daily. 03/10/16   Versie Starks, PA-C  butalbital-acetaminophen-caffeine (FIORICET, ESGIC) [phone] MG tablet Take 1-2 tablets by mouth every 6 (six) hours as needed for headache. 07/10/16 07/10/17  Lavonia Drafts, MD  cyanocobalamin (,VITAMIN B-12,) 1000 MCG/ML injection Inject 1 mL (1,000 mcg total) into the muscle every 30 (thirty) days. 04/29/16   Cammie Sickle, MD  ipratropium (ATROVENT) 0.06 % nasal spray Place 2 sprays into both nostrils 4 (four) times daily. For up to 5-7 days then stop. 06/05/16   Olin Hauser, DO  Magnesium 250 MG TABS Take 1 tablet (250 mg total) by mouth at bedtime. 07/09/15   Amy Overton Mam, NP  SUMAtriptan (IMITREX) 100 MG tablet Take 1 tablet (100 mg total) by mouth every 2 (two) hours as needed for migraine. May repeat in 2 hours if headache persists or recurs. 10/03/15   Versie Starks, PA-C  Vitamin D, Ergocalciferol, (DRISDOL) 50000 units CAPS capsule Take 1 capsule (50,000 Units total) by mouth every 7 (seven) days. 01/29/16   Heath Lark, MD     Allergies Patient has no known allergies.  Family History  Problem Relation Age of Onset  . Adopted: Yes  . Family history unknown: Yes    Social History Social History  Substance Use Topics  . Smoking status: Never Smoker  . Smokeless tobacco: Never Used  . Alcohol use No    Review of Systems  Constitutional: No fever/chills Eyes: No visual changes.  ENT: No  Neck pain  Gastrointestinal:   No nausea, no vomiting.    Musculoskeletal: Negative for joint pain Skin: Negative for rash. Neurological: Negative for  weakness  10-point ROS otherwise negative.  ____________________________________________   PHYSICAL EXAM:  VITAL SIGNS: ED Triage Vitals  Enc Vitals Group     BP 07/10/16 1507 (!) 154/97     Pulse Rate 07/10/16 1507 91     Resp 07/10/16 1507 16     Temp 07/10/16 1507 97.9 F (36.6 C)     Temp Source 07/10/16  1507 Oral     SpO2 07/10/16 1507 100 %     Weight 07/10/16 1508 182 lb (82.6 kg)     Height 07/10/16 1508 5\' 3"  (1.6 m)     Head Circumference --      Peak Flow --      Pain Score 07/10/16 1518 6     Pain Loc --      Pain Edu? --      Excl. in [place]? --     Constitutional: Alert and oriented. No acute distress. Pleasant and interactive Eyes: Conjunctivae are normal.   Nose: No congestion/rhinnorhea. Mouth/Throat: Mucous membranes are moist.    Cardiovascular: Normal rate, regular rhythm.  Good peripheral circulation. Respiratory: Normal respiratory effort.  No retractions.  Genitourinary: deferred Musculoskeletal: No lower extremity tenderness nor edema.  Warm and well perfused Neurologic:  Normal speech and language. No gross focal neurologic deficits are appreciated. Cranial nerves II-12 are normal Skin:  Skin is warm, dry and intact. No rash noted. Psychiatric: Mood and affect are normal. Speech and behavior are normal.  ____________________________________________   LABS (all labs ordered are listed, but only abnormal results are displayed)  Labs Reviewed - No data to display ____________________________________________  EKG  None ____________________________________________  RADIOLOGY  None ____________________________________________   PROCEDURES  Procedure(s) performed: No    Critical Care performed: No ____________________________________________   INITIAL IMPRESSION / ASSESSMENT AND PLAN / ED COURSE  Pertinent labs & imaging results that were available during my care of the patient were reviewed by me and considered in my medical decision making (see chart for details).  Patient presents with symptoms consistent with her typical migraine. We will treat with IV fluids, Toradol, Benadryl, Reglan and reevaluate.   After treatment patient reports her symptoms had almost entirely abated. She feels quite well and was anxious to leave. I feel this is  reasonable she notes she can return if any change in her symptoms.    ____________________________________________   FINAL CLINICAL IMPRESSION(S) / ED DIAGNOSES  Final diagnoses:  Migraine without status migrainosus, not intractable, unspecified migraine type  Hypertension, unspecified type      NEW MEDICATIONS STARTED DURING THIS VISIT:  Discharge Medication List as of 07/10/2016  6:13 PM    START taking these medications   Details  butalbital-acetaminophen-caffeine (FIORICET, ESGIC) 50-325-40 MG tablet Take 1-2 tablets by mouth every 6 (six) hours as needed for headache., Starting Fri 07/10/2016, Until Sat 07/10/2017, Print         Note:  This document was prepared using Dragon voice recognition software and may include unintentional dictation errors.    Lavonia Drafts, MD 07/10/16 [id number]

## 2016-07-10 NOTE — Telephone Encounter (Signed)
Patient called our office approx 2:00 to 2:15pm today with question of elevated BP up to 180/120 and associated headache since 0200 this morning. I spoke with her regarding her symptoms, she does not have prior history of HTN, not on any anti-HTN medications, she does have prior history of migraine headaches on imitrex. She has not had any relief of symptoms over many hours now, no known possible triggers or other factors. She denies any focal neuro deficits, weakness, numbness, tingling, in arm, leg or face, no loss of vision, chest pain, pressure or dyspnea.  I advised her that there are limited options for her in outpatient setting for such an acutely elevated BP with symptoms, and she would need further diagnostics such as labs, and possibly imaging, and more expedited medications, possibly IV, recommend best option is to go directly to [facility] ED for further evaluation. Patient understood.  [name], [facility] 07/10/2016, 3:05 PM

## 2016-07-10 NOTE — ED Notes (Signed)
ED Provider at bedside. 

## 2016-07-10 NOTE — ED Triage Notes (Signed)
Pt states that she was at work today and started having a bad headache, her blood pressure was taken at work and was 170/120 and 125/118. Pt called her PCP and was told to come to the ED for evaluation. Pt states that she has never been diagnosed with high blood pressure. Pt states that she has also been feeling nauseated today.  Pt in NAD in triage.

## 2016-07-13 ENCOUNTER — Ambulatory Visit (INDEPENDENT_AMBULATORY_CARE_PROVIDER_SITE_OTHER): Payer: Managed Care, Other (non HMO) | Admitting: Family Medicine

## 2016-07-13 ENCOUNTER — Encounter: Payer: Self-pay | Admitting: Family Medicine

## 2016-07-13 VITALS — BP 140/90 | HR 61 | Temp 98.0°F | Resp 16 | Ht 63.0 in | Wt 183.0 lb

## 2016-07-13 DIAGNOSIS — G43109 Migraine with aura, not intractable, without status migrainosus: Secondary | ICD-10-CM | POA: Diagnosis not present

## 2016-07-13 DIAGNOSIS — R946 Abnormal results of thyroid function studies: Secondary | ICD-10-CM | POA: Diagnosis not present

## 2016-07-13 DIAGNOSIS — R7989 Other specified abnormal findings of blood chemistry: Secondary | ICD-10-CM | POA: Insufficient documentation

## 2016-07-13 DIAGNOSIS — I10 Essential (primary) hypertension: Secondary | ICD-10-CM | POA: Diagnosis not present

## 2016-07-13 MED ORDER — AMLODIPINE BESYLATE 10 MG PO TABS: 10.0000 mg | ORAL_TABLET | Freq: Every day | ORAL | 5 refills | Status: DC

## 2016-07-13 NOTE — Progress Notes (Signed)
Subjective:    Patient ID: Crystal Levine, female    DOB: [birth year], 51 y.o.   MRN: [id number]  Crystal Levine is a 51 y.o. female presenting on 07/13/2016 for Hospitalization Follow-up (blood pressure was high this week end it's ranges 173/108 )  Patient presents for a same day appointment.  HPI   ED FOLLOW-UP Migraine Headaches, Acute on Chronic: - Reports prior history of migraine headaches, mostly remote history with several years of rare infrequent migraines about 1x yearly. Previously she had not discussed her migraine headaches with PCP recently. - Interval history with ED visit 07/10/16 for acute worsening migraine headache, she had contacted our office earlier in day describing BP to 180/120 and persistent headache, she was advised to go to ED for further evaluation. In ED found to have elevated BP 150/90, and she was given IV fluids, Toradol, Benadryl and Reglan with dramatic resolution of headache. Also given Fioricet rx PRN on discharge - Today 3 days later presents for ED follow-up. Currently without any active headache, 0/10. - Describes recent 2-3 months of increasing frequency migraine headaches, localized to posterior head with some radiation to frontal temples, behind eyes, often going to bed with HA and waking up still with HA, worse than previous history of migraines. Seems to have increasing frequency / severity, does have days without headache, usually >2 headaches a week. Prior to ED visit had persistent HA prior, then improvement after, developed mild more nagging headache in return. She had 2 pills Sumatriptan 100mg  left over from >1 year ago employee health visit, took one over weekend only mild relief, did not repeat dose 2 hours, took next following day on 3/25 some relief. Tried Excedrin migraine mild relief. Awaiting refill on Sumatriptan. - Admits associated nausea (without vomiting), palpitations, some lightheadedness without dizziness - Denies numbness, tingling,  weakness, chest pain, dyspnea, vision changes, pre-syncope or syncope  HTN, New diagnosis Reports prior history of normal BP, but has had some elevated BPs recently over past few months. Home readings 150-160s/90-100s, recently elevated. No prior dx HTN. Only gestational HTN with pregnancy x3 - No prior treatment anti-HTN - Husband has history of migraines and improved on anti-HTN therapy  LOW TSH Prior history of abnormal thyroid lab with 06/2015 and 10/2015, borderline low TSH, normal Free T4 and other thyroid studies, never on any treatment. No clear diagnosis in past.  PMH S/p gastric bypass 2004, with secondary iron deficiency and B12 deficiency - followed by [facility] for management, B12 injections, IV iron.  Social History  Substance Use Topics  . Smoking status: Never Smoker  . Smokeless tobacco: Never Used  . Alcohol use No    Review of Systems Per HPI unless specifically indicated above     Objective:    BP 140/90 (BP Location: Left Arm)   Pulse 61   Temp 98 F (36.7 C) (Oral)   Resp 16   Ht 5\' 3"  (1.6 m)   Wt 183 lb (83 kg)   BMI 32.42 kg/m   Wt Readings from Last 3 Encounters:  07/13/16 183 lb (83 kg)  07/10/16 182 lb (82.6 kg)  06/30/16 185 lb 14.4 oz (84.3 kg)    Physical Exam  Constitutional: She is oriented to person, place, and time. She appears well-developed and well-nourished. No distress.  Well-appearing, comfortable, cooperative  HENT:  Head: Normocephalic and atraumatic.  Mouth/Throat: Oropharynx is clear and moist.  Frontal / maxillary sinuses non-tender. Nares patent. Bilateral TMs clear without erythema, effusion  or bulging. Oropharynx clear without erythema, exudates, edema or asymmetry.  Eyes: Conjunctivae and EOM are normal. Pupils are equal, round, and reactive to light. Right eye exhibits no discharge. Left eye exhibits no discharge.  Neck: Normal range of motion. Neck supple. No thyromegaly present.  Neck Inspection:  Normal Palpation: non tender ROM: full active Strength: upper ext normal str 5/5  Cardiovascular: Normal rate, regular rhythm, normal heart sounds and intact distal pulses.   No murmur heard. Pulmonary/Chest: Effort normal and breath sounds normal. No respiratory distress. She has no wheezes. She has no rales.  Musculoskeletal: She exhibits no edema.  Upper / Lower Extremities: - Normal muscle tone, strength bilateral upper extremities 5/5, lower extremities 5/5  - Normal Gait  Lymphadenopathy:    She has no cervical adenopathy.  Neurological: She is alert and oriented to person, place, and time. No cranial nerve deficit. Coordination normal.  Distal sensation to light touch intact  Skin: Skin is warm and dry. No rash noted. She is not diaphoretic. No erythema.  Psychiatric: She has a normal mood and affect. Her behavior is normal.  Nursing note and vitals reviewed.      Assessment & Plan:   Problem List Items Addressed This Visit    Migraine with aura and without status migrainosus, not intractable - Primary    Consistent with increased frequency/severity migraine headaches over 2-3 months, recent severe migraine sent her to ED, question if secondary to elevated BP new dx HTN - Currently without active HA, well-appearing, no focal neuro deficits, tolerating PO w/o n/v - Inadequately treated for migraine HA at home  Plan: 1. Continue with current rx Sumatriptan 100mg  - reviewed instructions take one tab for severe HA, may repeat dose within 2 hr if persistent, no more in 24 hours. Counseling on potential side effect / intolerance with chest discomfort acutely after taking sumatriptan - advised consider nasal vs Onzetra in future if improving 2. Also recommend taking Ibuprofen 600-800mg  q 8 hr PRN, and can try Tylenol 1000mg  TID alternatively 3. Avoid triggers including foods, caffeine. Important to rest 4. Start treating her HTN Amlodipine - suspect may improve migraines 5.  Discussion on future migraine prophylaxis medications - handout given, review options at next visit, determine need if using sumatriptan frequently 6. Start headache diary, handout given, identify triggers for avoidance, bring to next visit 7. Return criteria given for acute migraine, when to go to office vs ED      Relevant Medications   amLODipine (NORVASC) 10 MG tablet   Low TSH level    Prior borderline low TSH, concern for possible hyperthyroidism, not subclinical if causing HTN vs headache, however other thyroid functions normal, including Free T4, seems unlikely - Re-check TSH, Free T4, since last >6 months ago      Relevant Orders   TSH   T4, free   Essential hypertension    New diagnosis today with HTN, given concern over few months elevated home readings, multiple readings over past 1 week with >140/90, previously significant concern with BP home readings >160/100, concern for etiology of her headaches secondary to HTN vs trigger worsening migraine headaches. Regarding outside readings, question accuracy, re-check multiple times with cuff today to calibrate, seems variable, suspect manual readings close to 140/90 are more accurate - No known complications  Plan: 1. Start Amlodipine 10mg  daily CCB may help migraines - discussed potential side effects 2. Keep monitor BP regularly - if significant low BP and symptomatic, can cut pill in half  for 5 mg daily 3. Future chemistry is already ordered per Hematology, will defer lab check to this, offered sooner BMET today, but will hold for now given improved headache, no labs checked in ED 4. Reviewed lifestyle factors - diet, low salt, exercise, limit stress, caffeine 5. Add TSH, Free T4 lab to re-check given prior low TSH but normal Free T4 question potential hyperthyroidism for HTN / headaches 6. Follow-up 4 weeks HTN      Relevant Medications   amLODipine (NORVASC) 10 MG tablet      Meds ordered this encounter  Medications  .  amLODipine (NORVASC) 10 MG tablet    Sig: Take 1 tablet (10 mg total) by mouth daily.    Dispense:  30 tablet    Refill:  5      Follow up plan: Return in about 4 weeks (around 08/10/2016) for migraines, HTN.  [name], [facility] 07/13/2016, 10:13 PM

## 2016-07-13 NOTE — Assessment & Plan Note (Signed)
New diagnosis today with HTN, given concern over few months elevated home readings, multiple readings over past 1 week with >140/90, previously significant concern with BP home readings >160/100, concern for etiology of her headaches secondary to HTN vs trigger worsening migraine headaches. Regarding outside readings, question accuracy, re-check multiple times with cuff today to calibrate, seems variable, suspect manual readings close to 140/90 are more accurate - No known complications  Plan: 1. Start Amlodipine 10mg  daily CCB may help migraines - discussed potential side effects 2. Keep monitor BP regularly - if significant low BP and symptomatic, can cut pill in half for 5 mg daily 3. Future chemistry is already ordered per Hematology, will defer lab check to this, offered sooner BMET today, but will hold for now given improved headache, no labs checked in ED 4. Reviewed lifestyle factors - diet, low salt, exercise, limit stress, caffeine 5. Add TSH, Free T4 lab to re-check given prior low TSH but normal Free T4 question potential hyperthyroidism for HTN / headaches 6. Follow-up 4 weeks HTN

## 2016-07-13 NOTE — Assessment & Plan Note (Signed)
Prior borderline low TSH, concern for possible hyperthyroidism, not subclinical if causing HTN vs headache, however other thyroid functions normal, including Free T4, seems unlikely - Re-check TSH, Free T4, since last >6 months ago

## 2016-07-13 NOTE — Assessment & Plan Note (Signed)
Consistent with increased frequency/severity migraine headaches over 2-3 months, recent severe migraine sent her to ED, question if secondary to elevated BP new dx HTN - Currently without active HA, well-appearing, no focal neuro deficits, tolerating PO w/o n/v - Inadequately treated for migraine HA at home  Plan: 1. Continue with current rx Sumatriptan 100mg  - reviewed instructions take one tab for severe HA, may repeat dose within 2 hr if persistent, no more in 24 hours. Counseling on potential side effect / intolerance with chest discomfort acutely after taking sumatriptan - advised consider nasal vs Onzetra in future if improving 2. Also recommend taking Ibuprofen 600-800mg  q 8 hr PRN, and can try Tylenol 1000mg  TID alternatively 3. Avoid triggers including foods, caffeine. Important to rest 4. Start treating her HTN Amlodipine - suspect may improve migraines 5. Discussion on future migraine prophylaxis medications - handout given, review options at next visit, determine need if using sumatriptan frequently 6. Start headache diary, handout given, identify triggers for avoidance, bring to next visit 7. Return criteria given for acute migraine, when to go to office vs ED

## 2016-07-13 NOTE — Patient Instructions (Signed)
Thank you for coming in to clinic today.  1. You most likely have Chronic Migraine Headaches - Migraine headaches present differently for many patients, pain is usually throbbing or aching, often on one side of head or behind the eye. They tend to last for up to hours or days. In treating migraines, our goal is to 1) stop the headache and 2) prevent recurrence of headaches  Treatment to STOP the headache at this time: - Start with Sumatriptan 100mg  - take 1 immediately at onset of moderate to severe migraine headache, if unresolved or return within 2 hours then repeat dose 1 tablet, that is max dose for 24 hours. - Try this for 1-2 weeks, if absolutely NOT helping then contact me to discuss changes, possibly can double sumatriptan dose or try nasal spray - You can still take over the counter meds with Ibuprofen up to 600-800mg  per dose 3 times a day with food for a few days and may try Excedrin Migraine (OR Fiorcet) as needed, ONLY use these after you have tried Sumatriptan up to 2 doses, and try to limit their use if they are not effective  ------------------ Treatment to PREVENT headaches: - Goal is to avoid triggers. We need to learn more details on what are your exact or possible headache triggers first. - Keep detailed headache diary (on printed handout) for possible triggers, bring this to your next visit to discuss further - Known possible triggers include caffeine, chocolate, alcohol, stress, weather changes, menstrual cycle, certain other foods - Also be aware that OTC pain meds/anti-inflammatories can cause rebound headache, they help resolve the headache but then after the effect wears off they can CAUSE a headache. Try to taper down and stop these medications and allow them to get out of your system for 1-2 weeks   Look into some of these alternatives for Migraine Headache Prophylaxis or preventative treatment  Antidepressant Type - Venlafaxine, Amitriptyline Blood pressure meds -  Metoprolol, Propanolol, Verapamil Anti Headache/Seizure/Nerve medications - Topamax, Gabapentin   You will be due for NON FASTING BLOOD WORK - within next 6 weeks for Thyroid open at 0800 am  - Make sure Lab Only appointment is at least 1-2 weeks before your next appointment, so that results will be available  For Lab Results, once available within 2-3 days of blood draw, you can can log in to MyChart online to view your results and a brief explanation. Also, we can discuss results at next follow-up visit.   Please schedule a follow-up appointment with Dr. Parks Ranger in 4 weeks for Migraine follow-up  If you have any other questions or concerns, please feel free to call the clinic or send a message through [place]. You may also schedule an earlier appointment if necessary.  [name], DO [place]

## 2016-07-16 ENCOUNTER — Ambulatory Visit (INDEPENDENT_AMBULATORY_CARE_PROVIDER_SITE_OTHER): Payer: Managed Care, Other (non HMO) | Admitting: Obstetrics and Gynecology

## 2016-07-16 ENCOUNTER — Encounter: Payer: Self-pay | Admitting: Obstetrics and Gynecology

## 2016-07-16 VITALS — BP 154/88 | HR 64 | Ht 63.0 in | Wt 182.0 lb

## 2016-07-16 DIAGNOSIS — R87612 Low grade squamous intraepithelial lesion on cytologic smear of cervix (LGSIL): Secondary | ICD-10-CM

## 2016-07-16 DIAGNOSIS — R87810 Cervical high risk human papillomavirus (HPV) DNA test positive: Secondary | ICD-10-CM

## 2016-07-16 NOTE — Patient Instructions (Addendum)
1. Colposcopy with biopsies was performed today 2. Results will be made available for my chart 3. Return in 6 months for Pap smear  Cervical Dysplasia Cervical dysplasia is a condition in which a woman's cervix cells have abnormal changes. The cervix is the opening of the uterus (womb). It is located between the vagina and the uterus. Cervical dysplasia may be an early sign of cervical cancer. If left untreated, this condition may become more severe and may progress to cervical cancer. Early detection, treatment, and follow-up care are very important. What are the causes? Cervical dysplasia can be caused by a human papillomavirus (HPV) infection. HPV is the most common sexually transmitted infection (STI). HPV is spread from person to person through sexual contact. This includes oral, vaginal, or anal sex. What increases the risk? The following factors may make you more likely to develop this condition:  Having had a sexually transmitted infection (STI), such as herpes, chlamydia or HPV.  Becoming sexually active before age 83.  Having had more than one sexual partner.  Having a sexual partner who has multiple sexual partners.  Not using protection, such as a condom, during sex, especially with new sexual partners.  Having a history of cancer of the vagina or vulva.  Having a weakened body defense (immune) system.  Being the daughter of a woman who took diethylstilbestrol (DES) during pregnancy.  Having a family history of cervical cancer.  Smoking.  Using oral contraceptives, also called birth control pills.  Having had three or more full-term pregnancies. What are the signs or symptoms? There are usually no symptoms of this condition. If you do have symptoms, they may include:  Abnormal vaginal discharge.  Bleeding between periods or after sex.  Bleeding during menopause.  Pain during sex (dyspareunia). How is this diagnosed? A test called a Pap test may be done.  During this test, cells are taken from the cervix and then examined under a microscope. A test in which tissue is removed from the cervix (biopsy) may also be done if the Pap test is abnormal or if the cervix looks abnormal. How is this treated? Treatment varies based on the severity of the condition. Treatment may include:  Cryotherapy. During cryotherapy, the abnormal cells are frozen with a steel-tip instrument.  Loop electrosurgical excision procedure (LEEP). This procedure removes abnormal tissue from the cervix.  Surgery to remove abnormal tissue. This is usually done in more advanced cases. Surgical options include:  A cone biopsy. This is a procedure in which the cervical canal and a portion of the center of the cervix are removed.  Hysterectomy. This is a surgery in which the uterus and cervix are removed. Follow these instructions at home:  Take over-the-counter and prescription medicines only as told by your health care provider.  Do not use tampons, have sex, or douche until your health care provider says it is okay.  Keep follow-up visits as told by your health care provider. This is important. Women who have been treated for cervical dysplasia should have regular pelvic exams and Pap tests as told by their health care provider. How is this prevented?  Practice safe sex to help prevent sexually transmitted infections (STI) that may cause this condition.  Have regular Pap tests. Talk with your health care provider about how often you need these tests. Pap tests will help identify cell changes that can lead to cancer. Contact a health care provider if:  You develop genital warts. Get help right away if:  You have a fever.  You have abnormal vaginal discharge.  Your menstrual period is heavier than normal.  You develop bright red bleeding. This may include blood clots.  You have pain or cramps that get worse, and medicine does not help to relieve your pain.  You feel  light-headed and you are unusually weak.  You have fainting spells.  You have pain in the abdomen. Summary  Cervical dysplasia is a condition in which a woman's cervix cells have abnormal changes.  If left untreated, this condition may become more severe and may progress to cervical cancer.  Early detection, treatment, and follow-up care are very important in managing this condition.  Have regular pelvic exams and Pap tests. Talk with your health care provider about how often you need these tests. Pap tests will help identify cell changes that can lead to cancer. This information is not intended to replace advice given to you by your health care provider. Make sure you discuss any questions you have with your health care provider. Document Released: 04/06/2005 Document Revised: 04/09/2016 Document Reviewed: 04/09/2016 Elsevier Interactive Patient Education  2017 [employer] After This sheet gives you information about how to care for yourself after your procedure. Your health care provider may also give you more specific instructions. If you have problems or questions, contact your health care provider. What can I expect after the procedure? If you had a colposcopy without a biopsy, you can expect to feel fine right away, but you may have some spotting for a few days. You can go back to your normal activities. If you had a colposcopy with a biopsy, it is common to have:  Soreness and pain. This may last for a few days.  Light-headedness.  Mild vaginal bleeding or dark-colored, grainy discharge. This may last for a few days. The discharge may be due to a solution that was used during the procedure. You may need to wear a sanitary pad during this time.  Spotting for at least 48 hours after the procedure. Follow these instructions at home:  Take over-the-counter and prescription medicines only as told by your health care provider. Talk with your health care provider  about what type of over-the-counter pain medicine and prescription medicine you can start taking again. It is especially important to talk with your health care provider if you take blood-thinning medicine.  Do not drive or use heavy machinery while taking prescription pain medicine.  For at least 3 days after your procedure, or as long as told by your health care provider, avoid:  Douching.  Using tampons.  Having sexual intercourse.  Continue to use birth control (contraception).  Limit your physical activity for the first day after the procedure as told by your health care provider. Ask your health care provider what activities are safe for you.  It is up to you to get the results of your procedure. Ask your health care provider, or the department performing the procedure, when your results will be ready.  Keep all follow-up visits as told by your health care provider. This is important. Contact a health care provider if:  You develop a skin rash. Get help right away if:  You are bleeding heavily from your vagina or you are passing blood clots. This includes using more than one sanitary pad per hour for 2 hours in a row.  You have a fever or chills.  You have pelvic pain.  You have abnormal, yellow-colored, or bad-smelling vaginal discharge.  This could be a sign of infection.  You have severe pain or cramps in your lower abdomen that do not get better with medicine.  You feel light-headed or dizzy, or you faint. Summary  If you had a colposcopy without a biopsy, you can expect to feel fine right away, but you may have some spotting for a few days. You can go back to your normal activities.  If you had a colposcopy with a biopsy, you may notice mild pain and spotting for 48 hours after the procedure.  Avoid douching, using tampons, and having sexual intercourse for 3 days after the procedure or as long as told by your health care provider.  Contact your health care  provider if you have bleeding, severe pain, or signs of infection. This information is not intended to replace advice given to you by your health care provider. Make sure you discuss any questions you have with your health care provider. Document Released: 01/25/2013 Document Revised: 11/22/2015 Document Reviewed: 11/22/2015 Elsevier Interactive Patient Education  2017 [employer].

## 2016-07-16 NOTE — Progress Notes (Signed)
Chief complaint: 1. 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 Colposcopy visit  Abnormal Pap smear history: 06/30/2016 Pap smear/HPV LGSIL/POSITIVE  Patient is a nonsmoker. Patient has most recent partner of 2 years duration.  Past Medical History:  Diagnosis Date  . Anemia   . Anxiety   . Depression   . Hypertension   . Migraine    In past   Past Surgical History:  Procedure Laterality Date  . CHOLECYSTECTOMY    . DILATION AND CURETTAGE OF UTERUS  2003   2 Miscarriages  . DILITATION & CURRETTAGE/HYSTROSCOPY WITH NOVASURE ABLATION N/A 12/16/2015   Procedure: DILATATION & CURETTAGE/HYSTEROSCOPY WITH NOVASURE ABLATION;  Surgeon: Brayton Mars, MD;  Location: [facility] ORS;  Service: Gynecology;  Laterality: N/A;  . GALLBLADDER SURGERY  2003  . GASTRIC BYPASS  2004  . HERNIA REPAIR  2005    OBJECTIVE: BP (!) 154/88   Pulse 64   Ht 5\' 3"  (1.6 m)   Wt 182 lb (82.6 kg)   LMP  (LMP Unknown)   BMI 32.24 kg/m  Pleasant female in no acute distress. Alert and oriented. Pelvic exam: External genitalia-normal BUS-normal Vagina-normal without lesions Cervix-parous; nabothian cysts at 4:00 and 3:00 Uterus-not examined Adnexa-not examined Rectovaginal-normal external exam  PROCEDURE: Colposcopy of upper adjacent vagina and cervix with biopsies and ECC Indications: LGSIL Pap smear with positive high-risk HPV Findings: SCJ is NOT visualized (colposcopy inadequate); nabothian cysts noted at 3:00 and 4:00; possible abnormal vessels at 12:00, 7:00 and 9:00 Biopsies:  ECC  Cervix biopsy 7:00  Cervix biopsy 9:00  Cervix biopsy 12:00  Description: Verbal consent is obtained. Patient is placed in the dorsal lithotomy position. A Graves' speculum was placed in the vagina to facilitate cervix visualization. Acetic acid solution is used to swab the cervix and vagina. Colposcope is used using normal and blue filter with above-noted findings being documented. ECC is performed with  serrated curette. Cervical biopsies at 7, 9, and 12:00 are obtained. Monsel solution is applied for hemostasis. Complications none. Estimated blood loss is less than 5 cc. Procedures well-tolerated.  ASSESSMENT: 1. LGSIL/positive high risk HPV Pap smear, first abnormal 2. Inadequate colposcopy; possible abnormal vessels noted on ectocervix 3. History of endometrial ablation for abnormal uterine bleeding  PLAN: 1. Colposcopy with biopsies as noted 2. Postprocedure instructions given 3. Return in 6 months for repeat Pap smear unless high-grade dysplasia is identified; patient will be contacted if further treatment is necessary.  Brayton Mars, MD  Note: This dictation was prepared with Dragon dictation along with smaller phrase technology. Any transcriptional errors that result from this process are unintentional.

## 2016-07-16 NOTE — Addendum Note (Signed)
Addended by: Elouise Munroe on: 07/16/2016 02:40 PM   Modules accepted: Orders

## 2016-07-21 LAB — PATHOLOGY

## 2016-07-24 ENCOUNTER — Other Ambulatory Visit: Payer: Self-pay | Admitting: Emergency Medicine

## 2016-07-24 DIAGNOSIS — G43709 Chronic migraine without aura, not intractable, without status migrainosus: Secondary | ICD-10-CM

## 2016-07-24 DIAGNOSIS — IMO0002 Reserved for concepts with insufficient information to code with codable children: Secondary | ICD-10-CM

## 2016-07-29 ENCOUNTER — Inpatient Hospital Stay: Payer: Managed Care, Other (non HMO) | Attending: Internal Medicine

## 2016-07-29 ENCOUNTER — Inpatient Hospital Stay: Payer: Managed Care, Other (non HMO)

## 2016-08-10 ENCOUNTER — Encounter: Payer: Self-pay | Admitting: Family Medicine

## 2016-08-10 ENCOUNTER — Ambulatory Visit (INDEPENDENT_AMBULATORY_CARE_PROVIDER_SITE_OTHER): Payer: Managed Care, Other (non HMO) | Admitting: Family Medicine

## 2016-08-10 VITALS — BP 133/76 | HR 64 | Temp 98.3°F | Resp 16 | Ht 63.0 in | Wt 184.6 lb

## 2016-08-10 DIAGNOSIS — E669 Obesity, unspecified: Secondary | ICD-10-CM

## 2016-08-10 DIAGNOSIS — Z9884 Bariatric surgery status: Secondary | ICD-10-CM

## 2016-08-10 DIAGNOSIS — I10 Essential (primary) hypertension: Secondary | ICD-10-CM

## 2016-08-10 DIAGNOSIS — G43109 Migraine with aura, not intractable, without status migrainosus: Secondary | ICD-10-CM | POA: Diagnosis not present

## 2016-08-10 MED ORDER — SUMATRIPTAN SUCCINATE 100 MG PO TABS: 100.0000 mg | ORAL_TABLET | Freq: Once | ORAL | 5 refills | Status: DC | PRN

## 2016-08-10 MED ORDER — AMLODIPINE BESYLATE 10 MG PO TABS: 10.0000 mg | ORAL_TABLET | Freq: Every day | ORAL | 3 refills | Status: DC

## 2016-08-10 NOTE — Progress Notes (Signed)
Subjective:    Patient ID: Crystal Levine, female    DOB: Dec 28, 1965, 51 y.o.   MRN: [id number]  Crystal Levine is a 51 y.o. female presenting on 08/10/2016 for Hypertension and Migraine (improved )  HPI   FOLLOW-UP Migraine Headaches - Last visit 07/13/16, ED follow-up for migraines, see note for background information - Today reports doing well, only x 1 migraine since last visit in 1 month. Unclear exact trigger for migraine, overall improved control on anti-HTN medication. Described last migraine HA as gradual worsening HA in afternoon and resolution with Excedrin migraine - Did not bring HA diary today - Requesting refill on Sumatriptan, did not get last rx filled needs new one, had tolerated 100mg  with repeat dose in 2 hours well - Denies numbness, tingling, weakness, chest pain, dyspnea, vision changes, pre-syncope or syncope  CHRONIC HTN: Reports doing much better since last visit 07/13/16 with new dx HTN, started on new anti-HTN therapy with Amlodipine 10mg  daily. She has checked BP outside office about 4x weekly, avg 120-140 SBP and 70-80 DBP, did have one reading last week of 1160/11 (during migraine headache only) Current Meds - Amlodipine 10mg  daily   Reports good compliance, took meds today. Tolerating well, w/o complaints. Lifestyle - tries to follow healthy diet, low sodium, regular exercise Denies CP, dyspnea, HA, edema, dizziness / lightheadedness  LOW TSH Has not scheduled Lab visit yet for TSH - Prior history of abnormal thyroid lab with 06/2015 and 10/2015, borderline low TSH, normal Free T4 and other thyroid studies, never on any treatment. No clear diagnosis in past.  S/p Gastric Bypass 2004 / Obesity BMI >32 - Requests further evaluation for weight management. Interested in nutritionist to help keep weight stable. Has had some gradual weight gain.   Social History  Substance Use Topics  . Smoking status: Never Smoker  . Smokeless tobacco: Never Used  . Alcohol  use No    Review of Systems Per HPI unless specifically indicated above     Objective:    BP 133/76   Pulse 64   Temp 98.3 F (36.8 C) (Oral)   Resp 16   Ht 5\' 3"  (1.6 m)   Wt 184 lb 9.6 oz (83.7 kg)   LMP  (LMP Unknown)   BMI 32.70 kg/m   Wt Readings from Last 3 Encounters:  08/10/16 184 lb 9.6 oz (83.7 kg)  07/16/16 182 lb (82.6 kg)  07/13/16 183 lb (83 kg)    Physical Exam  Constitutional: She appears well-developed and well-nourished. No distress.  Well-appearing, comfortable, cooperative  HENT:  Head: Normocephalic and atraumatic.  Mouth/Throat: Oropharynx is clear and moist.  Eyes: Conjunctivae are normal. Right eye exhibits no discharge. Left eye exhibits no discharge.  Neck: Normal range of motion. Neck supple.  Cardiovascular: Normal rate, regular rhythm, normal heart sounds and intact distal pulses.   No murmur heard. Pulmonary/Chest: Effort normal.  Musculoskeletal: She exhibits no edema.  Neurological: She is alert.  Skin: Skin is warm and dry. She is not diaphoretic.  Psychiatric: She has a normal mood and affect. Her behavior is normal.  Nursing note and vitals reviewed.      Assessment & Plan:   Problem List Items Addressed This Visit    S/P gastric bypass    Slight weight gain recently but overall stable Concern with wt gain >14 years s/p gastric bypass surgery - Referral to [place] for nutrition management - Reviewed potential weight loss medications, some brief dietary  counseling, prefer to avoid adding wt loss med - May schedule in future self referral to [facility] for wt management vs [facility] Management clinic Dr Dennard Nip if needed      Relevant Orders   Ambulatory referral to Nutrition and Diabetic Education   Obesity (BMI 30.0-34.9)    Slight weight gain recently but overall stable Concern with wt gain >14 years s/p gastric bypass surgery - Referral to [place] for nutrition management - Reviewed  potential weight loss medications, some brief dietary counseling, prefer to avoid adding wt loss med - May schedule in future self referral to [facility] for wt management vs [facility] Management clinic Dr Dennard Nip if needed      Relevant Orders   Ambulatory referral to Nutrition and Diabetic Education   Migraine with aura and without status migrainosus, not intractable    Improved migraine control on anti-HTN therapy, x 1 migraine in 1 month. - Currently without active HA  Plan: 1. Refill rx Sumatriptan 100mg  #12 tabs +refills - reviewed instructions take one tab for severe HA, may repeat dose within 2 hr if persistent, no more in 24 hours - consider sumatriptan nasal vs onzetra in future 2. Also recommend NSAID, Excedrin Migraine, Tylenol 3. Continue Amlodipine 10mg  daily - reviewed future migraine prophylaxis with added BB if needed, hold for now only x 1 migraine in 1 month, defer adding med 4. Continue headache diary, follow-up in future 5. Follow-up 3 months, sooner as needed      Relevant Medications   amLODipine (NORVASC) 10 MG tablet   SUMAtriptan (IMITREX) 100 MG tablet   Essential hypertension - Primary    Improved control HTN, recent diagnosis. Now improved migraines as well on anti-HTN meds - Home readings improved - No complications  Plan: 1. Continue Amlodipine 10mg  daily, refill #90 day supply 2. Continue monitor BP outside office 3. Future labs per Heme / awaiting TSH lab 4. Follow-up 3 months HTN      Relevant Medications   amLODipine (NORVASC) 10 MG tablet      Meds ordered this encounter  Medications  . amLODipine (NORVASC) 10 MG tablet    Sig: Take 1 tablet (10 mg total) by mouth daily.    Dispense:  90 tablet    Refill:  3  . SUMAtriptan (IMITREX) 100 MG tablet    Sig: Take 1 tablet (100 mg total) by mouth once as needed for migraine. May repeat one dose in 2 hours if headache persists, for max dose 24 hour    Dispense:  12 tablet    Refill:  5        Follow up plan: Return in about 3 months (around 11/09/2016) for HTN, Migraines.  [name], [facility] 08/10/2016, 10:25 PM

## 2016-08-10 NOTE — Assessment & Plan Note (Signed)
Improved migraine control on anti-HTN therapy, x 1 migraine in 1 month. - Currently without active HA  Plan: 1. Refill rx Sumatriptan 100mg  #12 tabs +refills - reviewed instructions take one tab for severe HA, may repeat dose within 2 hr if persistent, no more in 24 hours - consider sumatriptan nasal vs onzetra in future 2. Also recommend NSAID, Excedrin Migraine, Tylenol 3. Continue Amlodipine 10mg  daily - reviewed future migraine prophylaxis with added BB if needed, hold for now only x 1 migraine in 1 month, defer adding med 4. Continue headache diary, follow-up in future 5. Follow-up 3 months, sooner as needed

## 2016-08-10 NOTE — Assessment & Plan Note (Signed)
Slight weight gain recently but overall stable Concern with wt gain >14 years s/p gastric bypass surgery - Referral to [place] for nutrition management - Reviewed potential weight loss medications, some brief dietary counseling, prefer to avoid adding wt loss med - May schedule in future self referral to [facility] for wt management vs [facility] Management clinic Dr Dennard Nip if needed

## 2016-08-10 NOTE — Assessment & Plan Note (Signed)
Slight weight gain recently but overall stable Concern with wt gain >14 years s/p gastric bypass surgery - Referral to [place] for nutrition management - Reviewed potential weight loss medications, some brief dietary counseling, prefer to avoid adding wt loss med - May schedule in future self referral to [facility] for wt management vs [facility] Management clinic Dr Dennard Nip if needed

## 2016-08-10 NOTE — Patient Instructions (Addendum)
Thank you for coming in to clinic today.  1. Refilled Sumatriptan for migraines, keep up diary and follow-up as needed  Refilled Amlodipinen 90 day supply, no change today, keep checking BP  WEIGHT LOSS  Self Referral [facility] [address], [place], [place] [address] Hours: 8am - 5pm Main: [place] Gayla Medicus), RN CNM, midwife  Weight Loss Program (BMI >27, obesity w/o co-morbidities) - Will check thyroid studies - B12 injection - Phentermine  Referral to Nutritionist - for weight management  [place]   Address: [address] [place], [place], [place] [address] Phone: [phone]  ----------- Consider  Dennard Nip, MD Medical Weight Loss Management Office should be in [facility] [phone]  Check to see if would be covered by insurance.  ------------------ Antidepressant Type - Venlafaxine, Amitriptyline Blood pressure meds - Metoprolol, Propanolol, Verapamil Anti Headache/Seizure/Nerve medications - Topamax, Gabapentin   You will be due for NON FASTING BLOOD WORK - for Thyroid  Please schedule a follow-up appointment with Dr. Parks Ranger in 3 months for HTN, Migraines  If you have any other questions or concerns, please feel free to call the clinic or send a message through [place]. You may also schedule an earlier appointment if necessary.  [name], DO [place]

## 2016-08-10 NOTE — Assessment & Plan Note (Signed)
Improved control HTN, recent diagnosis. Now improved migraines as well on anti-HTN meds - Home readings improved - No complications  Plan: 1. Continue Amlodipine 10mg  daily, refill #90 day supply 2. Continue monitor BP outside office 3. Future labs per Heme / awaiting TSH lab 4. Follow-up 3 months HTN

## 2016-08-24 ENCOUNTER — Encounter: Payer: Managed Care, Other (non HMO) | Attending: Family Medicine | Admitting: Dietician

## 2016-08-24 VITALS — Ht 63.0 in | Wt 184.6 lb

## 2016-08-24 DIAGNOSIS — Z9884 Bariatric surgery status: Secondary | ICD-10-CM | POA: Diagnosis not present

## 2016-08-24 DIAGNOSIS — Z713 Dietary counseling and surveillance: Secondary | ICD-10-CM | POA: Diagnosis present

## 2016-08-24 DIAGNOSIS — E6609 Other obesity due to excess calories: Secondary | ICD-10-CM

## 2016-08-24 DIAGNOSIS — E669 Obesity, unspecified: Secondary | ICD-10-CM | POA: Insufficient documentation

## 2016-08-24 DIAGNOSIS — Z6832 Body mass index (BMI) 32.0-32.9, adult: Secondary | ICD-10-CM

## 2016-08-24 NOTE — Progress Notes (Signed)
Medical Nutrition Therapy: Visit start time: 1320  end time: 5176  Assessment:  Diagnosis: weight gain after gastric bypass surgery 2004 Past Medical history: HTN, migraines Psychosocial issues/ stress concerns: none  Current weight: 184.6lbs  Height: 5'3" Medications, supplement changes: reconciled list in medical record  Progress and evaluation: Patient reports losing 117lbs after surgery in 2004, but has regained some weight in the past 3 years, started making changes and losing recently but, has now plateaued. Has had iron and B12 anemia since 2006, gets infusions regularly. She states most of her weight gain came about during a time of increased stress. Husband is supportive, present at visit, and prepares some meals at home.     Physical activity: walking 30 minutes, 2-3 times a week  Dietary Intake:  Usual eating pattern includes 3 meals and 2 snacks per day. Dining out frequency: average 4 meals per week.  Breakfast: egg biscuit from restaurant 3x a week; shredded wheat cereal Snack: protein bar with nuts and dried fruit, trail mix, pretzels, occasionally cookies, applesauce or other fruits Lunch: leftovers from previous dinner Snack: same as am Supper: lamb, fish, pork chops-- husband grills and bakes, vegetables, occasional pizza or pasta Snack: 9-10pm chips, ice cream sandwich Beverages: water, 1/2 unsweetened tea (heard bad things about Splenda), diet soda 2x a week  Nutrition Care Education: Topics covered: weight management Basic nutrition: basic food groups, appropriate nutrient balance, appropriate meal and snack schedule, general nutrition guidelines    Weight control: determining reasonable weight loss rate; estimated needs at 1200kcal daily and advised 135g carbohydrate daily with at least 4 servings of grains, 2-3 servings of fruit, and 3 servings of calcium-rich foods; discussed importance of low fat and low sugar food choices; benefits of tracking food intake; role of  regular physical activity and options for increasing general activity. Provided bariatric menus for 1200kcal.   Nutritional Diagnosis:  [place]-3.3 Overweight/obesity As related to excess calories and inactivity.  As evidenced by BMI 32, patient report.  Intervention: Instruction as noted above.   Set goals with input from patient.    Patient and husband need options that will work for the whole family; provided meal options.    Patient has definite plans for increasing exercise.   Education Materials given:  Marland Kitchen Bariatric menus, 1200kcal (AND) . Food lists/ Planning A Balanced Meal . Sample meal pattern/ menus . Goals/ instructions  Learner/ who was taught:  . Patient  . Spouse/ partner  Level of understanding: Marland Kitchen Verbalizes/ demonstrates competency  Demonstrated degree of understanding via:   Teach back Learning barriers: . None  Willingness to learn/ readiness for change: . Eager, change in progress  Monitoring and Evaluation:  Dietary intake, exercise, and body weight      follow up: 09/28/16

## 2016-08-24 NOTE — Patient Instructions (Signed)
   Aim for 1200-calories daily, with at least 9 servings of carb foods to meet basic nutrition needs.   Try a phone app for tracking food intake, such as MyFitnessPal or Lose It. Both are free for basic tracking.   Make sure to control portions of starches.   Continue with regular exercise to keep up metabolism and burn calories.

## 2016-08-25 ENCOUNTER — Inpatient Hospital Stay: Payer: Managed Care, Other (non HMO) | Attending: Internal Medicine

## 2016-08-25 ENCOUNTER — Inpatient Hospital Stay: Payer: Managed Care, Other (non HMO)

## 2016-08-25 VITALS — BP 126/83 | HR 70 | Resp 20

## 2016-08-25 DIAGNOSIS — F329 Major depressive disorder, single episode, unspecified: Secondary | ICD-10-CM | POA: Insufficient documentation

## 2016-08-25 DIAGNOSIS — Z8669 Personal history of other diseases of the nervous system and sense organs: Secondary | ICD-10-CM | POA: Diagnosis not present

## 2016-08-25 DIAGNOSIS — D509 Iron deficiency anemia, unspecified: Secondary | ICD-10-CM | POA: Diagnosis not present

## 2016-08-25 DIAGNOSIS — E538 Deficiency of other specified B group vitamins: Secondary | ICD-10-CM | POA: Insufficient documentation

## 2016-08-25 DIAGNOSIS — Z79899 Other long term (current) drug therapy: Secondary | ICD-10-CM | POA: Insufficient documentation

## 2016-08-25 DIAGNOSIS — Z9884 Bariatric surgery status: Secondary | ICD-10-CM | POA: Insufficient documentation

## 2016-08-25 DIAGNOSIS — D508 Other iron deficiency anemias: Secondary | ICD-10-CM

## 2016-08-25 DIAGNOSIS — F419 Anxiety disorder, unspecified: Secondary | ICD-10-CM | POA: Diagnosis not present

## 2016-08-25 DIAGNOSIS — R5383 Other fatigue: Secondary | ICD-10-CM | POA: Diagnosis not present

## 2016-08-25 DIAGNOSIS — Z9049 Acquired absence of other specified parts of digestive tract: Secondary | ICD-10-CM | POA: Diagnosis not present

## 2016-08-25 DIAGNOSIS — K909 Intestinal malabsorption, unspecified: Secondary | ICD-10-CM

## 2016-08-25 LAB — COMPREHENSIVE METABOLIC PANEL
ALK PHOS: 76 U/L (ref 38–126)
ALT: 13 U/L — AB (ref 14–54)
ANION GAP: 7 (ref 5–15)
AST: 18 U/L (ref 15–41)
Albumin: 4.2 g/dL (ref 3.5–5.0)
BUN: 13 mg/dL (ref 6–20)
CALCIUM: 8.8 mg/dL — AB (ref 8.9–10.3)
CO2: 22 mmol/L (ref 22–32)
CREATININE: 0.55 mg/dL (ref 0.44–1.00)
Chloride: 106 mmol/L (ref 101–111)
GFR calc non Af Amer: >60 mL/min (ref >60)
Glucose, Bld: 82 mg/dL (ref 65–99)
Potassium: 3.9 mmol/L (ref 3.5–5.1)
Sodium: 135 mmol/L (ref 135–145)
TOTAL PROTEIN: 7.5 g/dL (ref 6.5–8.1)
Total Bilirubin: 0.4 mg/dL (ref 0.3–1.2)

## 2016-08-25 LAB — IRON AND TIBC
IRON: 51 ug/dL (ref 28–170)
Saturation Ratios: 16 % (ref 10.4–31.8)
TIBC: 323 ug/dL (ref 250–450)
UIBC: 272 ug/dL

## 2016-08-25 LAB — CBC WITH DIFFERENTIAL/PLATELET
Basophils Absolute: 0 10*3/uL (ref 0–0.1)
Basophils Relative: 1 %
EOS ABS: 0.1 10*3/uL (ref 0–0.7)
Eosinophils Relative: 2 %
HCT: 41.3 % (ref 35.0–47.0)
Hemoglobin: 14.5 g/dL (ref 12.0–16.0)
LYMPHS ABS: 1.7 10*3/uL (ref 1.0–3.6)
LYMPHS PCT: 36 %
MCH: 31.6 pg (ref 26.0–34.0)
MCHC: 35.2 g/dL (ref 32.0–36.0)
MCV: 89.8 fL (ref 80.0–100.0)
MONOS PCT: 7 %
Monocytes Absolute: 0.3 10*3/uL (ref 0.2–0.9)
NEUTROS PCT: 54 %
Neutro Abs: 2.5 10*3/uL (ref 1.4–6.5)
Platelets: 177 10*3/uL (ref 150–440)
RBC: 4.6 MIL/uL (ref 3.80–5.20)
RDW: 12.7 % (ref 11.5–14.5)
WBC: 4.6 10*3/uL (ref 3.6–11.0)

## 2016-08-25 LAB — FERRITIN: Ferritin: 113 ng/mL (ref 11–307)

## 2016-08-25 MED ORDER — CYANOCOBALAMIN 1000 MCG/ML IJ SOLN
1000.0000 ug | INTRAMUSCULAR | Status: DC
  Administered 2016-08-25: 1000 ug via INTRAMUSCULAR
  Filled 2016-08-25: qty 1

## 2016-08-25 MED ORDER — DIPHENHYDRAMINE HCL 50 MG/ML IJ SOLN
25.0000 mg | Freq: Once | INTRAMUSCULAR | Status: AC
  Administered 2016-08-25: 25 mg via INTRAVENOUS
  Filled 2016-08-25: qty 1

## 2016-08-25 MED ORDER — SODIUM CHLORIDE 0.9 % IV SOLN
Freq: Once | INTRAVENOUS | Status: AC
  Administered 2016-08-25: 14:00:00 via INTRAVENOUS
  Filled 2016-08-25: qty 1000

## 2016-08-25 MED ORDER — FERUMOXYTOL INJECTION 510 MG/17 ML
510.0000 mg | Freq: Once | INTRAVENOUS | Status: AC
  Administered 2016-08-25: 510 mg via INTRAVENOUS
  Filled 2016-08-25: qty 17

## 2016-09-28 ENCOUNTER — Ambulatory Visit: Payer: Self-pay | Admitting: Dietician

## 2016-10-02 ENCOUNTER — Telehealth: Payer: Self-pay | Admitting: Dietician

## 2016-10-02 NOTE — Telephone Encounter (Signed)
Called patient to reschedule appointment, which she missed on 09/28/16. Left voicemail message requesting a call back.

## 2016-10-12 ENCOUNTER — Encounter: Payer: Self-pay | Admitting: Dietician

## 2016-10-12 NOTE — Progress Notes (Signed)
Have not heard from patient to reschedule missed appointment from 09/28/16. Sent letter to MD.

## 2016-10-28 ENCOUNTER — Inpatient Hospital Stay: Payer: Managed Care, Other (non HMO) | Admitting: Internal Medicine

## 2016-10-28 ENCOUNTER — Inpatient Hospital Stay: Payer: Managed Care, Other (non HMO)

## 2016-10-28 NOTE — Progress Notes (Deleted)
[facility] NOTE  Patient Care Team: Olin Hauser, DO as PCP - General (Family Medicine)  CHIEF COMPLAINTS/PURPOSE OF CONSULTATION:   #  Iron Def sec to gastric bypass-   # B12 def on IM B12 injections  # GASTRIC BY PASS [2004]   HISTORY OF PRESENTING ILLNESS:  Crystal Levine 51 y.o.  female  With above history of gastric bypass- and consequent malabsorption of iron B12 is here for follow-up  Patient feels tired in the last few weeks. Last received IV iron approximately 3 months ago. Patient is getting B12 injections at home/pulse weekly.   ROS: A complete 10 point review of system is done which is negative except mentioned above in history of present illness  MEDICAL HISTORY:  Past Medical History:  Diagnosis Date  . Anemia   . Anxiety   . Depression   . Hypertension   . Migraine    In past    SURGICAL HISTORY: Past Surgical History:  Procedure Laterality Date  . CHOLECYSTECTOMY    . DILATION AND CURETTAGE OF UTERUS  2003   2 Miscarriages  . DILITATION & CURRETTAGE/HYSTROSCOPY WITH NOVASURE ABLATION N/A 12/16/2015   Procedure: DILATATION & CURETTAGE/HYSTEROSCOPY WITH NOVASURE ABLATION;  Surgeon: Brayton Mars, MD;  Location: [facility] ORS;  Service: Gynecology;  Laterality: N/A;  . GALLBLADDER SURGERY  2003  . GASTRIC BYPASS  2004  . HERNIA REPAIR  2005    SOCIAL HISTORY: Social History   Social History  . Marital status: Divorced    Spouse name: N/A  . Number of children: N/A  . Years of education: N/A   Occupational History  . Not on file.   Social History Main Topics  . Smoking status: Never Smoker  . Smokeless tobacco: Never Used  . Alcohol use No  . Drug use: No  . Sexual activity: Yes     Comment: vasectomy   Other Topics Concern  . Not on file   Social History Narrative  . No narrative on file    FAMILY HISTORY: Family History  Problem Relation Age of Onset  . Adopted: Yes  . Family history  unknown: Yes    ALLERGIES:  has No Known Allergies.  MEDICATIONS:  Current Outpatient Prescriptions  Medication Sig Dispense Refill  . amLODipine (NORVASC) 10 MG tablet Take 1 tablet (10 mg total) by mouth daily. 90 tablet 3  . baclofen (LIORESAL) 10 MG tablet Take 1 tablet (10 mg total) by mouth 3 (three) times daily. (Patient not taking: Reported on 08/24/2016) 30 each 0  . butalbital-acetaminophen-caffeine (FIORICET, ESGIC) 50-325-40 MG tablet Take 1-2 tablets by mouth every 6 (six) hours as needed for headache. 20 tablet 0  . cyanocobalamin (,VITAMIN B-12,) 1000 MCG/ML injection Inject 1 mL (1,000 mcg total) into the muscle every 30 (thirty) days. 1 mL 11  . ipratropium (ATROVENT) 0.06 % nasal spray Place 2 sprays into both nostrils 4 (four) times daily. For up to 5-7 days then stop. (Patient not taking: Reported on 08/24/2016) 15 mL 0  . Magnesium 250 MG TABS Take 1 tablet (250 mg total) by mouth at bedtime. (Patient not taking: Reported on 08/24/2016) 30 tablet 11  . SUMAtriptan (IMITREX) 100 MG tablet Take 1 tablet (100 mg total) by mouth once as needed for migraine. May repeat one dose in 2 hours if headache persists, for max dose 24 hour 12 tablet 5  . Vitamin D, Ergocalciferol, (DRISDOL) 50000 units CAPS capsule Take 1 capsule (50,000 Units  total) by mouth every 7 (seven) days. 12 capsule 0   No current facility-administered medications for this visit.       Marland Kitchen  PHYSICAL EXAMINATION:   There were no vitals filed for this visit. There were no vitals filed for this visit.  GENERAL: Well-nourished well-developed; Alert, no distress and comfortable.  Accompanied by her husband. EYES: no pallor or icterus OROPHARYNX: no thrush or ulceration; good dentition  NECK: supple, no masses felt LYMPH:  no palpable lymphadenopathy in the cervical, axillary or inguinal regions LUNGS: clear to auscultation and  No wheeze or crackles HEART/CVS: regular rate & rhythm and no murmurs; No lower  extremity edema ABDOMEN: abdomen soft, non-tender and normal bowel sounds Musculoskeletal:no cyanosis of digits and no clubbing  PSYCH: alert & oriented x 3 with fluent speech NEURO: no focal motor/sensory deficits SKIN:  no rashes or significant lesions  LABORATORY DATA:  I have reviewed the data as listed Lab Results  Component Value Date   WBC 4.6 08/25/2016   HGB 14.5 08/25/2016   HCT 41.3 08/25/2016   MCV 89.8 08/25/2016   PLT 177 08/25/2016    Recent Labs  01/27/16 1457 08/25/16 1251  NA 138 135  K 3.7 3.9  CL 111 106  CO2 20* 22  GLUCOSE 93 82  BUN 14 13  CREATININE 0.60 0.55  CALCIUM 8.8* 8.8*  GFRNONAA >60 >60  GFRAA >60 >60  PROT 7.7 7.5  ALBUMIN 4.5 4.2  AST 22 18  ALT 12* 13*  ALKPHOS 85 76  BILITOT 0.3 0.4      ASSESSMENT & PLAN:   No problem-specific Assessment & Plan notes found for this encounter. Cammie Sickle, MD 10/28/2016 8:12 AM

## 2016-10-28 NOTE — Assessment & Plan Note (Deleted)
#   Iron deficiency-  secondary to gastric bypass/malabsorption. Today hemoglobin is 13 however ferritin is low.  Recommend Feraheme every 3 months with Benadryl premedication.    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 Will give script today for B12 injections.  #  Patient follow-up with me in 6 months with IV Feraheme/ labs  Few days prior; Feraheme labs in 3 months

## 2016-11-06 ENCOUNTER — Other Ambulatory Visit: Payer: Self-pay

## 2016-11-06 DIAGNOSIS — E559 Vitamin D deficiency, unspecified: Secondary | ICD-10-CM

## 2016-11-06 NOTE — Telephone Encounter (Signed)
Vitamin D level should be checked prior to continuing this medication.  Pt can take 1,000 IU daily of over the counter Vitamin D3 supplement which should be sufficient for maintaining normal levels.

## 2016-11-16 ENCOUNTER — Ambulatory Visit: Payer: Managed Care, Other (non HMO) | Admitting: Family Medicine

## 2016-11-20 ENCOUNTER — Inpatient Hospital Stay: Payer: Managed Care, Other (non HMO)

## 2016-11-20 ENCOUNTER — Inpatient Hospital Stay: Payer: Managed Care, Other (non HMO) | Admitting: Internal Medicine

## 2016-11-20 NOTE — Assessment & Plan Note (Deleted)
#   Iron deficiency-  secondary to gastric bypass/malabsorption. Today hemoglobin is 13 however ferritin is low.  Recommend Feraheme every 3 months with Benadryl premedication.    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 Will give script today for B12 injections.  #  Patient follow-up with me in 6 months with IV Feraheme/ labs  Few days prior; Feraheme labs in 3 months

## 2016-11-20 NOTE — Progress Notes (Deleted)
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  female  With above history of gastric bypass- and consequent malabsorption of iron B12 is here for follow-up  Patient feels tired in the last few weeks. Last received IV iron approximately 3 months ago. Patient is getting B12 injections at home/pulse weekly.   ROS: A complete 10 point review of system is done which is negative except mentioned above in history of present illness  MEDICAL HISTORY:  Past Medical History:  Diagnosis Date  . Anemia   . Anxiety   . Depression   . Hypertension   . Migraine    In past    SURGICAL HISTORY: Past Surgical History:  Procedure Laterality Date  . CHOLECYSTECTOMY    . DILATION AND CURETTAGE OF UTERUS  2003   2 Miscarriages  . DILITATION & CURRETTAGE/HYSTROSCOPY WITH NOVASURE ABLATION N/A 12/16/2015   Procedure: DILATATION & CURETTAGE/HYSTEROSCOPY WITH NOVASURE ABLATION;  Surgeon: Brayton Mars, MD;  Location: [facility] ORS;  Service: Gynecology;  Laterality: N/A;  . GALLBLADDER SURGERY  2003  . GASTRIC BYPASS  2004  . HERNIA REPAIR  2005    SOCIAL HISTORY: Social History   Social History  . Marital status: Divorced    Spouse name: N/A  . Number of children: N/A  . Years of education: N/A   Occupational History  . Not on file.   Social History Main Topics  . Smoking status: Never Smoker  . Smokeless tobacco: Never Used  . Alcohol use No  . Drug use: No  . Sexual activity: Yes     Comment: vasectomy   Other Topics Concern  . Not on file   Social History Narrative  . No narrative on file    FAMILY HISTORY: Family History  Problem Relation Age of Onset  . Adopted: Yes  . Family history  unknown: Yes    ALLERGIES:  has No Known Allergies.  MEDICATIONS:  Current Outpatient Prescriptions  Medication Sig Dispense Refill  . amLODipine (NORVASC) 10 MG tablet Take 1 tablet (10 mg total) by mouth daily. 90 tablet 3  . baclofen (LIORESAL) 10 MG tablet Take 1 tablet (10 mg total) by mouth 3 (three) times daily. (Patient not taking: Reported on 08/24/2016) 30 each 0  . butalbital-acetaminophen-caffeine (FIORICET, ESGIC) 50-325-40 MG tablet Take 1-2 tablets by mouth every 6 (six) hours as needed for headache. 20 tablet 0  . cyanocobalamin (,VITAMIN B-12,) 1000 MCG/ML injection Inject 1 mL (1,000 mcg total) into the muscle every 30 (thirty) days. 1 mL 11  . ipratropium (ATROVENT) 0.06 % nasal spray Place 2 sprays into both nostrils 4 (four) times daily. For up to 5-7 days then stop. (Patient not taking: Reported on 08/24/2016) 15 mL 0  . Magnesium 250 MG TABS Take 1 tablet (250 mg total) by mouth at bedtime. (Patient not taking: Reported on 08/24/2016) 30 tablet 11  . SUMAtriptan (IMITREX) 100 MG tablet Take 1 tablet (100 mg total) by mouth once as needed for migraine. May repeat one dose in 2 hours if headache persists, for max dose 24 hour 12 tablet 5  . Vitamin D, Ergocalciferol, (DRISDOL) 50000 units CAPS capsule Take 1 capsule (50,000 Units  total) by mouth every 7 (seven) days. 12 capsule 0   No current facility-administered medications for this visit.       Marland Kitchen  PHYSICAL EXAMINATION:   There were no vitals filed for this visit. There were no vitals filed for this visit.  GENERAL: Well-nourished well-developed; Alert, no distress and comfortable.  Accompanied by her husband. EYES: no pallor or icterus OROPHARYNX: no thrush or ulceration; good dentition  NECK: supple, no masses felt LYMPH:  no palpable lymphadenopathy in the cervical, axillary or inguinal regions LUNGS: clear to auscultation and  No wheeze or crackles HEART/CVS: regular rate & rhythm and no murmurs; No lower  extremity edema ABDOMEN: abdomen soft, non-tender and normal bowel sounds Musculoskeletal:no cyanosis of digits and no clubbing  PSYCH: alert & oriented x 3 with fluent speech NEURO: no focal motor/sensory deficits SKIN:  no rashes or significant lesions  LABORATORY DATA:  I have reviewed the data as listed Lab Results  Component Value Date   WBC 4.6 08/25/2016   HGB 14.5 08/25/2016   HCT 41.3 08/25/2016   MCV 89.8 08/25/2016   PLT 177 08/25/2016    Recent Labs  01/27/16 1457 08/25/16 1251  NA 138 135  K 3.7 3.9  CL 111 106  CO2 20* 22  GLUCOSE 93 82  BUN 14 13  CREATININE 0.60 0.55  CALCIUM 8.8* 8.8*  GFRNONAA >60 >60  GFRAA >60 >60  PROT 7.7 7.5  ALBUMIN 4.5 4.2  AST 22 18  ALT 12* 13*  ALKPHOS 85 76  BILITOT 0.3 0.4      ASSESSMENT & PLAN:   No problem-specific Assessment & Plan notes found for this encounter. Cammie Sickle, MD 11/20/2016 1:18 PM

## 2016-11-26 ENCOUNTER — Ambulatory Visit
Admission: RE | Admit: 2016-11-26 | Discharge: 2016-11-26 | Disposition: A | Discharge status: Home / Self Care | Payer: Managed Care, Other (non HMO) | Source: Ambulatory Visit | Attending: Interventional Radiology | Admitting: Interventional Radiology

## 2016-11-26 ENCOUNTER — Ambulatory Visit: Payer: Self-pay | Admitting: Physician Assistant

## 2016-11-26 ENCOUNTER — Encounter: Payer: Self-pay | Admitting: Physician Assistant

## 2016-11-26 ENCOUNTER — Ambulatory Visit
Admission: RE | Admit: 2016-11-26 | Discharge: 2016-11-26 | Disposition: A | Discharge status: Home / Self Care | Payer: Managed Care, Other (non HMO) | Source: Ambulatory Visit | Attending: Physician Assistant | Admitting: Physician Assistant

## 2016-11-26 VITALS — BP 152/100 | HR 62 | Temp 98.1°F

## 2016-11-26 DIAGNOSIS — R1030 Lower abdominal pain, unspecified: Secondary | ICD-10-CM

## 2016-11-26 DIAGNOSIS — R197 Diarrhea, unspecified: Secondary | ICD-10-CM

## 2016-11-26 MED ORDER — METRONIDAZOLE 500 MG PO TABS: 500.0000 mg | ORAL_TABLET | Freq: Three times a day (TID) | ORAL | 0 refills | Status: DC

## 2016-11-26 MED ORDER — CIPROFLOXACIN HCL 500 MG PO TABS: 500.0000 mg | ORAL_TABLET | Freq: Two times a day (BID) | ORAL | 0 refills | Status: DC

## 2016-11-26 NOTE — Progress Notes (Signed)
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

## 2016-11-27 ENCOUNTER — Ambulatory Visit: Payer: Self-pay | Admitting: Physician Assistant

## 2016-12-03 NOTE — Addendum Note (Signed)
Addended by: Rudene Anda T on: 12/03/2016 08:28 AM   Modules accepted: Orders

## 2016-12-15 ENCOUNTER — Ambulatory Visit: Payer: Managed Care, Other (non HMO) | Admitting: Family Medicine

## 2017-01-04 ENCOUNTER — Ambulatory Visit: Payer: Managed Care, Other (non HMO) | Admitting: Gastroenterology

## 2017-01-21 ENCOUNTER — Encounter: Payer: Managed Care, Other (non HMO) | Admitting: Obstetrics and Gynecology

## 2017-02-01 ENCOUNTER — Ambulatory Visit: Payer: Managed Care, Other (non HMO) | Admitting: Gastroenterology

## 2017-02-11 ENCOUNTER — Encounter: Payer: Self-pay | Admitting: Gastroenterology

## 2017-03-05 ENCOUNTER — Other Ambulatory Visit: Payer: Self-pay

## 2017-03-08 ENCOUNTER — Ambulatory Visit: Payer: Self-pay | Admitting: Internal Medicine

## 2017-03-08 ENCOUNTER — Ambulatory Visit: Payer: Self-pay

## 2017-03-16 ENCOUNTER — Encounter: Payer: Managed Care, Other (non HMO) | Admitting: Obstetrics and Gynecology

## 2017-03-22 ENCOUNTER — Inpatient Hospital Stay: Payer: Managed Care, Other (non HMO)

## 2017-03-22 DIAGNOSIS — K909 Intestinal malabsorption, unspecified: Secondary | ICD-10-CM | POA: Insufficient documentation

## 2017-03-22 DIAGNOSIS — Z79899 Other long term (current) drug therapy: Secondary | ICD-10-CM | POA: Insufficient documentation

## 2017-03-22 DIAGNOSIS — F419 Anxiety disorder, unspecified: Secondary | ICD-10-CM | POA: Insufficient documentation

## 2017-03-22 DIAGNOSIS — F329 Major depressive disorder, single episode, unspecified: Secondary | ICD-10-CM | POA: Insufficient documentation

## 2017-03-22 DIAGNOSIS — Z9884 Bariatric surgery status: Secondary | ICD-10-CM | POA: Diagnosis not present

## 2017-03-22 DIAGNOSIS — I10 Essential (primary) hypertension: Secondary | ICD-10-CM | POA: Diagnosis not present

## 2017-03-22 DIAGNOSIS — E538 Deficiency of other specified B group vitamins: Secondary | ICD-10-CM | POA: Insufficient documentation

## 2017-03-22 LAB — CBC WITH DIFFERENTIAL/PLATELET
Basophils Absolute: 0 10*3/uL (ref 0–0.1)
Basophils Relative: 1 %
EOS ABS: 0.1 10*3/uL (ref 0–0.7)
EOS PCT: 2 %
HCT: 43.4 % (ref 35.0–47.0)
HEMOGLOBIN: 14.7 g/dL (ref 12.0–16.0)
LYMPHS ABS: 1.7 10*3/uL (ref 1.0–3.6)
Lymphocytes Relative: 42 %
MCH: 31 pg (ref 26.0–34.0)
MCHC: 33.9 g/dL (ref 32.0–36.0)
MCV: 91.6 fL (ref 80.0–100.0)
Monocytes Absolute: 0.3 10*3/uL (ref 0.2–0.9)
Monocytes Relative: 7 %
NEUTROS PCT: 48 %
Neutro Abs: 2 10*3/uL (ref 1.4–6.5)
PLATELETS: 186 10*3/uL (ref 150–440)
RBC: 4.73 MIL/uL (ref 3.80–5.20)
RDW: 13 % (ref 11.5–14.5)
WBC: 4.1 10*3/uL (ref 3.6–11.0)

## 2017-03-22 LAB — COMPREHENSIVE METABOLIC PANEL
ALBUMIN: 4.3 g/dL (ref 3.5–5.0)
ALK PHOS: 65 U/L (ref 38–126)
ALT: 15 U/L (ref 14–54)
AST: 21 U/L (ref 15–41)
Anion gap: 9 (ref 5–15)
BUN: 15 mg/dL (ref 6–20)
CALCIUM: 8.8 mg/dL — AB (ref 8.9–10.3)
CO2: 21 mmol/L — AB (ref 22–32)
CREATININE: 0.57 mg/dL (ref 0.44–1.00)
Chloride: 106 mmol/L (ref 101–111)
GFR calc non Af Amer: >60 mL/min (ref >60)
Glucose, Bld: 92 mg/dL (ref 65–99)
Potassium: 4 mmol/L (ref 3.5–5.1)
SODIUM: 136 mmol/L (ref 135–145)
Total Bilirubin: 0.5 mg/dL (ref 0.3–1.2)
Total Protein: 7.6 g/dL (ref 6.5–8.1)

## 2017-03-22 LAB — FERRITIN: Ferritin: 227 ng/mL (ref 11–307)

## 2017-03-22 LAB — IRON AND TIBC
IRON: 31 ug/dL (ref 28–170)
Saturation Ratios: 10 % — ABNORMAL LOW (ref 10.4–31.8)
TIBC: 321 ug/dL (ref 250–450)
UIBC: 290 ug/dL

## 2017-03-24 ENCOUNTER — Inpatient Hospital Stay: Payer: Managed Care, Other (non HMO)

## 2017-03-24 ENCOUNTER — Encounter: Payer: Self-pay | Admitting: Internal Medicine

## 2017-03-24 ENCOUNTER — Inpatient Hospital Stay: Payer: Managed Care, Other (non HMO) | Attending: Internal Medicine | Admitting: Internal Medicine

## 2017-03-24 VITALS — BP 118/69 | HR 69

## 2017-03-24 VITALS — BP 133/82 | HR 50 | Temp 98.1°F | Resp 16 | Wt 181.0 lb

## 2017-03-24 DIAGNOSIS — K909 Intestinal malabsorption, unspecified: Secondary | ICD-10-CM

## 2017-03-24 DIAGNOSIS — E538 Deficiency of other specified B group vitamins: Secondary | ICD-10-CM

## 2017-03-24 DIAGNOSIS — Z79899 Other long term (current) drug therapy: Secondary | ICD-10-CM | POA: Diagnosis not present

## 2017-03-24 DIAGNOSIS — D508 Other iron deficiency anemias: Secondary | ICD-10-CM

## 2017-03-24 MED ORDER — DIPHENHYDRAMINE HCL 50 MG/ML IJ SOLN
25.0000 mg | Freq: Once | INTRAMUSCULAR | Status: AC
  Administered 2017-03-24: 25 mg via INTRAVENOUS
  Filled 2017-03-24: qty 1

## 2017-03-24 MED ORDER — SODIUM CHLORIDE 0.9 % IV SOLN
510.0000 mg | Freq: Once | INTRAVENOUS | Status: AC
  Administered 2017-03-24: 510 mg via INTRAVENOUS
  Filled 2017-03-24: qty 17

## 2017-03-24 MED ORDER — SODIUM CHLORIDE 0.9 % IV SOLN
Freq: Once | INTRAVENOUS | Status: AC
  Administered 2017-03-24: 13:00:00 via INTRAVENOUS
  Filled 2017-03-24: qty 1000

## 2017-03-24 MED ORDER — CYANOCOBALAMIN 1000 MCG/ML IJ SOLN: 1000.0000 ug | INTRAMUSCULAR | 11 refills | Status: DC

## 2017-03-24 NOTE — Progress Notes (Signed)
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  female  With above history of gastric bypass- and consequent malabsorption of iron B12 is here for follow-up.  Patient complains of worsening fatigue over the last 3-4 weeks.  She also noted to have cramping in the legs. Last received IV iron approximately 6 months ago. Patient is getting B12 injections at home/pulse weekly.  Needs a prescription for B12 IM.  ROS: A complete 10 point review of system is done which is negative except mentioned above in history of present illness  MEDICAL HISTORY:  Past Medical History:  Diagnosis Date  . Anemia   . Anxiety   . Depression   . Hypertension   . Migraine    In past    SURGICAL HISTORY: Past Surgical History:  Procedure Laterality Date  . CHOLECYSTECTOMY    . DILATION AND CURETTAGE OF UTERUS  2003   2 Miscarriages  . DILITATION & CURRETTAGE/HYSTROSCOPY WITH NOVASURE ABLATION N/A 12/16/2015   Procedure: DILATATION & CURETTAGE/HYSTEROSCOPY WITH NOVASURE ABLATION;  Surgeon: Brayton Mars, MD;  Location: [facility];  Service: Gynecology;  Laterality: N/A;  . GALLBLADDER SURGERY  2003  . GASTRIC BYPASS  2004  . HERNIA REPAIR  2005    SOCIAL HISTORY: Social History   Socioeconomic History  . Marital status: Divorced    Spouse name: Not on file  . Number of children: Not on file  . Years of education: Not on file  . Highest education level: Not on file  Social Needs  . Financial resource strain: Not on file  . Food insecurity - worry: Not on file  . Food insecurity - inability: Not on file  . Transportation needs - medical: Not on file  . Transportation needs - non-medical: Not on file  Occupational  History  . Not on file  Tobacco Use  . Smoking status: Never Smoker  . Smokeless tobacco: Never Used  Substance and Sexual Activity  . Alcohol use: No  . Drug use: No  . Sexual activity: Yes    Comment: vasectomy  Other Topics Concern  . Not on file  Social History Narrative  . Not on file    FAMILY HISTORY: Family History  Adopted: Yes  Family history unknown: Yes    ALLERGIES:  has No Known Allergies.  MEDICATIONS:  Current Outpatient Medications  Medication Sig Dispense Refill  . amLODipine (NORVASC) 10 MG tablet Take 1 tablet (10 mg total) by mouth daily. 90 tablet 3  . baclofen (LIORESAL) 10 MG tablet Take 1 tablet (10 mg total) by mouth 3 (three) times daily. 30 each 0  . butalbital-acetaminophen-caffeine (FIORICET, ESGIC) 50-325-40 MG tablet Take 1-2 tablets by mouth every 6 (six) hours as needed for headache. 20 tablet 0  . cyanocobalamin (,VITAMIN B-12,) 1000 MCG/ML injection Inject 1 mL (1,000 mcg total) into the muscle every 30 (thirty) days. 1 mL 11  . ipratropium (ATROVENT) 0.06 % nasal spray Place 2 sprays into both nostrils 4 (four) times daily. For up to 5-7 days then stop. 15 mL 0  . Magnesium 250 MG TABS Take 1 tablet (250 mg total) by mouth at bedtime. 30 tablet 11  . SUMAtriptan (  IMITREX) 100 MG tablet Take 1 tablet (100 mg total) by mouth once as needed for migraine. May repeat one dose in 2 hours if headache persists, for max dose 24 hour 12 tablet 5  . Vitamin D, Ergocalciferol, (DRISDOL) 50000 units CAPS capsule Take 1 capsule (50,000 Units total) by mouth every 7 (seven) days. 12 capsule 0   No current facility-administered medications for this visit.       Marland Kitchen  PHYSICAL EXAMINATION:   Vitals:   03/24/17 1128  BP: 133/82  Pulse: (!) 50  Resp: 16  Temp: 98.1 F (36.7 C)   Filed Weights   03/24/17 1128  Weight: 181 lb (82.1 kg)    GENERAL: Well-nourished well-developed; Alert, no distress and comfortable.  She is alone. EYES: no pallor  or icterus OROPHARYNX: no thrush or ulceration; good dentition  NECK: supple, no masses felt LYMPH:  no palpable lymphadenopathy in the cervical, axillary or inguinal regions LUNGS: clear to auscultation and  No wheeze or crackles HEART/CVS: regular rate & rhythm and no murmurs; No lower extremity edema ABDOMEN: abdomen soft, non-tender and normal bowel sounds Musculoskeletal:no cyanosis of digits and no clubbing  PSYCH: alert & oriented x 3 with fluent speech NEURO: no focal motor/sensory deficits SKIN:  no rashes or significant lesions  LABORATORY DATA:  I have reviewed the data as listed Lab Results  Component Value Date   WBC 4.1 03/22/2017   HGB 14.7 03/22/2017   HCT 43.4 03/22/2017   MCV 91.6 03/22/2017   PLT 186 03/22/2017   Recent Labs    08/25/16 1251 03/22/17 1534  NA 135 136  K 3.9 4.0  CL 106 106  CO2 22 21*  GLUCOSE 82 92  BUN 13 15  CREATININE 0.55 0.57  CALCIUM 8.8* 8.8*  GFRNONAA >60 >60  GFRAA >60 >60  PROT 7.5 7.6  ALBUMIN 4.2 4.3  AST 18 21  ALT 13* 15  ALKPHOS 76 65  BILITOT 0.4 0.5      ASSESSMENT & PLAN:   Iron malabsorption # Iron deficiency-  ; sat 10%;   Recommend Feraheme every 3 months with Benadryl premedication.    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 Will give script today for B12 injections.  # vitD 1000/day.   #  Patient follow-up with me in 6 months with IV Feraheme/ labsmonths .     Cammie Sickle, MD 03/24/2017 2:17 PM

## 2017-03-24 NOTE — Assessment & Plan Note (Addendum)
#   Iron deficiency; hemoglobin is normal.  But iron saturation-low at 10%;   Recommend Feraheme every 6 months with Benadryl premedication.    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 Will give script today for B12 injections.  # Recommend vitD 1000/day.   #  Patient follow-up with me in 6 months with IV Feraheme/ labs prior.

## 2017-04-21 ENCOUNTER — Ambulatory Visit (INDEPENDENT_AMBULATORY_CARE_PROVIDER_SITE_OTHER): Payer: Managed Care, Other (non HMO) | Admitting: Obstetrics and Gynecology

## 2017-04-21 ENCOUNTER — Encounter: Payer: Self-pay | Admitting: Obstetrics and Gynecology

## 2017-04-21 VITALS — BP 175/128 | HR 120 | Ht 63.0 in | Wt 174.9 lb

## 2017-04-21 DIAGNOSIS — R87612 Low grade squamous intraepithelial lesion on cytologic smear of cervix (LGSIL): Secondary | ICD-10-CM

## 2017-04-21 DIAGNOSIS — R87810 Cervical high risk human papillomavirus (HPV) DNA test positive: Secondary | ICD-10-CM

## 2017-04-21 NOTE — Progress Notes (Signed)
Chief complaint: 1.  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  Alert and oriented. Abdomen: Soft, nontender without organomegaly Pelvic exam: External genitalia-normal BUS-normal Vagina-no discharge; normal-appearing mucosa Cervix-parous; no lesions or discharge Bimanual-deferred  ASSESSMENT: 1.  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  Colposcopy evaluation 6 months ago negative for dysplasia  PLAN: 1.  Pap smear 2.  Return as scheduled for annual exam 3.  Repeat Pap smear in 1 year  Brayton Mars, MD  Note: This dictation was prepared with Dragon dictation along with smaller phrase technology. Any transcriptional errors that result from this process are unintentional.

## 2017-04-21 NOTE — Patient Instructions (Signed)
1.  Return in 1 year for Pap smear

## 2017-04-23 LAB — IGP, COBASHPV16/18
HPV 16: POSITIVE — AB
HPV 18: NEGATIVE
HPV OTHER HR TYPES: POSITIVE — AB
PAP Smear Comment: 0

## 2017-04-26 ENCOUNTER — Ambulatory Visit: Payer: Managed Care, Other (non HMO) | Admitting: Gastroenterology

## 2017-04-26 ENCOUNTER — Encounter: Payer: Self-pay | Admitting: Obstetrics and Gynecology

## 2017-04-26 ENCOUNTER — Telehealth: Payer: Self-pay

## 2017-05-03 ENCOUNTER — Other Ambulatory Visit: Payer: Self-pay

## 2017-05-03 ENCOUNTER — Ambulatory Visit: Payer: Managed Care, Other (non HMO) | Admitting: Gastroenterology

## 2017-05-03 ENCOUNTER — Encounter: Payer: Self-pay | Admitting: Gastroenterology

## 2017-05-03 VITALS — BP 122/82 | HR 67 | Temp 97.7°F | Ht 64.5 in | Wt 181.4 lb

## 2017-05-03 DIAGNOSIS — K5900 Constipation, unspecified: Secondary | ICD-10-CM | POA: Diagnosis not present

## 2017-05-03 NOTE — Progress Notes (Signed)
Cephas Darby, MD [address]  [place]  [place], [place] [address]  Main: [phone]  Fax: [phone]    Gastroenterology Consultation  Referring Provider:     [name] * Primary Care Physician:  Olin Hauser, DO Primary Gastroenterologist:  Dr. Cephas Darby Reason for Consultation:     Altered bowel habits        HPI:   Crystal Levine is a 52 y.o. female referred by Dr. Parks Ranger, Devonne Doughty, DO  for consultation & management of altered bowel habits. She has history of Roux-en-Y gastric bypass about 14 years ago presents with 2 month history of constipation. Prior to today's, she was predominantly having nonbloody diarrhea. She has history of gallbladder removed and tends to have looser bowel movements. However, for the last 2 months chest been experiencing severe constipation, having only one bowel movement per week, using stool softeners over-the-counter. She does report intermittent flareup of hemorrhoids but not associated with rectal bleeding. She does report lower abdominal burning pain, cramps, regurgitation. She denies weight loss, nausea, vomiting, abdominal distention/bloating. She has B12 and iron deficiency and has been receiving iron infusions every 6 months. Her most recent CBC, ferritin and B12 have been unremarkable.  NSAIDs: None  Antiplts/Anticoagulants/Anti thrombotics: None  GI Procedures: Had a colonoscopy approximately 7 or 8 years ago and she was told that she had polyps  She denies family history of colon cancer or other GI malignancies  Past Medical History:  Diagnosis Date  . Anemia   . Anxiety   . Depression   . Hypertension   . Migraine    In past    Past Surgical History:  Procedure Laterality Date  . CHOLECYSTECTOMY    . DILATION AND CURETTAGE OF UTERUS  2003   2 Miscarriages  . DILITATION & CURRETTAGE/HYSTROSCOPY WITH NOVASURE ABLATION N/A 12/16/2015   Procedure: DILATATION &  CURETTAGE/HYSTEROSCOPY WITH NOVASURE ABLATION;  Surgeon: Brayton Mars, MD;  Location: [facility] ORS;  Service: Gynecology;  Laterality: N/A;  . GALLBLADDER SURGERY  2003  . GASTRIC BYPASS  2004  . HERNIA REPAIR  2005    Current Outpatient Medications:  .  amLODipine (NORVASC) 10 MG tablet, Take 1 tablet (10 mg total) by mouth daily., Disp: 90 tablet, Rfl: 3 .  baclofen (LIORESAL) 10 MG tablet, Take 1 tablet (10 mg total) by mouth 3 (three) times daily., Disp: 30 each, Rfl: 0 .  butalbital-acetaminophen-caffeine (FIORICET, ESGIC) 50-325-40 MG tablet, Take 1-2 tablets by mouth every 6 (six) hours as needed for headache., Disp: 20 tablet, Rfl: 0 .  cyanocobalamin (,VITAMIN B-12,) 1000 MCG/ML injection, Inject 1 mL (1,000 mcg total) into the muscle every 30 (thirty) days., Disp: 1 mL, Rfl: 11 .  Vitamin D, Ergocalciferol, (DRISDOL) 50000 units CAPS capsule, Take 1 capsule (50,000 Units total) by mouth every 7 (seven) days., Disp: 12 capsule, Rfl: 0   Family History  Adopted: Yes  Family history unknown: Yes     Social History   Tobacco Use  . Smoking status: Never Smoker  . Smokeless tobacco: Never Used  Substance Use Topics  . Alcohol use: No  . Drug use: No    Allergies as of 05/03/2017  . (No Known Allergies)    Review of Systems:    All systems reviewed and negative except where noted in HPI.   Physical Exam:  BP 122/82   Pulse 67   Temp 97.7 F (36.5 C) (Oral)   Ht 5' 4.5" (1.638  m)   Wt 181 lb 6.4 oz (82.3 kg)   BMI 30.66 kg/m  No LMP recorded. Patient has had an ablation.  General:   Alert,  Well-developed, well-nourished, pleasant and cooperative in NAD Head:  Normocephalic and atraumatic. Eyes:  Sclera clear, no icterus.   Conjunctiva pink. Ears:  Normal auditory acuity. Nose:  No deformity, discharge, or lesions. Mouth:  No deformity or lesions,oropharynx pink & moist. Neck:  Supple; no masses or thyromegaly. Lungs:  Respirations even and unlabored.   Clear throughout to auscultation.   No wheezes, crackles, or rhonchi. No acute distress. Heart:  Regular rate and rhythm; no murmurs, clicks, rubs, or gallops. Abdomen:  Normal bowel sounds. Soft, non-tender and non-distended without masses, hepatosplenomegaly or hernias noted.  No guarding or rebound tenderness.   Rectal: Not performed Msk:  Symmetrical without gross deformities. Good, equal movement & strength bilaterally. Pulses:  Normal pulses noted. Extremities:  No clubbing or edema.  No cyanosis. Neurologic:  Alert and oriented x3;  grossly normal neurologically. Skin:  Intact without significant lesions or rashes. No jaundice. Lymph Nodes:  No significant cervical adenopathy. Psych:  Alert and cooperative. Normal mood and affect.  Imaging Studies: No abdominal imaging  Assessment and Plan:   Crystal Levine is a 52 y.o. female with history of Roux-en-Y gastric bypass, iron deficiency and B12 deficiency presents with 2 month history of constipation.  - Recommend trial of plecanatide - Include fiber supplements, handouts given - Recommend colonoscopy given history of altered bowel habits - If colonoscopy negative, she is interested in outpatient hemorrhoid ligation treatment  I have discussed alternative options, risks & benefits,  which include, but are not limited to, bleeding, infection, perforation,respiratory complication & drug reaction.  The patient agrees with this plan & written consent will be obtained.    Follow up in 4 weeks   Cephas Darby, MD

## 2017-05-12 ENCOUNTER — Ambulatory Visit (INDEPENDENT_AMBULATORY_CARE_PROVIDER_SITE_OTHER): Payer: Managed Care, Other (non HMO) | Admitting: Obstetrics and Gynecology

## 2017-05-12 ENCOUNTER — Encounter: Payer: Self-pay | Admitting: Obstetrics and Gynecology

## 2017-05-12 VITALS — BP 116/76 | HR 73 | Ht 63.5 in | Wt 182.7 lb

## 2017-05-12 DIAGNOSIS — R87810 Cervical high risk human papillomavirus (HPV) DNA test positive: Secondary | ICD-10-CM

## 2017-05-12 NOTE — Progress Notes (Signed)
Chief complaint: 1.  Colposcopy 2.  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 Cervix biopsy 9:00-cc with koilocytosis Cervix biopsy 12:00-cc with koilocytosis 04/21/2017 Pap smear-negative/positive high risk HPV/positive HPV 16/negative HPV 18 05/12/2017 colposcopy-SCJ not visualized; multiple nabothian cysts noted at 3 and 4:00; possible abnormal vessels at 6:00 9:00 3:00 and 12:00 (similar appearance to last colposcopy in March 2018) Biopsies:  Cervix biopsy 6:00  Cervix biopsy 9:00  Cervix biopsy 3:00  Cervix biopsy 12:00   OBJECTIVE: BP 116/76   Pulse 73   Ht 5' 3.5" (1.613 m)   Wt 182 lb 11.2 oz (82.9 kg)   BMI 31.86 kg/m  Pleasant well-appearing female in no acute distress.  Alert and oriented. Pelvic exam: External genitalia-normal BUS-normal Vagina-normal mucosa; no lesions Cervix-parous; multiple nabothian cysts seen  PROCEDURE: Colposcopy of cervix, upper adjacent vagina with biopsies Indications: Positive high risk HPV/HPV 16+/HPV 18- Findings: SCJ not visualized; possible abnormal vessels at 6:00, 9:00 3:00 and 12:00 Biopsies:  Cervix biopsy 6:00  Cervix biopsy 9:00  Cervix biopsy 3:00  Cervix biopsy 12:00 Description: Verbal consent is obtained.  Patient is placed in the dorsolithotomy position.  A Graves speculum is inserted to assess visualization of the vagina and cervix.  The vagina and cervix is painted with Fox swabs soaked in acetic acid.  Colposcopy is performed with the above-noted findings being identified.   Cervical biopsies are taken at 4 quadrants as noted.  Monsel solution is applied for hemostasis.  Bleeding is minimal.  Complications are none.  Procedure was well-tolerated.  ASSESSMENT: 1.  History of LGSIL Pap smear 2.  Last Pap smear (04/21/2017) notable for normal cells but positive high risk HPV/HPV 16+/HPV 18- 3.  Inadequate colposcopy today with evidence of possible abnormal vessels in 4 quadrants  PLAN: 1.  Colposcopy with biopsies at 6:00, 9:00, 3:00, and 12:00 2.  Return in 6 months for repeat colposcopy  Brayton Mars, MD  Note: This dictation was prepared with Dragon dictation along with smaller phrase technology. Any transcriptional errors that result from this process are unintentional.

## 2017-05-12 NOTE — Patient Instructions (Signed)
1.1.  Colposcopy with biopsies is completed today 2.  Return in 6 months for repeat colposcopy   Cervical Biopsy, Care After Refer to this sheet in the next few weeks. These instructions provide you with information about caring for yourself after your procedure. Your health care provider may also give you more specific instructions. Your treatment has been planned according to current medical practices, but problems sometimes occur. Call your health care provider if you have any problems or questions after your procedure. What can I expect after the procedure? After the procedure, it is common to have:  Cramping or mild pain for a few days.  Slight bleeding from the vagina for a few days.  Dark-colored vaginal discharge for a few days.  Follow these instructions at home:  Take over-the-counter and prescription medicines only as told by your health care provider.  Return to your normal activities as told by your health care provider. Ask your health care provider what activities are safe for you.  Use a sanitary napkin until bleeding and discharge stop.  Do not use tampons until your health care provider approves.  Do not douche until your health care provider approves.  Do not have sex until your health care provider approves.  Keep all follow-up visits as told by your health care provider. This is important. Contact a health care provider if:  You have a fever or chills.  You have bad-smelling vaginal discharge.  You have itching or irritation around the vagina.  You have lower abdominal pain. Get help right away if:  You develop heavy vaginal bleeding that soaks more than one sanitary pad an hour.  You faint.  You have very bad lower abdominal pain. This information is not intended to replace advice given to you by your health care provider. Make sure you discuss any questions you have with your health care provider. Document Released: 12/26/2014 Document Revised:  09/12/2015 Document Reviewed: 08/22/2014 Elsevier Interactive Patient Education  2018 [employer].

## 2017-05-14 LAB — PATHOLOGY

## 2017-05-24 ENCOUNTER — Telehealth: Payer: Self-pay | Admitting: Gastroenterology

## 2017-05-24 NOTE — Telephone Encounter (Signed)
LVM for patient to call and reschedule. °

## 2017-05-26 ENCOUNTER — Encounter: Payer: Self-pay | Admitting: Gastroenterology

## 2017-05-26 ENCOUNTER — Telehealth: Payer: Self-pay | Admitting: Gastroenterology

## 2017-05-26 NOTE — Telephone Encounter (Signed)
LVM for patient to call and reschedule. Letter sent °

## 2017-05-27 ENCOUNTER — Encounter
Admission: RE | Disposition: A | Discharge status: Home / Self Care | Payer: Self-pay | Source: Ambulatory Visit | Attending: Gastroenterology

## 2017-05-27 ENCOUNTER — Ambulatory Visit: Payer: Managed Care, Other (non HMO) | Admitting: Anesthesiology

## 2017-05-27 ENCOUNTER — Ambulatory Visit
Admission: RE | Admit: 2017-05-27 | Discharge: 2017-05-27 | Disposition: A | Discharge status: Home / Self Care | Payer: Managed Care, Other (non HMO) | Source: Ambulatory Visit | Attending: Gastroenterology | Admitting: Gastroenterology

## 2017-05-27 ENCOUNTER — Encounter: Payer: Self-pay | Admitting: *Deleted

## 2017-05-27 DIAGNOSIS — Z9049 Acquired absence of other specified parts of digestive tract: Secondary | ICD-10-CM | POA: Diagnosis not present

## 2017-05-27 DIAGNOSIS — I10 Essential (primary) hypertension: Secondary | ICD-10-CM | POA: Insufficient documentation

## 2017-05-27 DIAGNOSIS — K5901 Slow transit constipation: Secondary | ICD-10-CM | POA: Insufficient documentation

## 2017-05-27 DIAGNOSIS — K648 Other hemorrhoids: Secondary | ICD-10-CM | POA: Diagnosis not present

## 2017-05-27 DIAGNOSIS — Z79899 Other long term (current) drug therapy: Secondary | ICD-10-CM | POA: Insufficient documentation

## 2017-05-27 DIAGNOSIS — K625 Hemorrhage of anus and rectum: Secondary | ICD-10-CM | POA: Diagnosis not present

## 2017-05-27 DIAGNOSIS — Z9884 Bariatric surgery status: Secondary | ICD-10-CM | POA: Diagnosis not present

## 2017-05-27 DIAGNOSIS — K5900 Constipation, unspecified: Secondary | ICD-10-CM

## 2017-05-27 HISTORY — PX: COLONOSCOPY WITH PROPOFOL: SHX5780

## 2017-05-27 SURGERY — COLONOSCOPY WITH PROPOFOL
Anesthesia: General

## 2017-05-27 MED ORDER — PROPOFOL 500 MG/50ML IV EMUL
INTRAVENOUS | Status: DC | PRN
  Administered 2017-05-27: 160 ug/kg/min via INTRAVENOUS

## 2017-05-27 MED ORDER — SODIUM CHLORIDE 0.9 % IV SOLN
INTRAVENOUS | Status: DC
  Administered 2017-05-27 (×2): via INTRAVENOUS

## 2017-05-27 MED ORDER — PROPOFOL 500 MG/50ML IV EMUL
INTRAVENOUS | Status: AC
  Filled 2017-05-27: qty 50

## 2017-05-27 MED ORDER — LIDOCAINE 2% (20 MG/ML) 5 ML SYRINGE
INTRAMUSCULAR | Status: DC | PRN
  Administered 2017-05-27: 30 mg via INTRAVENOUS

## 2017-05-27 MED ORDER — PROPOFOL 10 MG/ML IV BOLUS
INTRAVENOUS | Status: DC | PRN
  Administered 2017-05-27: 100 mg via INTRAVENOUS

## 2017-05-27 MED ORDER — FENTANYL CITRATE (PF) 100 MCG/2ML IJ SOLN
INTRAMUSCULAR | Status: AC
  Filled 2017-05-27: qty 2

## 2017-05-27 MED ORDER — PROPOFOL 10 MG/ML IV BOLUS
INTRAVENOUS | Status: AC
  Filled 2017-05-27: qty 20

## 2017-05-27 MED ORDER — FENTANYL CITRATE (PF) 100 MCG/2ML IJ SOLN
INTRAMUSCULAR | Status: DC | PRN
  Administered 2017-05-27 (×2): 50 ug via INTRAVENOUS

## 2017-05-27 NOTE — H&P (Signed)
Cephas Darby, MD [address]  [place]  [place], [place] [address]  Main: [phone]  Fax: [phone] Pager: [phone]  Primary Care Physician:  Olin Hauser, DO Primary Gastroenterologist:  Dr. Cephas Darby  Pre-Procedure History & Physical: HPI:  CARLYANN PLACIDE is a 52 y.o. female is here for an colonoscopy.   Past Medical History:  Diagnosis Date  . Anemia   . Anxiety   . Depression   . Hypertension   . Migraine    In past    Past Surgical History:  Procedure Laterality Date  . CHOLECYSTECTOMY    . DILATION AND CURETTAGE OF UTERUS  2003   2 Miscarriages  . DILITATION & CURRETTAGE/HYSTROSCOPY WITH NOVASURE ABLATION N/A 12/16/2015   Procedure: DILATATION & CURETTAGE/HYSTEROSCOPY WITH NOVASURE ABLATION;  Surgeon: Brayton Mars, MD;  Location: [facility] ORS;  Service: Gynecology;  Laterality: N/A;  . GALLBLADDER SURGERY  2003  . GASTRIC BYPASS  2004  . HERNIA REPAIR  2005    Prior to Admission medications   Medication Sig Start Date End Date Taking? Authorizing Provider  amLODipine (NORVASC) 10 MG tablet Take 1 tablet (10 mg total) by mouth daily. 08/10/16  Yes Karamalegos, Devonne Doughty, DO  baclofen (LIORESAL) 10 MG tablet Take 1 tablet (10 mg total) by mouth 3 (three) times daily. 03/10/16  Yes Fisher, Linden Dolin, PA-C  butalbital-acetaminophen-caffeine (FIORICET, ESGIC) [phone] MG tablet Take 1-2 tablets by mouth every 6 (six) hours as needed for headache. 07/10/16 07/10/17 Yes Lavonia Drafts, MD  cyanocobalamin (,VITAMIN B-12,) 1000 MCG/ML injection Inject 1 mL (1,000 mcg total) into the muscle every 30 (thirty) days. 03/24/17  Yes Cammie Sickle, MD  Vitamin D, Ergocalciferol, (DRISDOL) 50000 units CAPS capsule Take 1 capsule (50,000 Units total) by mouth every 7 (seven) days. Patient not taking: Reported on 05/27/2017 01/29/16   Heath Lark, MD    Allergies as of 05/03/2017  . (No Known Allergies)    Family History  Adopted: Yes   Family history unknown: Yes    Social History   Socioeconomic History  . Marital status: Divorced    Spouse name: Not on file  . Number of children: Not on file  . Years of education: Not on file  . Highest education level: Not on file  Social Needs  . Financial resource strain: Not on file  . Food insecurity - worry: Not on file  . Food insecurity - inability: Not on file  . Transportation needs - medical: Not on file  . Transportation needs - non-medical: Not on file  Occupational History  . Not on file  Tobacco Use  . Smoking status: Never Smoker  . Smokeless tobacco: Never Used  Substance and Sexual Activity  . Alcohol use: No  . Drug use: No  . Sexual activity: Yes    Comment: vasectomy  Other Topics Concern  . Not on file  Social History Narrative  . Not on file    Review of Systems: See HPI, otherwise negative ROS  Physical Exam: BP 139/90   Pulse (!) 56   Temp (!) 96.7 F (35.9 C) (Tympanic)   Resp 20   Ht 5\' 4"  (1.626 m)   Wt 174 lb (78.9 kg)   SpO2 100%   BMI 29.87 kg/m  General:   Alert,  pleasant and cooperative in NAD Head:  Normocephalic and atraumatic. Neck:  Supple; no masses or thyromegaly. Lungs:  Clear throughout to auscultation.    Heart:  Regular  rate and rhythm. Abdomen:  Soft, nontender and nondistended. Normal bowel sounds, without guarding, and without rebound.   Neurologic:  Alert and  oriented x4;  grossly normal neurologically.  Impression/Plan: JULE WHITSEL is here for an colonoscopy to be performed for altered bowel habits  Risks, benefits, limitations, and alternatives regarding  colonoscopy have been reviewed with the patient.  Questions have been answered.  All parties agreeable.   Sherri Sear, MD  05/27/2017, 8:53 AM

## 2017-05-27 NOTE — Anesthesia Post-op Follow-up Note (Signed)
Anesthesia QCDR form completed.        

## 2017-05-27 NOTE — Op Note (Signed)
[facility] Patient Name: Crystal Levine Procedure Date: 05/27/2017 9:41 AM MRN: [id number] Account #: [id number] Date of Birth: [birth year] Admit Type: Outpatient Age: 52 Room: [facility] ENDO ROOM 3 Gender: Female Note Status: Finalized Procedure:            Colonoscopy Indications:          Slow transit constipation Providers:            Lin Landsman MD, MD Referring MD:         Olin Hauser (Referring MD) Medicines:            Monitored Anesthesia Care Complications:        No immediate complications. Estimated blood loss: None. Procedure:            Pre-Anesthesia Assessment:                       - Prior to the procedure, a History and Physical was                        performed, and patient medications and allergies were                        reviewed. The patient is competent. The risks and                        benefits of the procedure and the sedation options and                        risks were discussed with the patient. All questions                        were answered and informed consent was obtained.                        Patient identification and proposed procedure were                        verified by the physician, the nurse, the                        anesthesiologist, the anesthetist and the technician in                        the pre-procedure area in the procedure room in the                        endoscopy suite. Mental Status Examination: alert and                        oriented. Airway Examination: normal oropharyngeal                        airway and neck mobility. Respiratory Examination:                        clear to auscultation. CV Examination: normal.                        Prophylactic Antibiotics: The patient does not require  prophylactic antibiotics. Prior Anticoagulants: The                        patient has taken no previous anticoagulant or     antiplatelet agents. ASA Grade Assessment: II - A                        patient with mild systemic disease. After reviewing the                        risks and benefits, the patient was deemed in                        satisfactory condition to undergo the procedure. The                        anesthesia plan was to use monitored anesthesia care                        (MAC). Immediately prior to administration of                        medications, the patient was re-assessed for adequacy                        to receive sedatives. The heart rate, respiratory rate,                        oxygen saturations, blood pressure, adequacy of                        pulmonary ventilation, and response to care were                        monitored throughout the procedure. The physical status                        of the patient was re-assessed after the procedure.                       After obtaining informed consent, the colonoscope was                        passed under direct vision. Throughout the procedure,                        the patient's blood pressure, pulse, and oxygen                        saturations were monitored continuously. The                        Colonoscope was introduced through the anus and                        advanced to the the terminal ileum. The colonoscopy was                        extremely difficult due to significant looping and the  patient's body habitus. Successful completion of the                        procedure was aided by changing the patient to a prone                        position, withdrawing and reinserting the scope and                        applying abdominal pressure. The patient tolerated the                        procedure well. The quality of the bowel preparation                        was evaluated using the BBPS [facility] Bowel Preparation                        Scale) with scores of: Right Colon = 3,  Transverse                        Colon = 3 and Left Colon = 3 (entire mucosa seen well                        with no residual staining, small fragments of stool or                        opaque liquid). The total BBPS score equals 9. Findings:      The perianal and digital rectal examinations were normal. Pertinent       negatives include normal sphincter tone and no palpable rectal lesions.      The colon (entire examined portion) revealed significantly excessive       looping. Advancing the scope required changing the patient to a prone       position and using manual pressure.      Non-bleeding internal hemorrhoids were found during retroflexion. The       hemorrhoids were large.      The entire examined colon appeared normal. Impression:           - There was significant looping of the colon.                       - Non-bleeding internal hemorrhoids.                       - The entire examined colon is normal.                       - No specimens collected. Recommendation:       - Discharge patient to home.                       - Resume previous diet today.                       - Continue present medications.                       - Repeat colonoscopy in 10 years for surveillance.                       -  Return to my office as previously scheduled.                       - Trial of linaclotide as trulence had modest benefit                       - High fiber diet and fiber supplements Procedure Code(s):    --- Professional ---                       [phone], Colonoscopy, flexible; diagnostic, including                        collection of specimen(s) by brushing or washing, when                        performed (separate procedure) Diagnosis Code(s):    --- Professional ---                       K64.8, Other hemorrhoids                       K59.01, Slow transit constipation CPT copyright 2016 American Medical Association. All rights reserved. The codes documented in this report  are preliminary and upon coder review may  be revised to meet current compliance requirements. Dr. Ulyess Mort Lin Landsman MD, MD 05/27/2017 10:53:17 AM This report has been signed electronically. Number of Addenda: 0 Note Initiated On: 05/27/2017 9:41 AM Scope Withdrawal Time: 0 hours 7 minutes 33 seconds  Total Procedure Duration: 0 hours 37 minutes 27 seconds       [facility]

## 2017-05-27 NOTE — Anesthesia Postprocedure Evaluation (Signed)
Anesthesia Post Note  Patient: Crystal Levine  Procedure(s) Performed: COLONOSCOPY WITH PROPOFOL (N/A )  Patient location during evaluation: PACU Anesthesia Type: General Level of consciousness: awake and alert and oriented Pain management: pain level controlled Vital Signs Assessment: post-procedure vital signs reviewed and stable Respiratory status: spontaneous breathing Cardiovascular status: blood pressure returned to baseline Anesthetic complications: no     Last Vitals:  Vitals:   05/27/17 1100 05/27/17 1110  BP: 128/85 (!) 137/91  Pulse: (!) 58 (!) 51  Resp: 12 (!) 21  Temp:    SpO2: 100% 100%    Last Pain:  Vitals:   05/27/17 1040  TempSrc: Tympanic  PainSc: Asleep                 [name]

## 2017-05-27 NOTE — Anesthesia Preprocedure Evaluation (Signed)
Anesthesia Evaluation  Patient identified by MRN, date of birth, ID band Patient awake    Reviewed: Allergy & Precautions, NPO status , Patient's Chart, lab work & pertinent test results, reviewed documented beta blocker date and time   Airway Mallampati: II  TM Distance: >3 FB     Dental  (+) Chipped   Pulmonary neg pulmonary ROS,           Cardiovascular hypertension,      Neuro/Psych  Headaches, PSYCHIATRIC DISORDERS Anxiety Depression    GI/Hepatic negative GI ROS, Neg liver ROS,   Endo/Other  Hyperthyroidism   Renal/GU negative Renal ROS     Musculoskeletal   Abdominal   Peds  Hematology  (+) anemia ,   Anesthesia Other Findings Gastric bypass.  Reproductive/Obstetrics                             Anesthesia Physical  Anesthesia Plan  ASA: II  Anesthesia Plan: General   Post-op Pain Management:    Induction: Intravenous  PONV Risk Score and Plan:   Airway Management Planned: Nasal Cannula  Additional Equipment:   Intra-op Plan:   Post-operative Plan:   Informed Consent: I have reviewed the patients History and Physical, chart, labs and discussed the procedure including the risks, benefits and alternatives for the proposed anesthesia with the patient or authorized representative who has indicated his/her understanding and acceptance.   Dental advisory given  Plan Discussed with: CRNA and Surgeon  Anesthesia Plan Comments:         Anesthesia Quick Evaluation

## 2017-05-27 NOTE — Transfer of Care (Signed)
Immediate Anesthesia Transfer of Care Note  Patient: Crystal Levine  Procedure(s) Performed: COLONOSCOPY WITH PROPOFOL (N/A )  Patient Location: PACU and Endoscopy Unit  Anesthesia Type:General  Level of Consciousness: sedated  Airway & Oxygen Therapy: Patient Spontanous Breathing and Patient connected to nasal cannula oxygen  Post-op Assessment: Report given to RN and Post -op Vital signs reviewed and stable  Post vital signs: Reviewed and stable  Last Vitals:  Vitals:   05/27/17 0832 05/27/17 1040  BP: 139/90 (!) (P) 93/57  Pulse: (!) 56   Resp: 20   Temp: (!) 35.9 C   SpO2: 100%     Last Pain:  Vitals:   05/27/17 1040  TempSrc: (P) Tympanic         Complications: No apparent anesthesia complications

## 2017-06-07 ENCOUNTER — Other Ambulatory Visit: Payer: Self-pay | Admitting: Family Medicine

## 2017-06-07 DIAGNOSIS — Z20828 Contact with and (suspected) exposure to other viral communicable diseases: Secondary | ICD-10-CM

## 2017-06-07 MED ORDER — OSELTAMIVIR PHOSPHATE 75 MG PO CAPS: 75.0000 mg | ORAL_CAPSULE | Freq: Every day | ORAL | 0 refills | Status: DC

## 2017-06-07 NOTE — Progress Notes (Signed)
Patient's daughter sick with confirmed Influenza A in our office today 06/07/17. Sent rx Tamiflu for preventative dose 75mg  capsule daily for 10 days, advised to increase dose to 1 pill TWICE daily if develop symptoms.  [name], [facility] 06/07/2017, 4:11 PM

## 2017-06-18 ENCOUNTER — Encounter: Payer: Self-pay | Admitting: Family Medicine

## 2017-06-18 ENCOUNTER — Ambulatory Visit: Payer: Managed Care, Other (non HMO) | Admitting: Family Medicine

## 2017-06-18 VITALS — BP 121/78 | HR 63 | Temp 98.6°F | Resp 16 | Ht 64.5 in | Wt 179.0 lb

## 2017-06-18 DIAGNOSIS — J209 Acute bronchitis, unspecified: Secondary | ICD-10-CM | POA: Diagnosis not present

## 2017-06-18 MED ORDER — AZITHROMYCIN 250 MG PO TABS: ORAL_TABLET | ORAL | 0 refills | Status: DC

## 2017-06-18 MED ORDER — BENZONATATE 100 MG PO CAPS: 100.0000 mg | ORAL_CAPSULE | Freq: Three times a day (TID) | ORAL | 0 refills | Status: DC | PRN

## 2017-06-18 NOTE — Patient Instructions (Addendum)
Thank you for coming to the office today.  1. It sounds like you had an Upper Respiratory Virus that has settled into a Bronchitis, lower respiratory tract infection. I don't have concerns for pneumonia today, and think that this should gradually improve. Once you are feeling better, the cough may take a few weeks to fully resolve. I do hear coarse breath sounds, this may be due to the virus, also could be related to smoking.   start Azithromycin Z pak (antibiotic) 2 tabs day 1, then 1 tab x 4 days, complete entire course even if improved  Use Atrovent that you have at home as needed  Start Tessalon Perls take 1 capsule up to 3 times a day as needed for cough  Continue NyQuil  - Start OTC Mucinex for 1 week or less, to help clear the mucus  - Use nasal saline (Simply Saline or Ocean Spray) to flush nasal congestion multiple times a day, may help cough - Drink plenty of fluids to improve congestion  If your symptoms seem to worsen instead of improve over next several days, including significant fever / chills, worsening shortness of breath, worsening wheezing, or nausea / vomiting and can't take medicines - return sooner or go to hospital Emergency Department for more immediate treatment.  Please schedule a Follow-up Appointment to: Return if symptoms worsen or fail to improve, for bronchitis .    If you have any other questions or concerns, please feel free to call the office or send a message through [place]. You may also schedule an earlier appointment if necessary.  Additionally, you may be receiving a survey about your experience at our office within a few days to 1 week by e-mail or mail. We value your feedback.  [name], DO [place]

## 2017-06-18 NOTE — Progress Notes (Signed)
Subjective:    Patient ID: Crystal Levine, female    DOB: [birth year], 52 y.o.   MRN: [id number]  Crystal Levine is a 52 y.o. female presenting on 06/18/2017 for Nasal Congestion (cough, chest congestion, HA, ear pain, sore throat onset 5 days)  Patient presents for a same day appointment.  HPI   ACUTE BRONCHITIS / Exposure to Flu Recent history her daughter, [name] was treated for positive Influenza A on 2/18 with Tamiflu. Now the patient, Crystal Levine, was given prophylaxis Tamiflu on that day 2/18 but did not start until first noticed symptoms on Monday 2/25, she completed 5 days of BID Tamiflu for treatment dose, not preventative dose - Describes symptoms with initial cough was deeper but non productive, then worsening to more productive with some thicker green/yellow sputum Worsening to add some sinus bilateral pain pressure and congestion Taking NyQuil, Mucus relief (mucinex) Husband [name] without any symptoms URI only mild sore throat Admits low grade temp 50F in past but seems improved Admits headache into chest Denies body aches, muscle aches, sore throat, ear pain, diarrhea, cough nausea vomiting  Did not receive Flu Vaccine this season  Depression screen [facility] 2/9 08/24/2016 06/05/2016 07/09/2015  Decreased Interest 0 0 -  Down, Depressed, Hopeless 0 0 1  PHQ - 2 Score 0 0 1  Altered sleeping - - 3  Tired, decreased energy - - 2  Change in appetite - - 2  Feeling bad or failure about yourself  - - 1  Trouble concentrating - - 1  Moving slowly or fidgety/restless - - 2  Suicidal thoughts - - 0  PHQ-9 Score - - 12  Difficult doing work/chores - - Somewhat difficult    Social History   Tobacco Use  . Smoking status: Never Smoker  . Smokeless tobacco: Never Used  Substance Use Topics  . Alcohol use: No  . Drug use: No    Review of Systems Per HPI unless specifically indicated above     Objective:    BP 121/78   Pulse 63   Temp 98.6 F (37 C) (Oral)   Resp 16    Ht 5' 4.5" (1.638 m)   Wt 179 lb (81.2 kg)   SpO2 97%   BMI 30.25 kg/m   Wt Readings from Last 3 Encounters:  06/18/17 179 lb (81.2 kg)  05/27/17 174 lb (78.9 kg)  05/12/17 182 lb 11.2 oz (82.9 kg)    Physical Exam  Constitutional: She is oriented to person, place, and time. She appears well-developed and well-nourished. No distress.  Mildly ill and tired appearing, comfortable, cooperative  HENT:  Head: Normocephalic and atraumatic.  Mouth/Throat: Oropharynx is clear and moist.  Frontal / maxillary sinuses mild tender. Nares patent without purulence but with congestion. Bilateral TMs clear without erythema, effusion or bulging. Oropharynx clear without erythema, exudates, edema or asymmetry.  Eyes: Conjunctivae are normal. Right eye exhibits no discharge. Left eye exhibits no discharge.  Neck: Normal range of motion. Neck supple. No thyromegaly present.  Cardiovascular: Normal rate, regular rhythm, normal heart sounds and intact distal pulses.  No murmur heard. Pulmonary/Chest: Effort normal. No respiratory distress. She has no wheezes. She has no rales.  Mild coarse breath sounds diffusely without focal abnormality, no wheezing  Musculoskeletal: Normal range of motion. She exhibits no edema.  Lymphadenopathy:    She has no cervical adenopathy.  Neurological: She is alert and oriented to person, place, and time.  Skin: Skin is warm and dry. No rash  noted. She is not diaphoretic. No erythema.  Psychiatric: She has a normal mood and affect. Her behavior is normal.  Well groomed, good eye contact, normal speech and thoughts  Nursing note and vitals reviewed.  Results for orders placed or performed in visit on 05/12/17  Pathology  Result Value Ref Range   . Comment    . Comment    . Comment    . Comment    . Comment    . Comment   Pathology  Result Value Ref Range   . Comment    . Comment    . Comment    . Comment    . Comment    . Comment   Pathology  Result Value Ref  Range   . Comment    . Comment    . Comment    . Comment    . Comment    . Comment   Pathology  Result Value Ref Range   . Comment    . Comment    . Comment    . Comment    . Comment    . Comment       Assessment & Plan:   Problem List Items Addressed This Visit    None    Visit Diagnoses    Acute bronchitis, unspecified organism    -  Primary   Relevant Medications   azithromycin (ZITHROMAX Z-PAK) 250 MG tablet   benzonatate (TESSALON) 100 MG capsule      Consistent with worsening bronchitis in setting of likely viral URI (+sick contacts with Flu at home) - Symptoms not consistent with influenza at this time - S/p Tamiflu treatment dose x 5 days already limited relief - Low grade temps before but now afebrile, no focal signs of infection (not consistent with pneumonia by history or exam), no evidence sinusitis.  Plan: 1. Start Azithromycin Z-pak dosing 500mg  then 250mg  daily x 4 days - Use existing atrovent nasal spray at home - Start Tessalon Perls take 1 capsule up to 3 times a day as needed for cough - Recommend trial OTC - Mucinex, Tylenol/Ibuprofen PRN, Nasal saline, lozenges, tea with honey/lemon  Return criteria reviewed, follow-up within 1 week if not improved   Meds ordered this encounter  Medications  . azithromycin (ZITHROMAX Z-PAK) 250 MG tablet    Sig: Take 2 tabs (500mg  total) on Day 1. Take 1 tab (250mg ) daily for next 4 days.    Dispense:  6 tablet    Refill:  0  . benzonatate (TESSALON) 100 MG capsule    Sig: Take 1 capsule (100 mg total) by mouth 3 (three) times daily as needed for cough.    Dispense:  30 capsule    Refill:  0     Follow up plan: Return if symptoms worsen or fail to improve, for bronchitis .  [name], DO [facility] 06/18/2017, 1:25 PM

## 2017-06-21 ENCOUNTER — Emergency Department
Admission: EM | Admit: 2017-06-21 | Discharge: 2017-06-21 | Disposition: A | Discharge status: Home / Self Care | Payer: Managed Care, Other (non HMO) | Attending: Emergency Medicine | Admitting: Emergency Medicine

## 2017-06-21 ENCOUNTER — Encounter: Payer: Self-pay | Admitting: Family Medicine

## 2017-06-21 ENCOUNTER — Emergency Department: Payer: Managed Care, Other (non HMO)

## 2017-06-21 ENCOUNTER — Ambulatory Visit (INDEPENDENT_AMBULATORY_CARE_PROVIDER_SITE_OTHER): Payer: Managed Care, Other (non HMO) | Admitting: Family Medicine

## 2017-06-21 ENCOUNTER — Other Ambulatory Visit: Payer: Self-pay

## 2017-06-21 ENCOUNTER — Encounter: Payer: Self-pay | Admitting: Emergency Medicine

## 2017-06-21 VITALS — BP 153/97 | HR 59 | Temp 98.1°F | Resp 18 | Ht 64.5 in | Wt 176.0 lb

## 2017-06-21 DIAGNOSIS — I10 Essential (primary) hypertension: Secondary | ICD-10-CM | POA: Insufficient documentation

## 2017-06-21 DIAGNOSIS — R0789 Other chest pain: Secondary | ICD-10-CM

## 2017-06-21 DIAGNOSIS — J111 Influenza due to unidentified influenza virus with other respiratory manifestations: Secondary | ICD-10-CM | POA: Insufficient documentation

## 2017-06-21 DIAGNOSIS — R69 Illness, unspecified: Secondary | ICD-10-CM

## 2017-06-21 DIAGNOSIS — R0781 Pleurodynia: Secondary | ICD-10-CM

## 2017-06-21 DIAGNOSIS — G933 Postviral fatigue syndrome: Secondary | ICD-10-CM

## 2017-06-21 DIAGNOSIS — R079 Chest pain, unspecified: Secondary | ICD-10-CM | POA: Diagnosis present

## 2017-06-21 DIAGNOSIS — G9331 Postviral fatigue syndrome: Secondary | ICD-10-CM

## 2017-06-21 LAB — CBC
HCT: 45.9 % (ref 35.0–47.0)
Hemoglobin: 15.6 g/dL (ref 12.0–16.0)
MCH: 31.3 pg (ref 26.0–34.0)
MCHC: 34 g/dL (ref 32.0–36.0)
MCV: 92.1 fL (ref 80.0–100.0)
Platelets: 185 K/uL (ref 150–440)
RBC: 4.98 MIL/uL (ref 3.80–5.20)
RDW: 12.9 % (ref 11.5–14.5)
WBC: 4.5 K/uL (ref 3.6–11.0)

## 2017-06-21 LAB — COMPREHENSIVE METABOLIC PANEL WITH GFR
ALT: 18 U/L (ref 14–54)
AST: 27 U/L (ref 15–41)
Albumin: 4.5 g/dL (ref 3.5–5.0)
Alkaline Phosphatase: 77 U/L (ref 38–126)
Anion gap: 6 (ref 5–15)
BUN: 10 mg/dL (ref 6–20)
CO2: 23 mmol/L (ref 22–32)
Calcium: 9 mg/dL (ref 8.9–10.3)
Chloride: 108 mmol/L (ref 101–111)
Creatinine, Ser: 0.61 mg/dL (ref 0.44–1.00)
GFR calc Af Amer: >60 mL/min (ref >60)
GFR calc non Af Amer: >60 mL/min (ref >60)
Glucose, Bld: 90 mg/dL (ref 65–99)
Potassium: 4 mmol/L (ref 3.5–5.1)
Sodium: 137 mmol/L (ref 135–145)
Total Bilirubin: 0.9 mg/dL (ref 0.3–1.2)
Total Protein: 7.7 g/dL (ref 6.5–8.1)

## 2017-06-21 LAB — TROPONIN I: Troponin I: <0.03 ng/mL (ref <0.03)

## 2017-06-21 MED ORDER — HYDROCOD POLST-CPM POLST ER 10-8 MG/5ML PO SUER: 5.0000 mL | Freq: Two times a day (BID) | ORAL | 0 refills | Status: AC | PRN

## 2017-06-21 MED ORDER — PREDNISONE 50 MG PO TABS: ORAL_TABLET | ORAL | 0 refills | Status: DC

## 2017-06-21 NOTE — Patient Instructions (Addendum)
Thank you for coming to the office today.  1. Go straight to [facility] hospital ED - will call ahead   Please schedule a Follow-up Appointment to: Return if symptoms worsen or fail to improve.    If you have any other questions or concerns, please feel free to call the office or send a message through [place]. You may also schedule an earlier appointment if necessary.  Additionally, you may be receiving a survey about your experience at our office within a few days to 1 week by e-mail or mail. We value your feedback.  [name], DO [place]

## 2017-06-21 NOTE — ED Triage Notes (Signed)
Chest pain began during night. States has had cough x 1 week.

## 2017-06-21 NOTE — ED Provider Notes (Signed)
[facility] Emergency Department Provider Note  ____________________________________________  Time seen: Approximately 4:16 PM  I have reviewed the triage vital signs and the nursing notes.   HISTORY  Chief Complaint Chest Pain and Cough    HPI Crystal Levine is a 52 y.o. female presents to the emergency department with nonproductive cough for the past week.  Patient reports that her 51 year old daughter was diagnosed with influenza A approximately 1 week ago.  Patient has associated headache, nausea, myalgias and malaise.  She has had chills and night sweats.  Patient reports that she was started on azithromycin by her primary care but not prednisone.  Patient reports that she only has chest pain while coughing.  She denies abdominal pain.   Past Medical History:  Diagnosis Date  . Hypertension     There are no active problems to display for this patient.   Past Surgical History:  Procedure Laterality Date  . CHOLECYSTECTOMY    . GASTRIC BYPASS    . HERNIA REPAIR      Prior to Admission medications   Medication Sig Start Date End Date Taking? Authorizing Provider  chlorpheniramine-HYDROcodone (TUSSIONEX PENNKINETIC ER) 10-8 MG/5ML SUER Take 5 mLs by mouth every 12 (twelve) hours as needed for up to 3 days for cough. 06/21/17 06/24/17  Lannie Fields, PA-C  predniSONE (DELTASONE) 50 MG tablet Take one 50 mg tablet once daily for the next five days. 06/21/17   Lannie Fields, PA-C    Allergies Patient has no known allergies.  No family history on file.  Social History Social History   Tobacco Use  . Smoking status: Never Smoker  Substance Use Topics  . Alcohol use: Not on file  . Drug use: Not on file      Review of Systems  Constitutional: Patient has fever.  Eyes: No visual changes. No discharge ENT: Patient has congestion.  Cardiovascular: no chest pain. Respiratory: Patient has cough.  Gastrointestinal: No abdominal pain.  No  nausea, no vomiting. Patient had diarrhea.  Genitourinary: Negative for dysuria. No hematuria Musculoskeletal: Patient has myalgias.  Skin: Negative for rash, abrasions, lacerations, ecchymosis. Neurological: Patient has headache, no focal weakness or numbness.     ____________________________________________   PHYSICAL EXAM:  VITAL SIGNS: ED Triage Vitals  Enc Vitals Group     BP 06/21/17 1420 (!) 148/88     Pulse Rate 06/21/17 1420 62     Resp 06/21/17 1420 18     Temp 06/21/17 1420 98 F (36.7 C)     Temp Source 06/21/17 1420 Oral     SpO2 06/21/17 1420 99 %     Weight 06/21/17 1415 175 lb (79.4 kg)     Height 06/21/17 1415 5\' 3"  (1.6 m)     Head Circumference --      Peak Flow --      Pain Score 06/21/17 1415 7     Pain Loc --      Pain Edu? --      Excl. in [place]? --      Constitutional: Alert and oriented. Patient is lying supine. Eyes: Conjunctivae are normal. PERRL. EOMI. Head: Atraumatic. ENT:      Ears: Tympanic membranes are mildly injected with mild effusion bilaterally.       Nose: No congestion/rhinnorhea.      Mouth/Throat: Mucous membranes are moist. Posterior pharynx is mildly erythematous.  Hematological/Lymphatic/Immunilogical: No cervical lymphadenopathy.  Cardiovascular: Normal rate, regular rhythm. Normal S1 and S2.  Good peripheral  circulation. Respiratory: Normal respiratory effort without tachypnea or retractions. Lungs CTAB. Good air entry to the bases with no decreased or absent breath sounds. Gastrointestinal: Bowel sounds 4 quadrants. Soft and nontender to palpation. No guarding or rigidity. No palpable masses. No distention. No CVA tenderness. Musculoskeletal: Full range of motion to all extremities. No gross deformities appreciated. Neurologic:  Normal speech and language. No gross focal neurologic deficits are appreciated.  Skin:  Skin is warm, dry and intact. No rash noted. Psychiatric: Mood and affect are normal. Speech and behavior are  normal. Patient exhibits appropriate insight and judgement.    ____________________________________________   LABS (all labs ordered are listed, but only abnormal results are displayed)  Labs Reviewed  CBC  TROPONIN I  COMPREHENSIVE METABOLIC PANEL   ____________________________________________  EKG   ____________________________________________  RADIOLOGY Unk Pinto, personally viewed and evaluated these images (plain radiographs) as part of my medical decision making, as well as reviewing the written report by the radiologist.  Dg Chest 2 View  Result Date: 06/21/2017 CLINICAL DATA:  Mid chest pain for 1 week, worsening since last night. History of hypertension. EXAM: CHEST  2 VIEW COMPARISON:  None. FINDINGS: Cardiomediastinal silhouette is normal. No pleural effusions or focal consolidations. Trachea projects midline and there is no pneumothorax. Soft tissue planes and included osseous structures are non-suspicious. Surgical clips in the included right abdomen compatible with cholecystectomy. IMPRESSION: Negative. Electronically Signed   By: Elon Alas M.D.   On: 06/21/2017 15:04    ____________________________________________    PROCEDURES  Procedure(s) performed:    Procedures    Medications - No data to display   ____________________________________________   INITIAL IMPRESSION / ASSESSMENT AND PLAN / ED COURSE  Pertinent labs & imaging results that were available during my care of the patient were reviewed by me and considered in my medical decision making (see chart for details).  Review of the [place] CSRS was performed in accordance of the [place] prior to dispensing any controlled drugs.    Assessment and plan Influenza-like illness Patient presents to the emergency department with headache, nonproductive cough, chills and chest wall discomfort.  Patient has known sick contacts with influenza A in her household.  Original differential  diagnosis included community-acquired pneumonia, acute bronchitis and unspecified viral URI.  Patient was treated empirically with prednisone and Tussionex for cough.  Rest and hydration were encouraged.  Patient education was provided regarding the course of influenza.  Patient was advised to follow-up with her primary care.  All patient questions were answered.   ____________________________________________  FINAL CLINICAL IMPRESSION(S) / ED DIAGNOSES  Final diagnoses:  Influenza-like illness      NEW MEDICATIONS STARTED DURING THIS VISIT:  ED Discharge Orders        Ordered    predniSONE (DELTASONE) 50 MG tablet     06/21/17 1615    chlorpheniramine-HYDROcodone (TUSSIONEX PENNKINETIC ER) 10-8 MG/5ML SUER  Every 12 hours PRN     06/21/17 1615          This chart was dictated using voice recognition software/Dragon. Despite best efforts to proofread, errors can occur which can change the meaning. Any change was purely unintentional.    Lannie Fields, PA-C 06/21/17 1653    Eula Listen, MD 06/21/17 [phone]

## 2017-06-21 NOTE — Progress Notes (Signed)
Subjective:    Patient ID: Crystal Levine, female    DOB: 19-Apr-1966, 52 y.o.   MRN: [id number]  Crystal Levine is a 52 y.o. female presenting on 06/21/2017 for Cough (HA, bodyache, chills, ear pain when blowing nose, cough worst)  Patient presents for a same day appointment. Patient accompanied by husband, [name]  HPI   Pleuritic Chest Pain / Dyspnea / History of Flu Exposure - Last visit with me 06/18/17, for initial visit for related problem for post-flu exposure with family members daughter had influenza A and then she developed cough and URI symptoms, treated with 5 days BID Tamiflu already then had some improvement then more persistent coughing, was treated with Azithromycin Z pak, Tessalon Mucinex and supportive care, for possible bronchitis that time she was relatively well appearing and we decided to forego chest x-ray or other testing, see prior notes for background information. - Interval update with reported worsening symptoms over past 24-48 hours, over weekend - Today patient reports now worsening pain with deeper breathing, across mid and chest, cannot take deep breaths, still deeper cough makes pain worse, some productive - taking Azithromycin Z-pak without significant relief - Her daughter is feeling better and husband did not experience flu like symptoms - Admits history of bodyaches, chills, some ear pain and sinus pressure, headache - Denies nausea vomiting diarrhea abdominal pain, leg swelling  Did not receive influenza vaccine this season.  Depression screen [facility] 2/9 08/24/2016 06/05/2016 07/09/2015  Decreased Interest 0 0 -  Down, Depressed, Hopeless 0 0 1  PHQ - 2 Score 0 0 1  Altered sleeping - - 3  Tired, decreased energy - - 2  Change in appetite - - 2  Feeling bad or failure about yourself  - - 1  Trouble concentrating - - 1  Moving slowly or fidgety/restless - - 2  Suicidal thoughts - - 0  PHQ-9 Score - - 12  Difficult doing work/chores - - Somewhat  difficult    Social History   Tobacco Use  . Smoking status: Never Smoker  . Smokeless tobacco: Never Used  Substance Use Topics  . Alcohol use: No  . Drug use: No    Review of Systems Per HPI unless specifically indicated above     Objective:    BP (!) 153/97   Pulse (!) 59   Temp 98.1 F (36.7 C) (Other (Comment))   Resp 18   Ht 5' 4.5" (1.638 m)   Wt 176 lb (79.8 kg)   SpO2 100%   BMI 29.74 kg/m   Wt Readings from Last 3 Encounters:  06/21/17 176 lb (79.8 kg)  06/18/17 179 lb (81.2 kg)  05/27/17 174 lb (78.9 kg)    Physical Exam  Constitutional: She is oriented to person, place, and time. She appears well-developed and well-nourished. No distress.  Moderately ill and tired appearing, uncomfortable with breathing and pain, initially laying down on exam table, cooperative  HENT:  Head: Normocephalic and atraumatic.  Mouth/Throat: Oropharynx is clear and moist.  Eyes: Conjunctivae are normal. Right eye exhibits no discharge. Left eye exhibits no discharge.  Neck: Normal range of motion. Neck supple.  Cardiovascular: Normal rate, regular rhythm, normal heart sounds and intact distal pulses.  No murmur heard. Pulmonary/Chest: She has no wheezes. She has no rales. She exhibits no tenderness (not reproduced).  Significant worsening appearance today with dramatically worse work of breathing compared to last visit 06/18/17. Lung fields seem to be relatively clear, difficulty due  to resp effort cannot take deep breaths, cannot tell if any focal abnormality. No obvious wheezing or coarse sounds.  Musculoskeletal: Normal range of motion. She exhibits no edema.  Lymphadenopathy:    She has no cervical adenopathy.  Neurological: She is alert and oriented to person, place, and time.  Skin: Skin is warm and dry. No rash noted. She is not diaphoretic. No erythema.  Nursing note and vitals reviewed.  Results for orders placed or performed in visit on 05/12/17  Pathology  Result  Value Ref Range   . Comment    . Comment    . Comment    . Comment    . Comment    . Comment   Pathology  Result Value Ref Range   . Comment    . Comment    . Comment    . Comment    . Comment    . Comment   Pathology  Result Value Ref Range   . Comment    . Comment    . Comment    . Comment    . Comment    . Comment   Pathology  Result Value Ref Range   . Comment    . Comment    . Comment    . Comment    . Comment    . Comment       Assessment & Plan:   Problem List Items Addressed This Visit    None    Visit Diagnoses    Pleuritic chest pain    -  Primary   Atypical chest pain       Post-influenza syndrome          Concern now with relatively acute worsening in symptoms productive cough, pleuritic chest pain difficulty breathing with dyspnea, unable to take deep breaths. Recent flu exposure and empiric treatment and then also covered with antibiotic Z-pak for possible bronchitis post flu. Cannot rule out PNA or other complication from flu, also possible that she had other URI symptoms before and now may have actual influenza infection is possible Hemodynamically stable in office with elevated BP due to pain, not tachycardic, clinically seems tachypnea on exam with normal O2 sat on room air 97 to 100%  Plan Discussed likely needs imaging including CXR vs CT depending on course Given her clinical appearance of acute respiratory discomfort without active respiratory distress, agree that she should be evaluated by Emergency Dept more immediately rather than waiting up to 1 hour for a STAT Chest X-ray result, also we have already covered her with multiple treatment options in past 1-2 weeks and I have limited else to offer, already s/p Tamiflu, Azithromycin, Tessalon , OTC meds and mucinex - She may require repeat flu testing in addition to imaging, and consider alternative antibiotic vs anti viral course - She will travel by personal vehicle to [facility] ED, husband will  drive - Note for work provided - Follow-up as needed  I have called [facility] ED and reviewed case with nurse triage, Tiffany, gave patient identifiers and chief complaint and my concern, they will be expecting her arrival.  No orders of the defined types were placed in this encounter.   Follow up plan: Return if symptoms worsen or fail to improve.  [name], [place] [facility] 06/21/2017, 1:51 PM

## 2017-06-22 ENCOUNTER — Encounter: Payer: Self-pay | Admitting: Family Medicine

## 2017-07-01 ENCOUNTER — Ambulatory Visit: Payer: Self-pay | Admitting: Gastroenterology

## 2017-07-01 NOTE — Progress Notes (Deleted)
ANNUAL PREVENTATIVE CARE GYN  ENCOUNTER NOTE  Subjective:       Crystal Levine is a 52 y.o. [phone] female here for a routine annual gynecologic exam.  Current complaints: 1.      Gynecologic History No LMP recorded. Patient has had an ablation. Contraception: none Last Pap: 04/21/2017 neg/pos. Colpo- 05/12/2017 all neg- f/u 6 months-  Results were: normal Last mammogram: 2016. Results were: normal  Obstetric History OB History  Gravida Para Term Preterm AB Living  5 3 3  0 2 3  SAB TAB Ectopic Multiple Live Births  2 0 0   3    # Outcome Date GA Lbr Len/2nd Weight Sex Delivery Anes PTL Lv  5 Term 2006   6 lb 2.1 oz (2.781 kg) F Vag-Spont   LIV  4 SAB 2003          3 SAB 2003          2 Term 1999   8 lb 1.9 oz (3.683 kg) F Vag-Spont   LIV  1 Term 1993   8 lb 2.1 oz (3.688 kg) M Vag-Spont   LIV      Past Medical History:  Diagnosis Date  . Anemia   . Anxiety   . Depression   . Hypertension   . Migraine    In past    Past Surgical History:  Procedure Laterality Date  . CHOLECYSTECTOMY    . COLONOSCOPY WITH PROPOFOL N/A 05/27/2017   Procedure: COLONOSCOPY WITH PROPOFOL;  Surgeon: Lin Landsman, MD;  [facility];  Service: Gastroenterology;  Laterality: N/A;  . DILATION AND CURETTAGE OF UTERUS  2003   2 Miscarriages  . DILITATION & CURRETTAGE/HYSTROSCOPY WITH NOVASURE ABLATION N/A 12/16/2015   Procedure: DILATATION & CURETTAGE/HYSTEROSCOPY WITH NOVASURE ABLATION;  Surgeon: Brayton Mars, MD;  Location: [facility] ORS;  Service: Gynecology;  Laterality: N/A;  . GALLBLADDER SURGERY  2003  . GASTRIC BYPASS  2004  . GASTRIC BYPASS    . HERNIA REPAIR  2005  . HERNIA REPAIR      Current Outpatient Medications on File Prior to Visit  Medication Sig Dispense Refill  . amLODipine (NORVASC) 10 MG tablet Take 1 tablet (10 mg total) by mouth daily. 90 tablet 3  . azithromycin (ZITHROMAX Z-PAK) 250 MG tablet Take 2 tabs (500mg  total) on Day 1. Take 1 tab  (250mg ) daily for next 4 days. 6 tablet 0  . baclofen (LIORESAL) 10 MG tablet Take 1 tablet (10 mg total) by mouth 3 (three) times daily. 30 each 0  . benzonatate (TESSALON) 100 MG capsule Take 1 capsule (100 mg total) by mouth 3 (three) times daily as needed for cough. 30 capsule 0  . butalbital-acetaminophen-caffeine (FIORICET, ESGIC) 50-325-40 MG tablet Take 1-2 tablets by mouth every 6 (six) hours as needed for headache. 20 tablet 0  . cyanocobalamin (,VITAMIN B-12,) 1000 MCG/ML injection Inject 1 mL (1,000 mcg total) into the muscle every 30 (thirty) days. 1 mL 11  . oseltamivir (TAMIFLU) 75 MG capsule Take 1 capsule (75 mg total) by mouth daily. For 10 days. If develop symptoms change dosing to twice daily to finish course (Patient not taking: Reported on 06/18/2017) 10 capsule 0  . predniSONE (DELTASONE) 50 MG tablet Take one 50 mg tablet once daily for the next five days. 5 tablet 0  . Vitamin D, Ergocalciferol, (DRISDOL) 50000 units CAPS capsule Take 1 capsule (50,000 Units total) by mouth every 7 (seven) days. 12 capsule 0  No current facility-administered medications on file prior to visit.     No Known Allergies  Social History   Socioeconomic History  . Marital status: Married    Spouse name: Not on file  . Number of children: Not on file  . Years of education: Not on file  . Highest education level: Not on file  Social Needs  . Financial resource strain: Not on file  . Food insecurity - worry: Not on file  . Food insecurity - inability: Not on file  . Transportation needs - medical: Not on file  . Transportation needs - non-medical: Not on file  Occupational History  . Not on file  Tobacco Use  . Smoking status: Never Smoker  Substance and Sexual Activity  . Alcohol use: No  . Drug use: No  . Sexual activity: Yes    Comment: vasectomy  Other Topics Concern  . Not on file  Social History Narrative   ** Merged History Encounter **        Family History   Adopted: Yes  Family history unknown: Yes    The following portions of the patient's history were reviewed and updated as appropriate: allergies, current medications, past family history, past medical history, past social history, past surgical history and problem list.  Review of Systems ROS Review of Systems - General ROS: negative for - chills, fatigue, fever, hot flashes, night sweats, weight gain or weight loss Psychological ROS: negative for - anxiety, decreased libido, depression, mood swings, physical abuse or sexual abuse Ophthalmic ROS: negative for - blurry vision, eye pain or loss of vision ENT ROS: negative for - hearing change, visual changes or vocal changes. POSITIVE-headaches Allergy and Immunology ROS: negative for - hives, itchy/watery eyes or seasonal allergies Hematological and Lymphatic ROS: negative for - bleeding problems, bruising, swollen lymph nodes or weight loss Endocrine ROS: negative for - galactorrhea, hair pattern changes, hot flashes, malaise/lethargy, mood swings, palpitations, polydipsia/polyuria, skin changes, temperature intolerance or unexpected weight changes Breast ROS: negative for - new or changing breast lumps or nipple discharge Respiratory ROS: negative for - cough or shortness of breath Cardiovascular ROS: negative for - chest pain, irregular heartbeat, palpitations or shortness of breath Gastrointestinal ROS: no abdominal pain, change in bowel habits, or black or bloody stools Genito-Urinary ROS: no dysuria, trouble voiding, or hematuria. Positive-S incontinence Musculoskeletal ROS: negative for - joint pain or joint stiffness Neurological ROS: negative for - bowel and bladder control changes Dermatological ROS: negative for rash and skin lesion changes    Objective:  There were no vitals taken for this visit. CONSTITUTIONAL: Well- developed, well-nourished female in no acute distress.  PSYCHIATRIC: Normal mood and affect. Normal behavior.  Normal judgment and thought content. [place]: Alert and oriented to person, place, and time. Normal muscle tone coordination. No cranial nerve deficit noted. HENT:  Normocephalic, atraumatic, External right and left ear normal. Oropharynx is clear and moist EYES: Conjunctivae and EOM are normal. Pupils are equal, round, and reactive to light. No scleral icterus.  NECK: Normal range of motion, supple, no masses.  Normal thyroid.  SKIN: Skin is warm and dry. No rash noted. Not diaphoretic. No erythema. No pallor. CARDIOVASCULAR: Normal heart rate noted, regular PX1/0 systolic murmur left sternal borderr. RESPIRATORY: Clear to auscultation bilaterally. Effort and breath sounds normal, no problems with respiration noted. BREASTS: Symmetric in size. No masses, skin changes, nipple drainage, or lymphadenopathy. ABDOMEN: Soft, normal bowel sounds, no distention noted.  No tenderness, rebound or guarding.  BLADDER: Normal PELVIC:  External Genitalia: Normal  BUS: Normal  Vaginamildly atrophic; no lesions  [place]: Normal; parous, no cervical motion tenderness  Uterus: Normal; midplane, mobile, nontender, normal size and shape  Adnexa: Normal; nonpalpable and nontender  RV: External Exam NormaI, No Rectal Masses and Normal Sphincter tone  MUSCULOSKELETAL: Normal range of motion. No tenderness.  No cyanosis, clubbing, or edema.  2+ distal pulses. LYMPHATIC: No Axillary, Supraclavicular, or Inguinal Adenopathy.    Assessment:   Annual gynecologic examination 52 y.o. Contraception: none [id number] Problem List Items Addressed This Visit    History of endometrial ablation    Other Visit Diagnoses    Well woman exam with routine gynecological exam    -  Primary   Screening for breast cancer       LGSIL on Pap smear of cervix        Amenorrhea, status post endometrial ablation Climacteric, asymptomatic   Plan:  Pap: Due 04/2018 Mammogram: Ordered Stool Guaiac Testing:  Not Indicated Labs:  thur pcp Routine preventative health maintenance measures emphasized: Exercise/Diet/Weight control, Tobacco Warnings and Alcohol/Substance use risks Return to [place], [place]   Note: This dictation was prepared with Dragon dictation along with smaller phrase technology. Any transcriptional errors that result from this process are unintentional.

## 2017-07-06 ENCOUNTER — Encounter: Payer: Managed Care, Other (non HMO) | Admitting: Obstetrics and Gynecology

## 2017-07-08 ENCOUNTER — Encounter: Payer: Self-pay | Admitting: Gastroenterology

## 2017-07-09 ENCOUNTER — Other Ambulatory Visit: Payer: Self-pay | Admitting: Family Medicine

## 2017-07-09 DIAGNOSIS — Z20828 Contact with and (suspected) exposure to other viral communicable diseases: Secondary | ICD-10-CM

## 2017-09-01 ENCOUNTER — Encounter: Payer: Self-pay | Admitting: Family Medicine

## 2017-09-01 ENCOUNTER — Ambulatory Visit (INDEPENDENT_AMBULATORY_CARE_PROVIDER_SITE_OTHER): Payer: Managed Care, Other (non HMO) | Admitting: Family Medicine

## 2017-09-01 VITALS — BP 145/72 | HR 64 | Temp 98.3°F | Resp 16 | Ht 64.5 in | Wt 180.0 lb

## 2017-09-01 DIAGNOSIS — F99 Mental disorder, not otherwise specified: Secondary | ICD-10-CM | POA: Diagnosis not present

## 2017-09-01 DIAGNOSIS — F419 Anxiety disorder, unspecified: Secondary | ICD-10-CM

## 2017-09-01 DIAGNOSIS — F5105 Insomnia due to other mental disorder: Secondary | ICD-10-CM

## 2017-09-01 DIAGNOSIS — F32A Depression, unspecified: Secondary | ICD-10-CM

## 2017-09-01 DIAGNOSIS — F329 Major depressive disorder, single episode, unspecified: Secondary | ICD-10-CM | POA: Diagnosis not present

## 2017-09-01 MED ORDER — ESCITALOPRAM OXALATE 10 MG PO TABS
10.0000 mg | ORAL_TABLET | Freq: Every day | ORAL | 2 refills | Status: DC
Start: 2017-09-01 — End: 2017-09-23

## 2017-09-01 MED ORDER — HYDROXYZINE HCL 25 MG PO TABS: 25.0000 mg | ORAL_TABLET | Freq: Two times a day (BID) | ORAL | 1 refills | Status: DC | PRN

## 2017-09-01 NOTE — Assessment & Plan Note (Signed)
Suspected acute on chronic flare of anxiety w/ panic and mixed mood disorder depression - possibly adjustment component, now triggered by multiple significant life stressors - Currently some difficulty functioning, previously coped well, now affecting physically with fatigue from poor sleep / worrying, family stress is primary stressor. Suspected insomnia is secondary to anxiety/mood. -GAD7: 12, somewhat difficult / PHQ9: 11 somewhat - Past med Lexapro 10 - effective. Failed Wellbutrin No prior Psychiatry  Plan: 1. Discussion on new diagnosis anxiety, management, complications, likely contributing to insomnia 2. RESTART Escitalopram 10mg  daily AM with food, counseling on potential side effects risks, reviewed possible GI intolerance, insomnia (although likely to improve this given anxiety likely source of insomnia), reviewed black box warning inc suicidal (no prior history, unlikely concern) - anticipate 4-6 weeks for notable effect, may need titrate dose to 20 in future - Add rx for PRN - Hydroxzyine 25 take 1-2 pills up to 2 times daily PRN anxiety/panic/insomnia - caution sedation 3. Advised recommend therapy / counseling in future - continue with current work counselor monthly - offered can refer to other psychology / therapist if need 4. Follow-up 4-6 weeks anxiety / mood med adjust, GAD7/PHQ9 - future may need other agent for sleep if not effective

## 2017-09-01 NOTE — Progress Notes (Signed)
Subjective:    Patient ID: TIA HIERONYMUS, female    DOB: 31-Mar-1966, 52 y.o.   MRN: [id number]  Crystal Levine is a 52 y.o. female presenting on 09/01/2017 for Anxiety (onset month getting couple of weeks)  Patient presents for a same day appointment.  HPI   Acute on chronic ANXIETY with Panic / Depression / Insomnia Background history briefly, episodes of depression and anxiety with panic attacks in past, last seen by previous PCP Crystal Levine in 2017, was treated with Lexapro 10mg  for this with good results. She has tried Wellbutrin in past, did not do well on it, thought high dose and it caused side effects, sedation and felt like a "zombie". - She denies having chronic persistent anxiety and depression, and seems to have "episodes" if certain stressors worse - Now today here due to significant life stressors and anxiety, a "lot on me", she said last few weeks worsening difficulty. Describes panic attacks, She breathes very hard and feels like "chest is going to explode", She feels like she is "taking care of the world" and "not herself", Described a few ordeals with her daughter and family stress - Additional stressors with her husband, Crystal Levine and his health - Additionally most bothered by insomnia, difficulty falling asleep and will often wake back up overnight, she has episodes of panic attacks, often triggered by certain thoughts or other stressors, and frequently at night will prevent her from sleeping - Work is limited stress, but she is bogged down by home stressors - Not taking any medicines for mood or anxiety - Followed by counselor at work, monthly visit, for past >9 months which has been helpful - Never seen Psychiatry Elevated GAD and PHQ scores - Denies any risk to her own safety regarding situations at home or other locations, she feels safe and comfortable   Depression screen [facility] 2/9 09/01/2017 08/24/2016 06/05/2016  Decreased Interest 0 0 0  Down, Depressed, Hopeless 1 0 0   PHQ - 2 Score 1 0 0  Altered sleeping 3 - -  Tired, decreased energy 2 - -  Change in appetite 2 - -  Feeling bad or failure about yourself  1 - -  Trouble concentrating 1 - -  Moving slowly or fidgety/restless 1 - -  Suicidal thoughts 0 - -  PHQ-9 Score 11 - -  Difficult doing work/chores Somewhat difficult - -   GAD 7 : Generalized Anxiety Score 09/01/2017 09/04/2015 07/09/2015  Nervous, Anxious, on Edge 2 1 2   Control/stop worrying 1 1 3   Worry too much - different things 3 1 3   Trouble relaxing 2 2 3   Restless 2 0 2  Easily annoyed or irritable 1 0 1  Afraid - awful might happen 1 0 1  Total GAD 7 Score 12 5 15   Anxiety Difficulty Somewhat difficult Not difficult at all Somewhat difficult     Social History   Tobacco Use  . Smoking status: Never Smoker  . Smokeless tobacco: Never Used  Substance Use Topics  . Alcohol use: No  . Drug use: No    Review of Systems Per HPI unless specifically indicated above     Objective:    BP (!) 145/72   Pulse 64   Temp 98.3 F (36.8 C) (Oral)   Resp 16   Ht 5' 4.5" (1.638 m)   Wt 180 lb (81.6 kg)   BMI 30.42 kg/m   Wt Readings from Last 3 Encounters:  09/01/17 180 lb (  81.6 kg)  06/21/17 175 lb (79.4 kg)  06/21/17 176 lb (79.8 kg)    Physical Exam  Constitutional: She is oriented to person, place, and time. She appears well-developed and well-nourished. No distress.  Well-appearing, comfortable, cooperative  HENT:  Head: Normocephalic and atraumatic.  Mouth/Throat: Oropharynx is clear and moist.  Eyes: Conjunctivae are normal. Right eye exhibits no discharge. Left eye exhibits no discharge.  Neck: Normal range of motion. Neck supple. No thyromegaly present.  Cardiovascular: Normal rate, regular rhythm, normal heart sounds and intact distal pulses.  No murmur heard. Pulmonary/Chest: Effort normal and breath sounds normal. No respiratory distress. She has no wheezes. She has no rales.  Musculoskeletal: Normal range of  motion. She exhibits no edema.  Lymphadenopathy:    She has no cervical adenopathy.  Neurological: She is alert and oriented to person, place, and time.  Skin: Skin is warm and dry. No rash noted. She is not diaphoretic. No erythema.  Psychiatric: Her behavior is normal.  Well groomed, good eye contact, normal speech and thoughts. Anxious at times, and tearful episode depending on topic of discussion.  Nursing note and vitals reviewed.  Results for orders placed or performed during the hospital encounter of 06/21/17  CBC  Result Value Ref Range   WBC 4.5 3.6 - 11.0 K/uL   RBC 4.98 3.80 - 5.20 MIL/uL   Hemoglobin 15.6 12.0 - 16.0 g/dL   HCT 45.9 35.0 - 47.0 %   MCV 92.1 80.0 - 100.0 fL   MCH 31.3 26.0 - 34.0 pg   MCHC 34.0 32.0 - 36.0 g/dL   RDW 12.9 11.5 - 14.5 %   Platelets 185 150 - 440 K/uL  Troponin I  Result Value Ref Range   Troponin I <0.03 <0.03 ng/mL  Comprehensive metabolic panel  Result Value Ref Range   Sodium 137 135 - 145 mmol/L   Potassium 4.0 3.5 - 5.1 mmol/L   Chloride 108 101 - 111 mmol/L   CO2 23 22 - 32 mmol/L   Glucose, Bld 90 65 - 99 mg/dL   BUN 10 6 - 20 mg/dL   Creatinine, Ser 0.61 0.44 - 1.00 mg/dL   Calcium 9.0 8.9 - 10.3 mg/dL   Total Protein 7.7 6.5 - 8.1 g/dL   Albumin 4.5 3.5 - 5.0 g/dL   AST 27 15 - 41 U/L   ALT 18 14 - 54 U/L   Alkaline Phosphatase 77 38 - 126 U/L   Total Bilirubin 0.9 0.3 - 1.2 mg/dL   GFR calc non Af Amer >60 >60 mL/min   GFR calc Af Amer >60 >60 mL/min   Anion gap 6 5 - 15      Assessment & Plan:   Problem List Items Addressed This Visit    Anxiety and depression - Primary    Suspected acute on chronic flare of anxiety w/ panic and mixed mood disorder depression - possibly adjustment component, now triggered by multiple significant life stressors - Currently some difficulty functioning, previously coped well, now affecting physically with fatigue from poor sleep / worrying, family stress is primary stressor.  Suspected insomnia is secondary to anxiety/mood. -GAD7: 12, somewhat difficult / PHQ9: 11 somewhat - Past med Lexapro 10 - effective. Failed Wellbutrin No prior Psychiatry  Plan: 1. Discussion on new diagnosis anxiety, management, complications, likely contributing to insomnia 2. RESTART Escitalopram 10mg  daily AM with food, counseling on potential side effects risks, reviewed possible GI intolerance, insomnia (although likely to improve this given anxiety likely  source of insomnia), reviewed black box warning inc suicidal (no prior history, unlikely concern) - anticipate 4-6 weeks for notable effect, may need titrate dose to 20 in future - Add rx for PRN - Hydroxzyine 25 take 1-2 pills up to 2 times daily PRN anxiety/panic/insomnia - caution sedation 3. Advised recommend therapy / counseling in future - continue with current work counselor monthly - offered can refer to other psychology / therapist if need 4. Follow-up 4-6 weeks anxiety / mood med adjust, GAD7/PHQ9 - future may need other agent for sleep if not effective      Relevant Medications   escitalopram (LEXAPRO) 10 MG tablet   hydrOXYzine (ATARAX/VISTARIL) 25 MG tablet   Insomnia    Secondary to acute worsening anxiety/panic and mood disorder, seems to have adjustment component due to significant life stressors - not on therapy currently - Problem with sleep onset and maintenance See A&P for Anxiety/Depression - Restart SSRI Lexapro 10mg  - future titrate up if need - Add Hydroxyzine PRN anxiety/panic/insomnia Future may reconsider alternative meds such as a hypnotic agent such as Ambien if truly need help sleeping temporarily      Relevant Medications   escitalopram (LEXAPRO) 10 MG tablet   hydrOXYzine (ATARAX/VISTARIL) 25 MG tablet      Note for work today  Meds ordered this encounter  Medications  . escitalopram (LEXAPRO) 10 MG tablet    Sig: Take 1 tablet (10 mg total) by mouth daily. May increase to 20mg  after 4  weeks    Dispense:  30 tablet    Refill:  2  . hydrOXYzine (ATARAX/VISTARIL) 25 MG tablet    Sig: Take 1-2 tablets (25-50 mg total) by mouth 2 (two) times daily as needed for anxiety (insomnia).    Dispense:  30 tablet    Refill:  1    Follow up plan: Return in about 1 month (around 09/29/2017) for Anxiety / Depression / Insomnia med adjust GAD/PHQ.  [name], [facility] 09/01/2017, 12:50 PM

## 2017-09-01 NOTE — Patient Instructions (Addendum)
Thank you for coming to the office today.  As discussed, it sounds like your symptoms are primarily related to anxiety / flare up vs adjustment disorder. This is a very common problem and be related to several factors, including life stressors. Start treatment with Escitalopram (Lexapro) 10mg , take 1 daily for next 4 weeks. As discussed most anxiety medications are also used for mood disorders such as depression, because they work on similar chemicals in your brain. It may take up to 3-4 weeks for the medicine to take full effect and for you to notice a difference, sometimes you may notice it working sooner, otherwise we may need to adjust the dose.  Keep up with your Therapist / Counselor at work - if we need to get you another one let me know  Added Hydroxyzine for panic and sleep as well - or acute anxiety - take 1-2 pills as needed up to 2 times daily - caution sedation drowsy  If we need to change meds we can in future - consider sleeping pill   Please schedule a Follow-up Appointment to: Return in about 1 month (around 09/29/2017) for Anxiety / Depression / Insomnia med adjust GAD/PHQ.  If you have any other questions or concerns, please feel free to call the office or send a message through [place]. You may also schedule an earlier appointment if necessary.  Additionally, you may be receiving a survey about your experience at our office within a few days to 1 week by e-mail or mail. We value your feedback.  [name], DO [place]

## 2017-09-01 NOTE — Assessment & Plan Note (Signed)
Secondary to acute worsening anxiety/panic and mood disorder, seems to have adjustment component due to significant life stressors - not on therapy currently - Problem with sleep onset and maintenance See A&P for Anxiety/Depression - Restart SSRI Lexapro 10mg  - future titrate up if need - Add Hydroxyzine PRN anxiety/panic/insomnia Future may reconsider alternative meds such as a hypnotic agent such as Ambien if truly need help sleeping temporarily

## 2017-09-20 ENCOUNTER — Inpatient Hospital Stay: Payer: Managed Care, Other (non HMO) | Attending: Internal Medicine

## 2017-09-20 DIAGNOSIS — Z9884 Bariatric surgery status: Secondary | ICD-10-CM | POA: Insufficient documentation

## 2017-09-20 DIAGNOSIS — F418 Other specified anxiety disorders: Secondary | ICD-10-CM | POA: Diagnosis not present

## 2017-09-20 DIAGNOSIS — Z79899 Other long term (current) drug therapy: Secondary | ICD-10-CM | POA: Diagnosis not present

## 2017-09-20 DIAGNOSIS — I10 Essential (primary) hypertension: Secondary | ICD-10-CM | POA: Diagnosis not present

## 2017-09-20 DIAGNOSIS — R002 Palpitations: Secondary | ICD-10-CM | POA: Insufficient documentation

## 2017-09-20 DIAGNOSIS — K909 Intestinal malabsorption, unspecified: Secondary | ICD-10-CM | POA: Diagnosis not present

## 2017-09-20 DIAGNOSIS — D509 Iron deficiency anemia, unspecified: Secondary | ICD-10-CM | POA: Insufficient documentation

## 2017-09-20 DIAGNOSIS — R079 Chest pain, unspecified: Secondary | ICD-10-CM | POA: Insufficient documentation

## 2017-09-20 DIAGNOSIS — R0602 Shortness of breath: Secondary | ICD-10-CM | POA: Insufficient documentation

## 2017-09-20 LAB — CBC WITH DIFFERENTIAL/PLATELET
BASOS ABS: 0 10*3/uL (ref 0–0.1)
Basophils Relative: 1 %
EOS PCT: 4 %
Eosinophils Absolute: 0.2 10*3/uL (ref 0–0.7)
HEMATOCRIT: 40.5 % (ref 35.0–47.0)
Hemoglobin: 14.2 g/dL (ref 12.0–16.0)
LYMPHS ABS: 1.6 10*3/uL (ref 1.0–3.6)
LYMPHS PCT: 36 %
MCH: 32.4 pg (ref 26.0–34.0)
MCHC: 34.9 g/dL (ref 32.0–36.0)
MCV: 92.9 fL (ref 80.0–100.0)
MONO ABS: 0.3 10*3/uL (ref 0.2–0.9)
MONOS PCT: 7 %
NEUTROS ABS: 2.4 10*3/uL (ref 1.4–6.5)
Neutrophils Relative %: 52 %
PLATELETS: 182 10*3/uL (ref 150–440)
RBC: 4.36 MIL/uL (ref 3.80–5.20)
RDW: 13.3 % (ref 11.5–14.5)
WBC: 4.5 10*3/uL (ref 3.6–11.0)

## 2017-09-20 LAB — COMPREHENSIVE METABOLIC PANEL
ALBUMIN: 3.9 g/dL (ref 3.5–5.0)
ALT: 17 U/L (ref 14–54)
ANION GAP: 6 (ref 5–15)
AST: 24 U/L (ref 15–41)
Alkaline Phosphatase: 58 U/L (ref 38–126)
BILIRUBIN TOTAL: 0.5 mg/dL (ref 0.3–1.2)
BUN: 14 mg/dL (ref 6–20)
CHLORIDE: 109 mmol/L (ref 101–111)
CO2: 21 mmol/L — AB (ref 22–32)
Calcium: 8.6 mg/dL — ABNORMAL LOW (ref 8.9–10.3)
Creatinine, Ser: 0.54 mg/dL (ref 0.44–1.00)
GFR calc Af Amer: >60 mL/min (ref >60)
GFR calc non Af Amer: >60 mL/min (ref >60)
GLUCOSE: 80 mg/dL (ref 65–99)
POTASSIUM: 4.5 mmol/L (ref 3.5–5.1)
SODIUM: 136 mmol/L (ref 135–145)
Total Protein: 6.5 g/dL (ref 6.5–8.1)

## 2017-09-20 LAB — IRON AND TIBC
Iron: 71 ug/dL (ref 28–170)
SATURATION RATIOS: 23 % (ref 10.4–31.8)
TIBC: 304 ug/dL (ref 250–450)
UIBC: 233 ug/dL

## 2017-09-20 LAB — FERRITIN: FERRITIN: 181 ng/mL (ref 11–307)

## 2017-09-22 ENCOUNTER — Other Ambulatory Visit: Payer: Self-pay

## 2017-09-22 ENCOUNTER — Inpatient Hospital Stay: Payer: Managed Care, Other (non HMO)

## 2017-09-22 ENCOUNTER — Encounter: Payer: Self-pay | Admitting: Internal Medicine

## 2017-09-22 ENCOUNTER — Inpatient Hospital Stay (HOSPITAL_BASED_OUTPATIENT_CLINIC_OR_DEPARTMENT_OTHER): Payer: Managed Care, Other (non HMO) | Admitting: Internal Medicine

## 2017-09-22 VITALS — BP 124/86 | HR 66 | Temp 97.4°F | Resp 12 | Ht 63.0 in | Wt 189.0 lb

## 2017-09-22 DIAGNOSIS — I10 Essential (primary) hypertension: Secondary | ICD-10-CM | POA: Diagnosis not present

## 2017-09-22 DIAGNOSIS — R0602 Shortness of breath: Secondary | ICD-10-CM

## 2017-09-22 DIAGNOSIS — Z79899 Other long term (current) drug therapy: Secondary | ICD-10-CM | POA: Diagnosis not present

## 2017-09-22 DIAGNOSIS — F418 Other specified anxiety disorders: Secondary | ICD-10-CM | POA: Diagnosis not present

## 2017-09-22 DIAGNOSIS — D509 Iron deficiency anemia, unspecified: Secondary | ICD-10-CM | POA: Diagnosis not present

## 2017-09-22 DIAGNOSIS — R002 Palpitations: Secondary | ICD-10-CM

## 2017-09-22 DIAGNOSIS — D508 Other iron deficiency anemias: Secondary | ICD-10-CM

## 2017-09-22 DIAGNOSIS — K909 Intestinal malabsorption, unspecified: Secondary | ICD-10-CM | POA: Diagnosis not present

## 2017-09-22 DIAGNOSIS — Z9884 Bariatric surgery status: Secondary | ICD-10-CM

## 2017-09-22 DIAGNOSIS — E538 Deficiency of other specified B group vitamins: Secondary | ICD-10-CM

## 2017-09-22 DIAGNOSIS — R079 Chest pain, unspecified: Secondary | ICD-10-CM

## 2017-09-22 MED ORDER — CYANOCOBALAMIN 1000 MCG/ML IJ SOLN: 1000.0000 ug | INTRAMUSCULAR | 11 refills | Status: DC

## 2017-09-22 MED ORDER — SODIUM CHLORIDE 0.9 % IV SOLN
510.0000 mg | Freq: Once | INTRAVENOUS | Status: AC
  Administered 2017-09-22: 510 mg via INTRAVENOUS
  Filled 2017-09-22: qty 17

## 2017-09-22 MED ORDER — SODIUM CHLORIDE 0.9 % IV SOLN
Freq: Once | INTRAVENOUS | Status: AC
  Administered 2017-09-22: 15:00:00 via INTRAVENOUS
  Filled 2017-09-22: qty 1000

## 2017-09-22 NOTE — Assessment & Plan Note (Addendum)
#  Iron deficiency-second malabsorption; saturation 23%; normal hemoglobin- however patient symptomatic with symptoms of iron deficiency [patient feels symptomatically improved post infusion].  Proceed with IV Feraheme today.  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  New prescription given.  # Atypical cardiac symptoms-question fatigue/intermittent chest pain shortness of breath on exertion - defer to PCP for further evaluation/recommendations.   #  Patient follow-up with me in 6 months with IV Feraheme/ labs prior.   Cc; Dr.K

## 2017-09-22 NOTE — Progress Notes (Signed)
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  female  With above history of gastric bypass- and consequent malabsorption of iron B12 is here for follow-up.  Patient complains of fatigue.  She complains of palpitations.  She also complains of shortness of breath with exertion.  Had intermittent chest pains the last few days.  Not ongoing.  ROS: A complete 10 point review of system is done which is negative except mentioned above in history of present illness  MEDICAL HISTORY:  Past Medical History:  Diagnosis Date  . Anemia   . Anxiety   . Depression   . Hypertension   . Migraine    In past    SURGICAL HISTORY: Past Surgical History:  Procedure Laterality Date  . CHOLECYSTECTOMY    . COLONOSCOPY WITH PROPOFOL N/A 05/27/2017   Procedure: COLONOSCOPY WITH PROPOFOL;  Surgeon: Lin Landsman, MD;  Location: [facility] ENDOSCOPY;  Service: Gastroenterology;  Laterality: N/A;  . DILATION AND CURETTAGE OF UTERUS  2003   2 Miscarriages  . DILITATION & CURRETTAGE/HYSTROSCOPY WITH NOVASURE ABLATION N/A 12/16/2015   Procedure: DILATATION & CURETTAGE/HYSTEROSCOPY WITH NOVASURE ABLATION;  Surgeon: Brayton Mars, MD;  Location: [facility] ORS;  Service: Gynecology;  Laterality: N/A;  . GALLBLADDER SURGERY  2003  . GASTRIC BYPASS  2004  . GASTRIC BYPASS    . HERNIA REPAIR  2005  . HERNIA REPAIR      SOCIAL HISTORY: Social History   Socioeconomic History  . Marital status: Married    Spouse name: Not on file  . Number of children: Not on file  . Years of education: Not on file  . Highest education level: Not on file  Occupational History  . Not on file   Social Needs  . Financial resource strain: Not on file  . Food insecurity:    Worry: Not on file    Inability: Not on file  . Transportation needs:    Medical: Not on file    Non-medical: Not on file  Tobacco Use  . Smoking status: Never Smoker  . Smokeless tobacco: Never Used  Substance and Sexual Activity  . Alcohol use: No  . Drug use: No  . Sexual activity: Yes    Comment: vasectomy  Lifestyle  . Physical activity:    Days per week: Not on file    Minutes per session: Not on file  . Stress: Not on file  Relationships  . Social connections:    Talks on phone: Not on file    Gets together: Not on file    Attends religious service: Not on file    Active member of club or organization: Not on file    Attends meetings of clubs or organizations: Not on file    Relationship status: Not on file  . Intimate partner violence:    Fear of current or ex partner: Not on file    Emotionally abused: Not on file    Physically abused: Not on file    Forced sexual activity: Not on file  Other Topics Concern  . Not on file  Social History Narrative   ** Merged History Encounter **  FAMILY HISTORY: Family History  Adopted: Yes  Family history unknown: Yes    ALLERGIES:  has No Known Allergies.  MEDICATIONS:  Current Outpatient Medications  Medication Sig Dispense Refill  . amLODipine (NORVASC) 10 MG tablet Take 1 tablet (10 mg total) by mouth daily. 90 tablet 3  . cyanocobalamin (,VITAMIN B-12,) 1000 MCG/ML injection Inject 1 mL (1,000 mcg total) into the muscle every 30 (thirty) days. 1 mL 11  . escitalopram (LEXAPRO) 10 MG tablet Take 1 tablet (10 mg total) by mouth daily. May increase to 20mg  after 4 weeks 30 tablet 2  . hydrOXYzine (ATARAX/VISTARIL) 25 MG tablet Take 1-2 tablets (25-50 mg total) by mouth 2 (two) times daily as needed for anxiety (insomnia). 30 tablet 1   No current facility-administered medications for this visit.       Marland Kitchen  PHYSICAL  EXAMINATION:   Vitals:   09/22/17 1405 09/22/17 1412  BP:  124/86  Pulse:  66  Resp: 12   Temp:  (!) 97.4 F (36.3 C)   Filed Weights   09/22/17 1405  Weight: 189 lb (85.7 kg)   GENERAL: Well-nourished well-developed; Alert, no distress and comfortable.  Accompanied by family.  EYES: no pallor or icterus OROPHARYNX: no thrush or ulceration; NECK: supple; no lymph nodes felt. LYMPH:  no palpable lymphadenopathy in the axillary or inguinal regions LUNGS: Decreased breath sounds auscultation bilaterally. No wheeze or crackles HEART/CVS: regular rate & rhythm and no murmurs; No lower extremity edema ABDOMEN:abdomen soft, non-tender and normal bowel sounds. No hepatomegaly or splenomegaly.  Musculoskeletal:no cyanosis of digits and no clubbing  PSYCH: alert & oriented x 3 with fluent speech NEURO: no focal motor/sensory deficits SKIN:  no rashes or significant lesions  LABORATORY DATA:  I have reviewed the data as listed Lab Results  Component Value Date   WBC 4.5 09/20/2017   HGB 14.2 09/20/2017   HCT 40.5 09/20/2017   MCV 92.9 09/20/2017   PLT 182 09/20/2017   Recent Labs    03/22/17 1534 06/21/17 1413 09/20/17 1112  NA 136 137 136  K 4.0 4.0 4.5  CL 106 108 109  CO2 21* 23 21*  GLUCOSE 92 90 80  BUN 15 10 14   CREATININE 0.57 0.61 0.54  CALCIUM 8.8* 9.0 8.6*  GFRNONAA >60 >60 >60  GFRAA >60 >60 >60  PROT 7.6 7.7 6.5  ALBUMIN 4.3 4.5 3.9  AST 21 27 24   ALT 15 18 17   ALKPHOS 65 77 58  BILITOT 0.5 0.9 0.5      ASSESSMENT & PLAN:   Iron malabsorption #Iron deficiency-second malabsorption; saturation 23%; normal hemoglobin- however patient symptomatic with symptoms of iron deficiency [patient feels symptomatically improved post infusion].  Proceed with IV Feraheme today.  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  New prescription given.  # Atypical cardiac symptoms-question fatigue/intermittent chest pain shortness of breath on exertion -  defer to PCP for further evaluation/recommendations.   #  Patient follow-up with me in 6 months with IV Feraheme/ labs prior.   Cc; Dr.K .     Cammie Sickle, MD 09/22/2017 10:09 PM

## 2017-09-22 NOTE — Progress Notes (Signed)
Patient states she has been feeling tired, irritable, heart is racing and she is dyspnic on excertion.

## 2017-09-23 ENCOUNTER — Ambulatory Visit (INDEPENDENT_AMBULATORY_CARE_PROVIDER_SITE_OTHER): Payer: Managed Care, Other (non HMO) | Admitting: Family Medicine

## 2017-09-23 ENCOUNTER — Encounter: Payer: Self-pay | Admitting: Family Medicine

## 2017-09-23 VITALS — BP 109/68 | HR 55 | Temp 98.3°F | Resp 16 | Ht 64.0 in | Wt 189.0 lb

## 2017-09-23 DIAGNOSIS — F99 Mental disorder, not otherwise specified: Secondary | ICD-10-CM

## 2017-09-23 DIAGNOSIS — Z9884 Bariatric surgery status: Secondary | ICD-10-CM | POA: Diagnosis not present

## 2017-09-23 DIAGNOSIS — E0590 Thyrotoxicosis, unspecified without thyrotoxic crisis or storm: Secondary | ICD-10-CM | POA: Diagnosis not present

## 2017-09-23 DIAGNOSIS — I10 Essential (primary) hypertension: Secondary | ICD-10-CM | POA: Diagnosis not present

## 2017-09-23 DIAGNOSIS — F32A Depression, unspecified: Secondary | ICD-10-CM

## 2017-09-23 DIAGNOSIS — F419 Anxiety disorder, unspecified: Secondary | ICD-10-CM

## 2017-09-23 DIAGNOSIS — R7309 Other abnormal glucose: Secondary | ICD-10-CM | POA: Diagnosis not present

## 2017-09-23 DIAGNOSIS — Z0000 Encounter for general adult medical examination without abnormal findings: Secondary | ICD-10-CM

## 2017-09-23 DIAGNOSIS — K909 Intestinal malabsorption, unspecified: Secondary | ICD-10-CM | POA: Diagnosis not present

## 2017-09-23 DIAGNOSIS — R7989 Other specified abnormal findings of blood chemistry: Secondary | ICD-10-CM | POA: Diagnosis not present

## 2017-09-23 DIAGNOSIS — Z1231 Encounter for screening mammogram for malignant neoplasm of breast: Secondary | ICD-10-CM

## 2017-09-23 DIAGNOSIS — Z1239 Encounter for other screening for malignant neoplasm of breast: Secondary | ICD-10-CM

## 2017-09-23 DIAGNOSIS — G43109 Migraine with aura, not intractable, without status migrainosus: Secondary | ICD-10-CM

## 2017-09-23 DIAGNOSIS — F5105 Insomnia due to other mental disorder: Secondary | ICD-10-CM

## 2017-09-23 DIAGNOSIS — F329 Major depressive disorder, single episode, unspecified: Secondary | ICD-10-CM

## 2017-09-23 DIAGNOSIS — D239 Other benign neoplasm of skin, unspecified: Secondary | ICD-10-CM

## 2017-09-23 DIAGNOSIS — E559 Vitamin D deficiency, unspecified: Secondary | ICD-10-CM | POA: Diagnosis not present

## 2017-09-23 DIAGNOSIS — E538 Deficiency of other specified B group vitamins: Secondary | ICD-10-CM

## 2017-09-23 MED ORDER — AMLODIPINE BESYLATE 2.5 MG PO TABS
2.5000 mg | ORAL_TABLET | Freq: Every day | ORAL | 5 refills | Status: DC
Start: 2017-09-23 — End: 2018-11-22

## 2017-09-23 MED ORDER — ESCITALOPRAM OXALATE 10 MG PO TABS: 10.0000 mg | ORAL_TABLET | Freq: Every day | ORAL | 3 refills | Status: DC

## 2017-09-23 MED ORDER — HYDROXYZINE HCL 25 MG PO TABS: 25.0000 mg | ORAL_TABLET | Freq: Two times a day (BID) | ORAL | 3 refills | Status: DC | PRN

## 2017-09-23 NOTE — Assessment & Plan Note (Signed)
Significantly improved anxiety, mood and insomnia now on SSRI lexapro No further panic attacks Improved life stressors Improved GAD/PHQ  Refilled Lexapro continue 10mg  indefinitely for now >6-12 months Continue Hydroxyzine PRN

## 2017-09-23 NOTE — Assessment & Plan Note (Signed)
Normal BP off anti HTN med, presumed tendency for elevated BP due to stress/anxiety - Home readings improved - No complications  Plan: 1. REDUCE dose Amlodipine down to 2.5mg  daily for BP and to help reduce migraine as well - prophylaxis if decided to stop in future that is fine 2. Continue monitor BP outside office 3. Repeat Labs including TSH

## 2017-09-23 NOTE — Assessment & Plan Note (Signed)
Secondary to GI absorption s/p gastric surgery On IV iron per [facility] Follow up

## 2017-09-23 NOTE — Assessment & Plan Note (Signed)
Prior deficient Vitamin D Due to check On vitamin D therapy in past

## 2017-09-23 NOTE — Assessment & Plan Note (Signed)
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

## 2017-09-23 NOTE — Assessment & Plan Note (Signed)
Significantly improved on SSRI lexapro Secondary to anxiety Continue Lexapro 10mg  daily, refilled, and Hydroxyzine PRN using less often

## 2017-09-23 NOTE — Assessment & Plan Note (Addendum)
Prior low TSH in 2017 Overdue Possible subclinical hyperthyroidism Check lab with TSH Free T4

## 2017-09-23 NOTE — Patient Instructions (Addendum)
Thank you for coming to the office today.  Lowered dose Amlodipine from 10 down to 2.5mg  - goal to keep BP stable, even if stressed or slightly elevated - can also help migraine - If decide don't need it, that is fine  Refilled Lexapro 10mg  - 90- day continue this for while, refilled hydroxyzine  For Mammogram screening for breast cancer   Call the [facility] below anytime to schedule your own appointment now that order has been placed.  [facility] [place], [place] [address] Phone: [phone]  Labs today - Vitamin D, B12, A1c sugar (3 month), Thyroid [facility]  DERMATOLOGY  Referral  [facility]   [address] [place], [place] [address] Hours: 8AM-5PM Phone: [phone]  Sarina Ser, MD Brendolyn Patty, MD  Please schedule a Follow-up Appointment to: Return in about 6 months (around 03/25/2018) for Mood/Anxiety PHQ/GAD, med adjust, HTN check med.  If you have any other questions or concerns, please feel free to call the office or send a message through [place]. You may also schedule an earlier appointment if necessary.  Additionally, you may be receiving a survey about your experience at our office within a few days to 1 week by e-mail or mail. We value your feedback.  [name], DO [place]

## 2017-09-23 NOTE — Assessment & Plan Note (Signed)
Past history of low TSH Check TSH, Free T4

## 2017-09-23 NOTE — Progress Notes (Addendum)
Subjective:    Patient ID: Crystal Levine, female    DOB: December 03, 1965, 52 y.o.   MRN: [id number]  Crystal Levine is a 52 y.o. female presenting on 09/23/2017 for Annual Exam; Hypertension; Migraine; Hyperglycemia; and Anxiety   HPI   Here for Annual Physical and Lab Review. She had recent biometric labs done through work about March 2019, with fasting lipid panel and glucose.  Elevated Glucose / Hyperglycemia Reported reading of abnormal fasting on work biometric. Asking for repeat glucose check here with labs. No significant history of hyperglycemia in past but had one elevated reading few years ago, no recent A1c Diet is balanced, not following low carb / low sugar  Migraines, chronic Recent improvement, only 2-3 in past few months.  Off Amlodipine 10mg  daily, which was for BP and for prevention.  CHRONIC HTN: Reports she has stopped taking Amlodipine regularly for past >3+ months, she checks BP at home 120/80s avg, very rarely 140/90 due to stress, sometimes lowest 100/70s Current Meds - None Today not endorsing significant exertional symptoms  Anxiety with Depression and Panic Attacks / Insomnia Follow-up since last visit 09/01/17, started on Lexapro 10mg  daily due to significant life stressors, since starting SSRI, she has dramatically improved. See ROS scores GAD7 / PHQ9, also insomnia much improved sleeping better now. Occasionally taking Hydroxyzyine for prevention but otherwise not taking. - No further panic attacks since last visit. - Work situation improving  Cerumen Impaction In past she used to go to ENT and used to have ears cleaned. Has some poor reduced hearing overall. Requesting check for ear wax. Denies ear pain  Abnormal Lesion on Back Reports chronic raised skin tag or lesion on middle of upper back. Occasionally it is inflamed with clothing. Asking for referral to Dermatology for removal.  Health Maintenance:  UTD on Pap Smear followed by GYN  UTD  Colonscopy 05/2017  Updated HM - negative HIV done 12/11/15  Breast CA Screening: Due for mammogram screening - request order for [facility] - will complete form to fax records request. Last mammogram result 4 years was reported as negative done at [facility] [place]. No prior history abnormal mammogram. No known family history of breast cancer. Currently asymptomatic.   Depression screen [facility] 2/9 09/23/2017 09/01/2017 08/24/2016  Decreased Interest 0 0 0  Down, Depressed, Hopeless 0 1 0  PHQ - 2 Score 0 1 0  Altered sleeping 1 3 -  Tired, decreased energy 1 2 -  Change in appetite 2 2 -  Feeling bad or failure about yourself  0 1 -  Trouble concentrating 2 1 -  Moving slowly or fidgety/restless 0 1 -  Suicidal thoughts 0 0 -  PHQ-9 Score 6 11 -  Difficult doing work/chores Not difficult at all Somewhat difficult -   GAD 7 : Generalized Anxiety Score 09/23/2017 09/01/2017 09/04/2015 07/09/2015  Nervous, Anxious, on Edge 1 2 1 2   Control/stop worrying 1 1 1 3   Worry too much - different things 1 3 1 3   Trouble relaxing 1 2 2 3   Restless 0 2 0 2  Easily annoyed or irritable 1 1 0 1  Afraid - awful might happen 0 1 0 1  Total GAD 7 Score 5 12 5 15   Anxiety Difficulty Not difficult at all Somewhat difficult Not difficult at all Somewhat difficult    Past Medical History:  Diagnosis Date  . Anemia   . Anxiety   . Depression   . Hypertension   .  Migraine    In past   Past Surgical History:  Procedure Laterality Date  . CHOLECYSTECTOMY    . COLONOSCOPY WITH PROPOFOL N/A 05/27/2017   Procedure: COLONOSCOPY WITH PROPOFOL;  Surgeon: Lin Landsman, MD;  Location: [facility] ENDOSCOPY;  Service: Gastroenterology;  Laterality: N/A;  . DILATION AND CURETTAGE OF UTERUS  2003   2 Miscarriages  . DILITATION & CURRETTAGE/HYSTROSCOPY WITH NOVASURE ABLATION N/A 12/16/2015   Procedure: DILATATION & CURETTAGE/HYSTEROSCOPY WITH NOVASURE ABLATION;  Surgeon: Brayton Mars, MD;  Location: [facility]  ORS;  Service: Gynecology;  Laterality: N/A;  . GALLBLADDER SURGERY  2003  . GASTRIC BYPASS  2004  . GASTRIC BYPASS    . HERNIA REPAIR  2005  . HERNIA REPAIR     Social History   Socioeconomic History  . Marital status: Married    Spouse name: Not on file  . Number of children: Not on file  . Years of education: Not on file  . Highest education level: Not on file  Occupational History  . Not on file  Social Needs  . Financial resource strain: Not on file  . Food insecurity:    Worry: Not on file    Inability: Not on file  . Transportation needs:    Medical: Not on file    Non-medical: Not on file  Tobacco Use  . Smoking status: Never Smoker  . Smokeless tobacco: Never Used  Substance and Sexual Activity  . Alcohol use: No  . Drug use: No  . Sexual activity: Yes    Comment: vasectomy  Lifestyle  . Physical activity:    Days per week: Not on file    Minutes per session: Not on file  . Stress: Not on file  Relationships  . Social connections:    Talks on phone: Not on file    Gets together: Not on file    Attends religious service: Not on file    Active member of club or organization: Not on file    Attends meetings of clubs or organizations: Not on file    Relationship status: Not on file  . Intimate partner violence:    Fear of current or ex partner: Not on file    Emotionally abused: Not on file    Physically abused: Not on file    Forced sexual activity: Not on file  Other Topics Concern  . Not on file  Social History Narrative   ** Merged History Encounter **       Family History  Adopted: Yes  Family history unknown: Yes   Current Outpatient Medications on File Prior to Visit  Medication Sig  . cyanocobalamin (,VITAMIN B-12,) 1000 MCG/ML injection Inject 1 mL (1,000 mcg total) into the muscle every 30 (thirty) days.   No current facility-administered medications on file prior to visit.     Review of Systems  Constitutional: Negative for activity  change, appetite change, chills, diaphoresis, fatigue and fever.  HENT: Negative for congestion and hearing loss.   Eyes: Negative for visual disturbance.  Respiratory: Negative for apnea, cough, choking, chest tightness, shortness of breath and wheezing.   Cardiovascular: Negative for chest pain, palpitations and leg swelling.  Gastrointestinal: Negative for abdominal pain, anal bleeding, blood in stool, constipation, diarrhea, nausea and vomiting.  Endocrine: Negative for cold intolerance.  Genitourinary: Negative for difficulty urinating, dysuria, frequency, hematuria and menstrual problem.  Musculoskeletal: Negative for arthralgias, back pain and neck pain.  Skin: Negative for rash.  Allergic/Immunologic: Negative for environmental  allergies.  Neurological: Negative for dizziness, weakness, light-headedness, numbness and headaches.  Hematological: Negative for adenopathy.  Psychiatric/Behavioral: Negative for behavioral problems, dysphoric mood and sleep disturbance (improved). The patient is not nervous/anxious (improved).    Per HPI unless specifically indicated above     Objective:    BP 109/68   Pulse (!) 55   Temp 98.3 F (36.8 C) (Oral)   Resp 16   Ht 5\' 4"  (1.626 m)   Wt 189 lb (85.7 kg)   BMI 32.44 kg/m   Wt Readings from Last 3 Encounters:  09/23/17 189 lb (85.7 kg)  09/22/17 189 lb (85.7 kg)  09/01/17 180 lb (81.6 kg)    Physical Exam  Constitutional: She is oriented to person, place, and time. She appears well-developed and well-nourished. No distress.  Well-appearing, comfortable, cooperative  HENT:  Head: Normocephalic and atraumatic.  Mouth/Throat: Oropharynx is clear and moist.  Frontal / maxillary sinuses non-tender. Nares patent without purulence or edema. Bilateral TMs clear without erythema, effusion or bulging - only residual non impacting cerumen. Oropharynx clear without erythema, exudates, edema or asymmetry.  Eyes: Pupils are equal, round, and  reactive to light. Conjunctivae and EOM are normal. Right eye exhibits no discharge. Left eye exhibits no discharge.  Neck: Normal range of motion. Neck supple. No thyromegaly present.  Cardiovascular: Regular rhythm, normal heart sounds and intact distal pulses.  No murmur heard. Mild bradycardic  Pulmonary/Chest: Effort normal and breath sounds normal. No respiratory distress. She has no wheezes. She has no rales.  Abdominal: Soft. Bowel sounds are normal. She exhibits no distension and no mass. There is no tenderness.  Musculoskeletal: Normal range of motion. She exhibits no edema or tenderness.  Upper / Lower Extremities: - Normal muscle tone, strength bilateral upper extremities 5/5, lower extremities 5/5  Lymphadenopathy:    She has no cervical adenopathy.  Neurological: She is alert and oriented to person, place, and time.  Distal sensation intact to light touch all extremities  Skin: Skin is warm and dry. No rash noted. She is not diaphoretic. No erythema.  Abnormal neoplasm of upper mid back, appears skin tag or pedunculated mass, soft to touch, variable appearance.  Psychiatric: She has a normal mood and affect. Her behavior is normal.  Well groomed, good eye contact, normal speech and thoughts  Nursing note and vitals reviewed.      Results for orders placed or performed in visit on 09/23/17  HM HIV SCREENING LAB  Result Value Ref Range   HM HIV Screening Negative - Validated       Assessment & Plan:   Problem List Items Addressed This Visit    Anxiety and depression    Significantly improved anxiety, mood and insomnia now on SSRI lexapro No further panic attacks Improved life stressors Improved GAD/PHQ  Refilled Lexapro continue 10mg  indefinitely for now >6-12 months Continue Hydroxyzine PRN      Relevant Medications   escitalopram (LEXAPRO) 10 MG tablet   hydrOXYzine (ATARAX/VISTARIL) 25 MG tablet   Essential hypertension    Normal BP off anti HTN med,  presumed tendency for elevated BP due to stress/anxiety - Home readings improved - No complications  Plan: 1. REDUCE dose Amlodipine down to 2.5mg  daily for BP and to help reduce migraine as well - prophylaxis if decided to stop in future that is fine 2. Continue monitor BP outside office 3. 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 On IV iron per [facility] Follow up      Low TSH level    Past history of low TSH Check TSH, Free T4      Relevant Orders   TSH   T4, free   Migraine with aura and without status migrainosus, not intractable    Stable now improved migraine headaches No longer on CCB Amlodipine for prophylaxis and BP, doing well without increased frequency or severity Only x 3 in past few months RESTART lower dose Amlodipine to 2.5mg  daily Continue Abortive therapy PRN only with Sumatriptan, some NSAID for more mild Follow-up      Relevant Medications   amLODipine (NORVASC) 2.5 MG tablet   escitalopram (LEXAPRO) 10 MG tablet   S/P gastric bypass   Relevant Orders   TSH   Vitamin B12   VITAMIN D 25 Hydroxy (Vit-D Deficiency, Fractures)   Subclinical hyperthyroidism    Prior low TSH in 2017 Overdue Possible subclinical hyperthyroidism Check lab with TSH Free T4      Relevant Orders   TSH   T4, free   Vitamin B12 deficiency due to intestinal malabsorption    Vitamin B12 deficiency due to gastric sleeve bariatric surgery Due for lab check Followed by [facility] for B12 therapy      Relevant Orders   Vitamin B12   Vitamin D deficiency    Prior deficient Vitamin D Due to  check On vitamin D therapy in past      Relevant Orders   VITAMIN D 25 Hydroxy (Vit-D Deficiency, Fractures)    Other Visit Diagnoses    Annual physical exam    -  Primary Updated health maintenance Ordered additional lab results Encourage improve lifestyle diet    Abnormal glucose    Check A1c, with history of hyperglycemia     Relevant Orders   Hemoglobin A1c   Screening for breast cancer    Records request form completed for [facility] radiology to be faxed Ordered Patient to schedule screening mammo     Relevant Orders   MM DIGITAL SCREENING BILATERAL   Other benign neoplasm of skin, unspecified    Reassurance seems to be benign lesion Will refer to Derm for removal and evaluation     Relevant Orders   Ambulatory referral to Dermatology      Meds ordered this encounter  Medications  . amLODipine (NORVASC) 2.5 MG tablet    Sig: Take 1 tablet (2.5 mg total) by mouth daily.    Dispense:  30 tablet    Refill:  5  . escitalopram (LEXAPRO) 10 MG tablet    Sig: Take 1 tablet (10 mg total) by mouth daily.    Dispense:  90 tablet    Refill:  3  . hydrOXYzine (ATARAX/VISTARIL) 25 MG tablet    Sig: Take 1-2 tablets (25-50 mg total) by mouth 2 (two) times daily as needed for anxiety (insomnia).    Dispense:  30 tablet    Refill:  3   Orders Placed This Encounter  Procedures  . MM DIGITAL SCREENING BILATERAL    Standing Status:   Future    Standing Expiration Date:  11/24/2018    Order Specific Question:   Reason for Exam (SYMPTOM  OR DIAGNOSIS REQUIRED)    Answer:   Routine screening bilateral mammogram. May change to Bilateral MM Tomo screening if indicated and appropriate for patient/insurance    Order Specific Question:   Preferred imaging location?    Answer:   [facility]  . Hemoglobin A1c  . TSH  . T4, free  . Vitamin B12  . VITAMIN D 25 Hydroxy (Vit-D Deficiency, Fractures)  . HM HIV SCREENING LAB    This external order was created through the Results  Console.  . Ambulatory referral to Dermatology    Referral Priority:   Routine    Referral Type:   Consultation    Referral Reason:   Specialty Services Required    Requested Specialty:   Dermatology    Number of Visits Requested:   1    Follow up plan: Return in about 6 months (around 03/25/2018) for Mood/Anxiety PHQ/GAD, med adjust, HTN check med.  [name], [facility] 09/23/2017, 6:44 PM

## 2017-09-23 NOTE — Assessment & Plan Note (Addendum)
Stable now improved migraine headaches No longer on CCB Amlodipine for prophylaxis and BP, doing well without increased frequency or severity Only x 3 in past few months RESTART lower dose Amlodipine to 2.5mg  daily Continue Abortive therapy PRN only with Sumatriptan, some NSAID for more mild Follow-up

## 2017-09-24 LAB — T4, FREE: Free T4: 1 ng/dL (ref 0.8–1.8)

## 2017-09-24 LAB — TSH: TSH: 0.5 m[IU]/L

## 2017-09-24 LAB — HEMOGLOBIN A1C
Hgb A1c MFr Bld: 4.5 % of total Hgb (ref <5.7)
Mean Plasma Glucose: 82 (calc)
eAG (mmol/L): 4.6 (calc)

## 2017-09-24 LAB — VITAMIN D 25 HYDROXY (VIT D DEFICIENCY, FRACTURES): VIT D 25 HYDROXY: 16 ng/mL — AB (ref 30–100)

## 2017-09-24 LAB — VITAMIN B12: Vitamin B-12: 265 pg/mL (ref 200–1100)

## 2017-10-04 ENCOUNTER — Inpatient Hospital Stay
Admission: RE | Admit: 2017-10-04 | Discharge: 2017-10-04 | Disposition: A | Discharge status: Home / Self Care | Payer: Self-pay | Source: Ambulatory Visit | Attending: *Deleted | Admitting: *Deleted

## 2017-10-04 ENCOUNTER — Other Ambulatory Visit: Payer: Self-pay | Admitting: *Deleted

## 2017-10-04 ENCOUNTER — Ambulatory Visit: Payer: Managed Care, Other (non HMO) | Admitting: Family Medicine

## 2017-10-04 DIAGNOSIS — Z9289 Personal history of other medical treatment: Secondary | ICD-10-CM

## 2017-10-13 ENCOUNTER — Encounter: Payer: Self-pay | Admitting: Gastroenterology

## 2017-11-10 ENCOUNTER — Encounter: Payer: Managed Care, Other (non HMO) | Admitting: Obstetrics and Gynecology

## 2017-11-24 ENCOUNTER — Ambulatory Visit: Payer: Self-pay | Admitting: Family Medicine

## 2017-11-24 VITALS — BP 133/75 | HR 67 | Temp 97.9°F | Resp 14

## 2017-11-24 DIAGNOSIS — Z299 Encounter for prophylactic measures, unspecified: Secondary | ICD-10-CM

## 2017-11-24 DIAGNOSIS — T23261A Burn of second degree of back of right hand, initial encounter: Secondary | ICD-10-CM

## 2017-11-24 MED ORDER — TRAMADOL HCL 50 MG PO TABS: 50.0000 mg | ORAL_TABLET | Freq: Two times a day (BID) | ORAL | 0 refills | Status: AC | PRN
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
## 2018-02-18 ENCOUNTER — Encounter: Payer: Self-pay | Admitting: Family Medicine

## 2018-02-18 ENCOUNTER — Ambulatory Visit (INDEPENDENT_AMBULATORY_CARE_PROVIDER_SITE_OTHER): Payer: Managed Care, Other (non HMO) | Admitting: Family Medicine

## 2018-02-18 VITALS — BP 129/81 | HR 70 | Temp 98.3°F | Resp 16 | Ht 64.0 in | Wt 189.0 lb

## 2018-02-18 DIAGNOSIS — E538 Deficiency of other specified B group vitamins: Secondary | ICD-10-CM | POA: Diagnosis not present

## 2018-02-18 DIAGNOSIS — D508 Other iron deficiency anemias: Secondary | ICD-10-CM

## 2018-02-18 DIAGNOSIS — K909 Intestinal malabsorption, unspecified: Secondary | ICD-10-CM

## 2018-02-18 DIAGNOSIS — R531 Weakness: Secondary | ICD-10-CM

## 2018-02-18 DIAGNOSIS — R42 Dizziness and giddiness: Secondary | ICD-10-CM

## 2018-02-18 LAB — CBC WITH DIFFERENTIAL/PLATELET
BASOS ABS: 20 {cells}/uL (ref 0–200)
Basophils Relative: 0.5 %
Eosinophils Absolute: 140 cells/uL (ref 15–500)
Eosinophils Relative: 3.6 %
HEMATOCRIT: 44 % (ref 35.0–45.0)
Hemoglobin: 15 g/dL (ref 11.7–15.5)
LYMPHS ABS: 1232 {cells}/uL (ref 850–3900)
MCH: 31.8 pg (ref 27.0–33.0)
MCHC: 34.1 g/dL (ref 32.0–36.0)
MCV: 93.2 fL (ref 80.0–100.0)
MPV: 11.1 fL (ref 7.5–12.5)
Monocytes Relative: 7.4 %
NEUTROS PCT: 56.9 %
Neutro Abs: 2219 cells/uL (ref 1500–7800)
Platelets: 177 10*3/uL (ref 140–400)
RBC: 4.72 10*6/uL (ref 3.80–5.10)
RDW: 11.8 % (ref 11.0–15.0)
Total Lymphocyte: 31.6 %
WBC: 3.9 10*3/uL (ref 3.8–10.8)
WBCMIX: 289 {cells}/uL (ref 200–950)

## 2018-02-18 LAB — COMPLETE METABOLIC PANEL WITH GFR
AG RATIO: 2.1 (calc) (ref 1.0–2.5)
ALKALINE PHOSPHATASE (APISO): 62 U/L (ref 33–130)
ALT: 16 U/L (ref 6–29)
AST: 19 U/L (ref 10–35)
Albumin: 4.5 g/dL (ref 3.6–5.1)
BILIRUBIN TOTAL: 0.6 mg/dL (ref 0.2–1.2)
BUN: 14 mg/dL (ref 7–25)
CO2: 26 mmol/L (ref 20–32)
Calcium: 9 mg/dL (ref 8.6–10.4)
Chloride: 106 mmol/L (ref 98–110)
Creat: 0.67 mg/dL (ref 0.50–1.05)
GFR, EST NON AFRICAN AMERICAN: 102 mL/min/{1.73_m2} (ref >=60)
GFR, Est African American: 119 mL/min/{1.73_m2} (ref >=60)
GLOBULIN: 2.1 g/dL (ref 1.9–3.7)
Glucose, Bld: 79 mg/dL (ref 65–99)
POTASSIUM: 4.6 mmol/L (ref 3.5–5.3)
SODIUM: 139 mmol/L (ref 135–146)
Total Protein: 6.6 g/dL (ref 6.1–8.1)

## 2018-02-18 LAB — VITAMIN B12: Vitamin B-12: 1400 pg/mL — ABNORMAL HIGH (ref 200–1100)

## 2018-02-18 LAB — IRON,TIBC AND FERRITIN PANEL
%SAT: 34 % (calc) (ref 16–45)
Ferritin: 447 ng/mL — ABNORMAL HIGH (ref 16–232)
IRON: 101 ug/dL (ref 45–160)
TIBC: 296 mcg/dL (calc) (ref 250–450)

## 2018-02-18 MED ORDER — MECLIZINE HCL 25 MG PO TABS: 25.0000 mg | ORAL_TABLET | Freq: Three times a day (TID) | ORAL | 0 refills | Status: DC | PRN

## 2018-02-18 NOTE — Progress Notes (Addendum)
Subjective:    Patient ID: Crystal Levine, female    DOB: June 04, 1965, 52 y.o.   MRN: [id number]  Crystal Levine is a 52 y.o. female presenting on 02/18/2018 for Dizziness (feels weak also gets treatment for anemia and bodyache onset week)  Patient presents for a same day appointment. Accompanied by husband, [name], for additional history.   HPI   GENERALIZED WEAKNESS / MYALGIA / VERTIGO Reports symptoms started about 1 week ago with generalized weakness and brief episodes of acute vertigo type with dizziness, feels "swimmy headed" and generalized weak with associated constant muscle aching neck down everywhere, seems to be worsening. Tried some OTC Ibuprofen occasionally without relief.  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 Last infusion was 09/2017 completed and has been treated with Vitamin B12 therapy. Scheduled for December already with labs and repeat visit / infusion - She feels like some of her weakness symptoms feels like she did before with low iron  Normal BP readings home checked.  Stopped Escitalopram 10mg  about 2-3 weeks ago. DId not link this to symptoms. No new medications or other changes.  Known history of chronic recurrent headaches and migraines in past. Not similar to current situation. Has not seen Neurologist.  Admits some intermittent palpitations (in past with anemia) and has had some of these episodes  Denies any fevers, chills, sweats, nausea vomiting, abdominal pain, diarrhea, dyspnea or chest pain or tightness, no focal weakness in arm or leg or other area, no numbness or tingling   Health Maintenance: Due for Flu vaccine, defer today due to acute illness.  Depression screen [facility] 2/9 02/18/2018 09/23/2017 09/01/2017  Decreased Interest 0 0 0  Down, Depressed, Hopeless 0 0 1  PHQ - 2 Score 0 0 1    Altered sleeping - 1 3  Tired, decreased energy - 1 2  Change in appetite - 2 2  Feeling bad or failure about yourself  - 0 1  Trouble concentrating - 2 1  Moving slowly or fidgety/restless - 0 1  Suicidal thoughts - 0 0  PHQ-9 Score - 6 11  Difficult doing work/chores - Not difficult at all Somewhat difficult    Social History   Tobacco Use  . Smoking status: Never Smoker  . Smokeless tobacco: Never Used  Substance Use Topics  . Alcohol use: No  . Drug use: No    Review of Systems Per HPI unless specifically indicated above     Objective:    BP 129/81   Pulse 70   Temp 98.3 F (36.8 C) (Oral)   Resp 16   Ht 5\' 4"  (1.626 m)   Wt 189 lb (85.7 kg)   SpO2 99%   BMI 32.44 kg/m   Wt Readings from Last 3 Encounters:  02/18/18 189 lb (85.7 kg)  09/23/17 189 lb (85.7 kg)  09/22/17 189 lb (85.7 kg)    Physical Exam  Constitutional: She is oriented to person, place, and time. She appears well-developed and well-nourished. No distress.  Slightly tired and uncomfortable appearing, cooperative  HENT:  Head: Normocephalic and atraumatic.  Mouth/Throat: Oropharynx is clear and moist.  Frontal / maxillary sinuses non-tender. Nares patent without purulence or edema. Bilateral TMs clear without erythema, effusion or bulging. Oropharynx clear without erythema, exudates, edema or asymmetry.  Dix-Hallpike Maneuver significantly positive on LEFT with  some reproduced vertigo symptoms upon laying down and sitting up. Declined R side testing. No obvious nystagmus - she could not keep eyes open  Eyes: Conjunctivae are normal. Right eye exhibits no discharge. Left eye exhibits no discharge.  Neck: Normal range of motion. Neck supple. No thyromegaly present.  Cardiovascular: Normal rate, regular rhythm, normal heart sounds and intact distal pulses.  No murmur heard. Pulmonary/Chest: Effort normal and breath sounds normal. No respiratory distress. She has no wheezes. She has no rales.   Musculoskeletal: Normal range of motion. She exhibits no edema.  Upper / Lower Extremities: - Normal muscle tone, strength bilateral upper extremities 5/5, lower extremities 5/5 - some minor weakness from effort due to discomfort but with effort actual strength is normal  - Normal Gait  Lymphadenopathy:    She has no cervical adenopathy.  Neurological: She is alert and oriented to person, place, and time. No cranial nerve deficit or sensory deficit. She exhibits normal muscle tone. Coordination normal.  Distal sensation to light touch intact both ext  Skin: Skin is warm and dry. No rash noted. She is not diaphoretic. No erythema.  Psychiatric: She has a normal mood and affect. Her behavior is normal.  Well groomed, good eye contact, normal speech and thoughts  Nursing note and vitals reviewed.  Results for orders placed or performed in visit on 09/23/17  Hemoglobin A1c  Result Value Ref Range   Hgb A1c MFr Bld 4.5 <5.7 % of total Hgb   Mean Plasma Glucose 82 (calc)   eAG (mmol/L) 4.6 (calc)  TSH  Result Value Ref Range   TSH 0.50 mIU/L  T4, free  Result Value Ref Range   Free T4 1.0 0.8 - 1.8 ng/dL  Vitamin B12  Result Value Ref Range   Vitamin B-12 265 200 - 1,100 pg/mL  VITAMIN D 25 Hydroxy (Vit-D Deficiency, Fractures)  Result Value Ref Range   Vit D, 25-Hydroxy 16 (L) 30 - 100 ng/mL  HM HIV SCREENING LAB  Result Value Ref Range   HM HIV Screening Negative - Validated       Assessment & Plan:   Problem List Items Addressed This Visit    Iron deficiency anemia - Primary   Relevant Orders   CBC with Differential/Platelet   Iron malabsorption   Relevant Orders   CBC with Differential/Platelet   Iron, TIBC and Ferritin Panel   Vitamin B12 deficiency due to intestinal malabsorption   Relevant Orders   Vitamin B12  Questionable if her constellation of symptoms is due to iron/nutritional deficiency and anemia, possible given weakness but does not seem to explain all  symptoms. She is nearly due for labs - agree to rush labs with STAT check on her iron studies / CBC as ordered including vitamin B12 - Will review STAT lab results and notify patient promptly if this is acute cause of her symptoms, she would be advised to go to hospital if severe anemia, if mild only then will have her follow up sooner w/ Hematology  Will forward message to Dr Rogue Bussing regarding already ordered all of his labs early now, and he may decide to defer or change his orders for December 2019.     Other Visit Diagnoses    Vertigo       Relevant Medications   meclizine (ANTIVERT) 25 MG tablet   Other Relevant Orders   COMPLETE METABOLIC PANEL WITH GFR   Generalized weakness       Relevant Orders   COMPLETE  METABOLIC PANEL WITH GFR  Clinically with constellation of symptoms, seems vertigo is one primary concern with episodes, seems provoked by position, as evidenced on exam.  - No other significant neurological findings or focal deficits - Concern still with generalized weak / ache pain symptoms  Plan: 1. Handout given with Epley maneuver TID for 1-2 weeks until resolved 2. Rx meclizine PRN for breakthrough symptoms 3. Return criteria, if not improved consider vestibular PT referral  Strict other return criteria if not improving or any severe refractory headache, migraine, focal neuro or other deficit then needs to go immediately to hospital ED for evaluation including imaging - if she gradually improves but has some lingering symptoms we may consider referral to Neuro as outpatient for 2nd opinion and outpatient imaging.       Meds ordered this encounter  Medications  . meclizine (ANTIVERT) 25 MG tablet    Sig: Take 1 tablet (25 mg total) by mouth 3 (three) times daily as needed for dizziness.    Dispense:  30 tablet    Refill:  0   Orders Placed This Encounter  Procedures  . CBC with Differential/Platelet  . COMPLETE METABOLIC PANEL WITH GFR  . Iron, TIBC and  Ferritin Panel  . Vitamin B12     Follow up plan: Return in about 2 weeks (around 03/04/2018), or if symptoms worsen or fail to improve, for vertigo weakness.  [name], [facility] 02/18/2018, 9:49 AM ---------------------------------------------  UPDATE 02/18/18 - 5pm approx, after review of STAT lab results listed below.  Attempted to call patient and her husband, and did not reach them, left voicemail w/ results. Released to mychart as well.  Reassuring labs, no anemia, does not show cause of symptoms. Forwarded to Dr Rogue Bussing as well - may not need labs now in December since completed.  Results for orders placed or performed in visit on 02/18/18 (from the past 24 hour(s))  CBC with Differential/Platelet     Status: None   Collection Time: 02/18/18 10:19 AM  Result Value Ref Range   WBC 3.9 3.8 - 10.8 Thousand/uL   RBC 4.72 3.80 - 5.10 Million/uL   Hemoglobin 15.0 11.7 - 15.5 g/dL   HCT 44.0 35.0 - 45.0 %   MCV 93.2 80.0 - 100.0 fL   MCH 31.8 27.0 - 33.0 pg   MCHC 34.1 32.0 - 36.0 g/dL   RDW 11.8 11.0 - 15.0 %   Platelets 177 140 - 400 Thousand/uL   MPV 11.1 7.5 - 12.5 fL   Neutro Abs 2,219 1,500 - 7,800 cells/uL   Lymphs Abs 1,232 850 - 3,900 cells/uL   WBC mixed population 289 200 - 950 cells/uL   Eosinophils Absolute 140 15 - 500 cells/uL   Basophils Absolute 20 0 - 200 cells/uL   Neutrophils Relative % 56.9 %   Total Lymphocyte 31.6 %   Monocytes Relative 7.4 %   Eosinophils Relative 3.6 %   Basophils Relative 0.5 %  COMPLETE METABOLIC PANEL WITH GFR     Status: None   Collection Time: 02/18/18 10:19 AM  Result Value Ref Range   Glucose, Bld 79 65 - 99 mg/dL   BUN 14 7 - 25 mg/dL   Creat 0.67 0.50 - 1.05 mg/dL   GFR, Est Non African American 102 > OR = 60 mL/min/1.22m2   GFR, Est African American 119 > OR = 60 mL/min/1.39m2   BUN/Creatinine Ratio NOT APPLICABLE 6 - 22 (calc)  Sodium 139 135 - 146  mmol/L   Potassium 4.6 3.5 - 5.3 mmol/L   Chloride 106 98 - 110 mmol/L   CO2 26 20 - 32 mmol/L   Calcium 9.0 8.6 - 10.4 mg/dL   Total Protein 6.6 6.1 - 8.1 g/dL   Albumin 4.5 3.6 - 5.1 g/dL   Globulin 2.1 1.9 - 3.7 g/dL (calc)   AG Ratio 2.1 1.0 - 2.5 (calc)   Total Bilirubin 0.6 0.2 - 1.2 mg/dL   Alkaline phosphatase (APISO) 62 33 - 130 U/L   AST 19 10 - 35 U/L   ALT 16 6 - 29 U/L  Iron, TIBC and Ferritin Panel     Status: Abnormal   Collection Time: 02/18/18 10:19 AM  Result Value Ref Range   Iron 101 45 - 160 mcg/dL   TIBC 296 250 - 450 mcg/dL (calc)   %SAT 34 16 - 45 % (calc)   Ferritin 447 (H) 16 - 232 ng/mL  Vitamin B12     Status: Abnormal   Collection Time: 02/18/18 10:19 AM  Result Value Ref Range   Vitamin B-12 1,400 (H) 200 - 1,100 pg/mL   [name], DO [facility] 02/18/2018, 5:12 PM

## 2018-02-18 NOTE — Patient Instructions (Addendum)
Thank you for coming to the office today.  STAT Blood today - all of the iron panels as well - and Vitamin B12 - will send to Dr Tish Men may not need upcoming lab draw.  If any symptoms concerning for severe worsening headache, passing out, focal numbness or weakness, stroke symptoms, confusion, or just NOT improving - may go to hospital ED for next evaluation and head imaging if indicated.  In future if gradually improving we can do more outpatient testing and referral to Neuro or ENT if needed  1. You have symptoms of Vertigo (Benign Paroxysmal Positional Vertigo) - This is commonly caused by inner ear fluid imbalance, sometimes can be worsened by allergies and sinus symptoms, otherwise it can occur randomly sometimes and we may never discover the exact cause. - To treat this, try the Epley Manuever (see diagrams/instructions below) at home up to 3 times a day for 1-2 weeks or until symptoms resolve - You may take Meclizine as needed up to 3 times a day for dizziness, this will not cure symptoms but may help. Caution may make you drowsy.  If you develop significant worsening episode with vertigo that does not improve and you get severe headache, loss of vision, arm or leg weakness, slurred speech, or other concerning symptoms please seek immediate medical attention at Emergency Department.  Please schedule a follow-up appointment with Dr Parks Ranger within 4 weeks if Vertigo not improving, and will consider Referral to Vestibular Rehab  See the next page for images describing the Epley Manuever.     ----------------------------------------------------------------------------------------------------------------------       Please schedule a Follow-up Appointment to: Return in about 2 weeks (around 03/04/2018), or if symptoms worsen or fail to improve, for vertigo weakness.  If you have any other questions or concerns, please feel free to call the office or send a message through  [facility]. You may also schedule an earlier appointment if necessary.  Additionally, you may be receiving a survey about your experience at our office within a few days to 1 week by e-mail or mail. We value your feedback.  [name], DO [place]

## 2018-03-21 ENCOUNTER — Inpatient Hospital Stay: Payer: Managed Care, Other (non HMO) | Attending: Internal Medicine

## 2018-03-23 ENCOUNTER — Inpatient Hospital Stay: Payer: Managed Care, Other (non HMO) | Admitting: Internal Medicine

## 2018-03-23 ENCOUNTER — Inpatient Hospital Stay: Payer: Managed Care, Other (non HMO)

## 2018-03-29 ENCOUNTER — Ambulatory Visit: Payer: Managed Care, Other (non HMO) | Admitting: Family Medicine

## 2018-03-29 ENCOUNTER — Encounter: Payer: Self-pay | Admitting: Gastroenterology

## 2018-04-12 ENCOUNTER — Other Ambulatory Visit: Payer: Self-pay | Admitting: Internal Medicine

## 2018-04-12 NOTE — Telephone Encounter (Signed)
Patient last prescribed B12 injection with 11 RFs on 09/22/2017 with the instructions 1 ml every month. Pt should still have RFs on file.

## 2018-04-12 NOTE — Telephone Encounter (Signed)
...     Ref Range & Units 50mo ago (02/18/18) 13mo ago (09/23/17) 86yr ago (02/21/16) 34yr ago (01/27/16) 69yr ago (11/15/15) 51yr ago (07/10/15)  Vitamin B-12 200 - 1,100 pg/mL 1,400High   265 CM 1,384High  R, CM 68Low  R, CM 183Low   107Low  R  Resulting [facility]      Specimen Collected: 02/18/18 10:19  Last Resulted: 02/18/18 16:17

## 2018-04-19 IMAGING — CR DG CHEST 2V
1 series · 2 of 2 positions shown · non-contrast
Comparison: None.

CLINICAL DATA: Mid chest pain for 1 week, worsening since last
night. History of hypertension.

EXAM:
CHEST  2 VIEW

[Series 1: dg chest 2 view · 0.14mm/px · 2 of 2 slices shown]
[im 1/2]
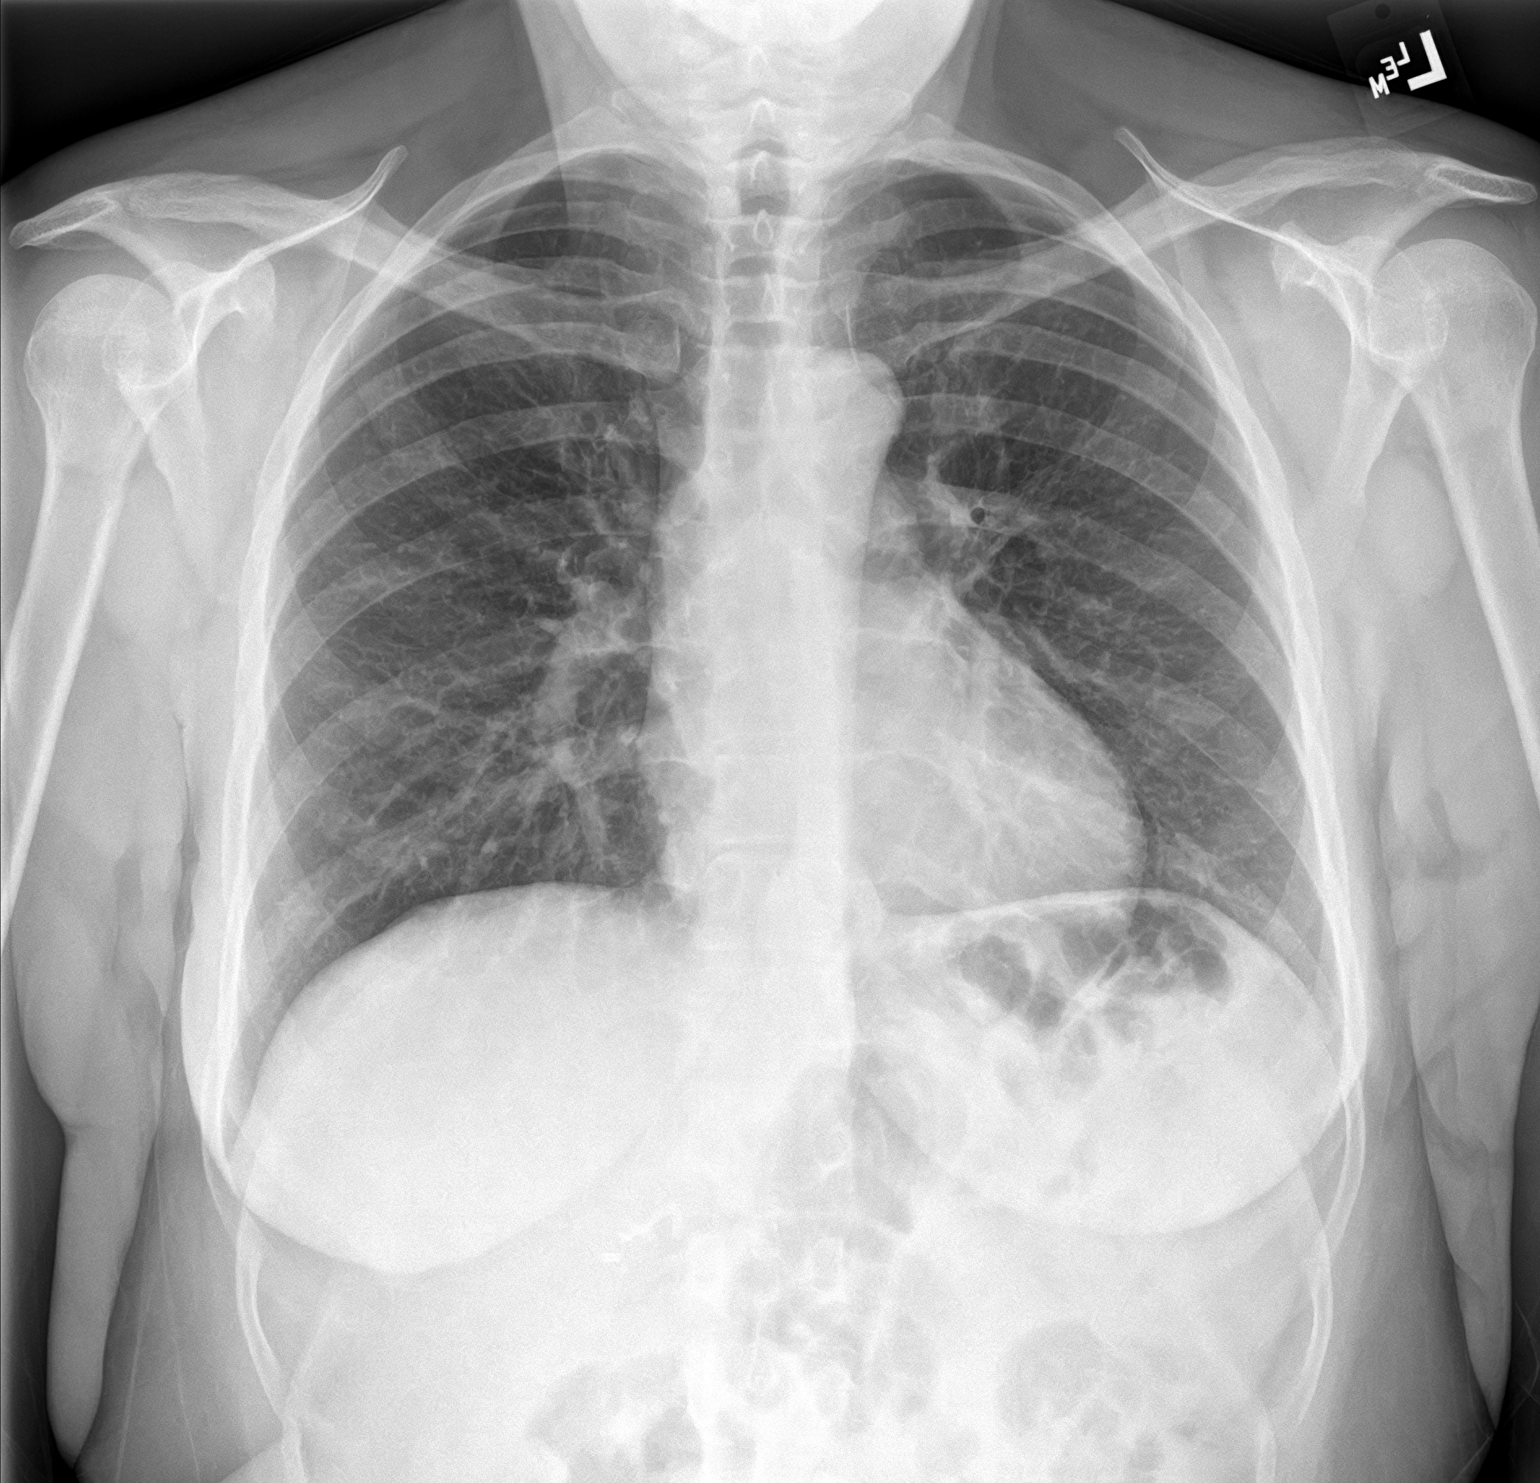
[im 2/2]
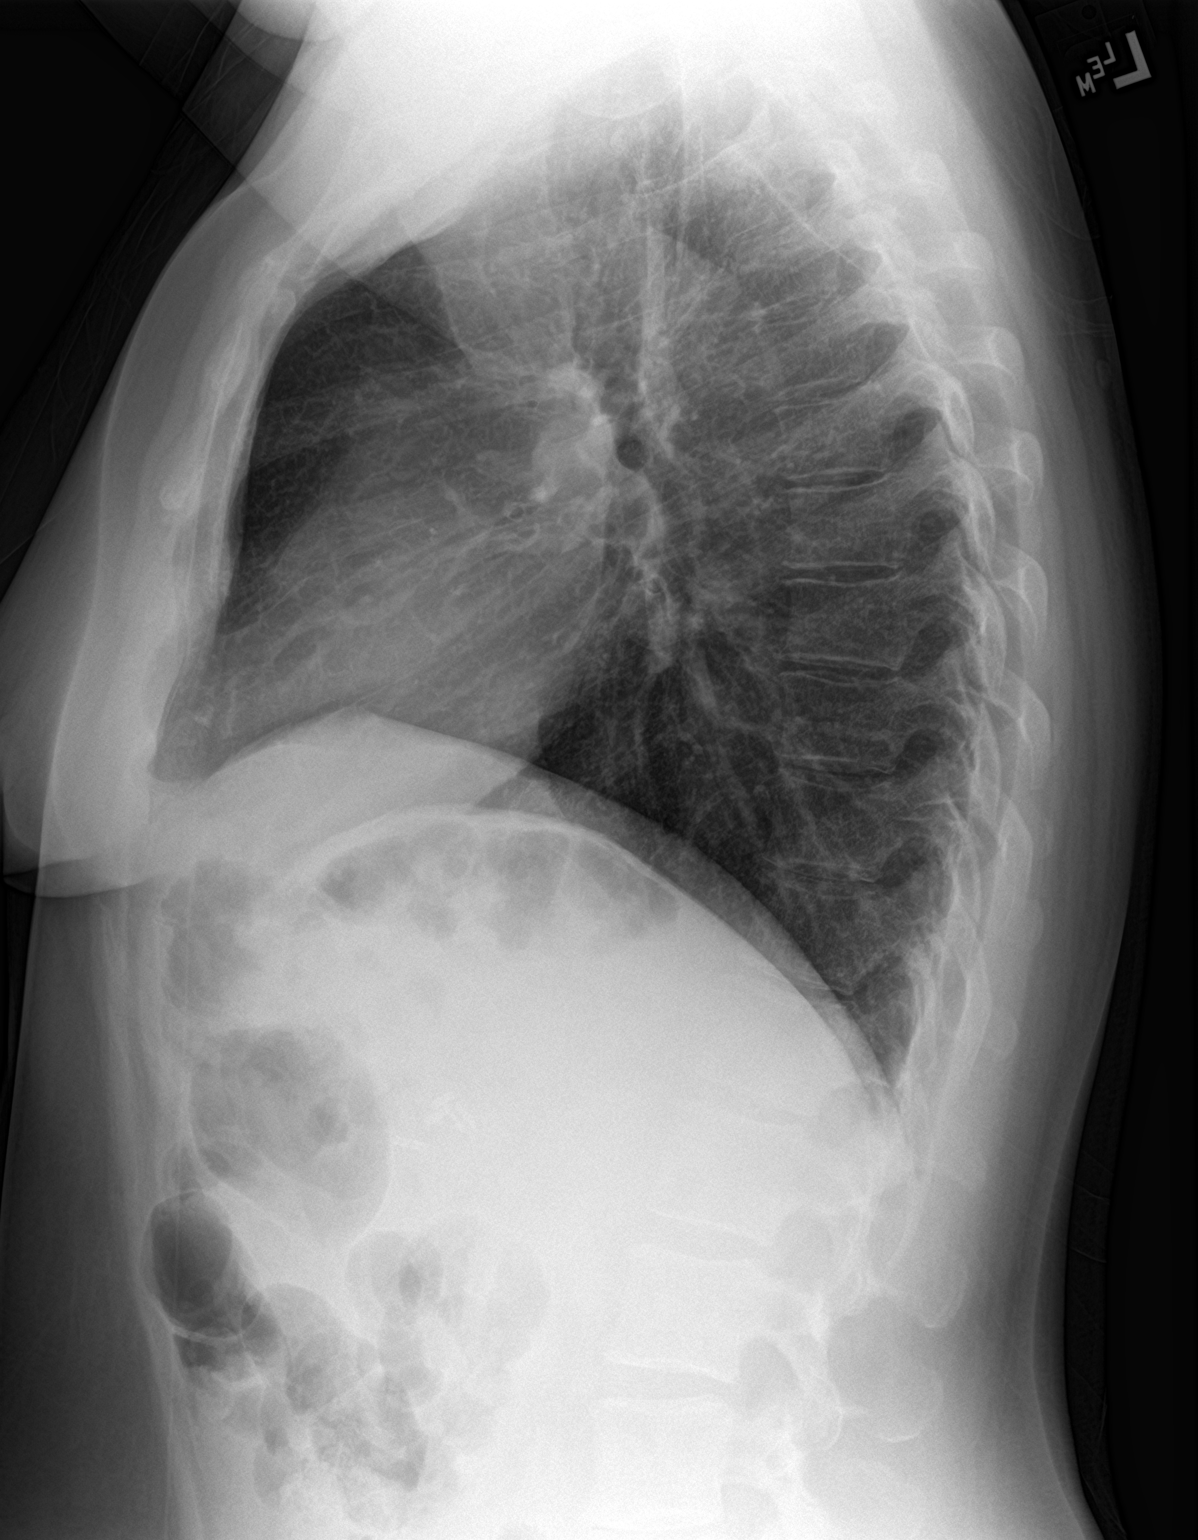

[2 of 2 positions shown; findings below may reference images not displayed]

FINDINGS: Cardiomediastinal silhouette is normal. No pleural effusions or
focal consolidations. Trachea projects midline and there is no
pneumothorax. Soft tissue planes and included osseous structures are
non-suspicious. Surgical clips in the included right abdomen
compatible with cholecystectomy.
IMPRESSION: Negative.

## 2018-05-06 ENCOUNTER — Inpatient Hospital Stay: Payer: Managed Care, Other (non HMO)

## 2018-05-06 ENCOUNTER — Inpatient Hospital Stay: Payer: Managed Care, Other (non HMO) | Admitting: Internal Medicine

## 2018-05-18 ENCOUNTER — Inpatient Hospital Stay: Payer: Managed Care, Other (non HMO) | Attending: Internal Medicine

## 2018-05-18 DIAGNOSIS — K909 Intestinal malabsorption, unspecified: Secondary | ICD-10-CM

## 2018-05-18 DIAGNOSIS — D509 Iron deficiency anemia, unspecified: Secondary | ICD-10-CM | POA: Insufficient documentation

## 2018-05-18 LAB — CBC WITH DIFFERENTIAL/PLATELET
Abs Immature Granulocytes: 0.01 10*3/uL (ref 0.00–0.07)
Basophils Absolute: 0 10*3/uL (ref 0.0–0.1)
Basophils Relative: 1 %
Eosinophils Absolute: 0.2 10*3/uL (ref 0.0–0.5)
Eosinophils Relative: 3 %
HCT: 42.2 % (ref 36.0–46.0)
Hemoglobin: 14.1 g/dL (ref 12.0–15.0)
Immature Granulocytes: 0 %
LYMPHS ABS: 1.7 10*3/uL (ref 0.7–4.0)
Lymphocytes Relative: 32 %
MCH: 30.9 pg (ref 26.0–34.0)
MCHC: 33.4 g/dL (ref 30.0–36.0)
MCV: 92.3 fL (ref 80.0–100.0)
Monocytes Absolute: 0.4 10*3/uL (ref 0.1–1.0)
Monocytes Relative: 8 %
Neutro Abs: 2.9 10*3/uL (ref 1.7–7.7)
Neutrophils Relative %: 56 %
Platelets: 191 10*3/uL (ref 150–400)
RBC: 4.57 MIL/uL (ref 3.87–5.11)
RDW: 12.4 % (ref 11.5–15.5)
WBC: 5.1 10*3/uL (ref 4.0–10.5)
nRBC: 0 % (ref 0.0–0.2)

## 2018-05-18 LAB — IRON AND TIBC
Iron: 49 ug/dL (ref 28–170)
Saturation Ratios: 16 % (ref 10.4–31.8)
TIBC: 301 ug/dL (ref 250–450)
UIBC: 252 ug/dL

## 2018-05-18 LAB — COMPREHENSIVE METABOLIC PANEL
ALK PHOS: 75 U/L (ref 38–126)
ALT: 19 U/L (ref 0–44)
AST: 22 U/L (ref 15–41)
Albumin: 4.1 g/dL (ref 3.5–5.0)
Anion gap: 5 (ref 5–15)
BUN: 13 mg/dL (ref 6–20)
CO2: 22 mmol/L (ref 22–32)
Calcium: 8.6 mg/dL — ABNORMAL LOW (ref 8.9–10.3)
Chloride: 108 mmol/L (ref 98–111)
Creatinine, Ser: 0.55 mg/dL (ref 0.44–1.00)
GFR calc Af Amer: >60 mL/min (ref >60)
GFR calc non Af Amer: >60 mL/min (ref >60)
Glucose, Bld: 115 mg/dL — ABNORMAL HIGH (ref 70–99)
Potassium: 3.6 mmol/L (ref 3.5–5.1)
Sodium: 135 mmol/L (ref 135–145)
TOTAL PROTEIN: 7 g/dL (ref 6.5–8.1)
Total Bilirubin: 0.6 mg/dL (ref 0.3–1.2)

## 2018-05-18 LAB — FERRITIN: Ferritin: 338 ng/mL — ABNORMAL HIGH (ref 11–307)

## 2018-05-23 ENCOUNTER — Encounter: Payer: Self-pay | Admitting: Internal Medicine

## 2018-05-23 ENCOUNTER — Other Ambulatory Visit: Payer: Self-pay

## 2018-05-23 ENCOUNTER — Inpatient Hospital Stay: Payer: Managed Care, Other (non HMO) | Attending: Internal Medicine | Admitting: Internal Medicine

## 2018-05-23 ENCOUNTER — Inpatient Hospital Stay: Payer: Managed Care, Other (non HMO)

## 2018-05-23 VITALS — BP 137/87 | HR 64 | Temp 97.6°F | Resp 20

## 2018-05-23 DIAGNOSIS — I10 Essential (primary) hypertension: Secondary | ICD-10-CM

## 2018-05-23 DIAGNOSIS — E538 Deficiency of other specified B group vitamins: Secondary | ICD-10-CM | POA: Insufficient documentation

## 2018-05-23 DIAGNOSIS — Z9884 Bariatric surgery status: Secondary | ICD-10-CM | POA: Diagnosis not present

## 2018-05-23 DIAGNOSIS — Z79899 Other long term (current) drug therapy: Secondary | ICD-10-CM | POA: Diagnosis not present

## 2018-05-23 DIAGNOSIS — K909 Intestinal malabsorption, unspecified: Secondary | ICD-10-CM

## 2018-05-23 DIAGNOSIS — D509 Iron deficiency anemia, unspecified: Secondary | ICD-10-CM | POA: Diagnosis present

## 2018-05-23 DIAGNOSIS — F329 Major depressive disorder, single episode, unspecified: Secondary | ICD-10-CM | POA: Diagnosis not present

## 2018-05-23 NOTE — Progress Notes (Signed)
[facility] NOTE  Patient Care Team: Olin Hauser, DO as PCP - General (Family Medicine) Parks Ranger Devonne Doughty, DO (Family Medicine)  CHIEF COMPLAINTS/PURPOSE OF CONSULTATION:   #  Iron Def sec to gastric bypass-   # B12 def on IM B12 injections  # GASTRIC BY PASS [2004]   HISTORY OF PRESENTING ILLNESS:  Crystal Levine 53 y.o.  female  With above history of gastric bypass- and consequent malabsorption of iron B12 is here for follow-up.  No fatigue.  Feels good.  No blood in stools no black or stools.  No chest pain or shortness of breath or cough.   Review of Systems  Constitutional: Negative for chills, diaphoresis, fever, malaise/fatigue and weight loss.  HENT: Negative for nosebleeds and sore throat.   Eyes: Negative for double vision.  Respiratory: Negative for cough, hemoptysis, sputum production, shortness of breath and wheezing.   Cardiovascular: Negative for chest pain, palpitations, orthopnea and leg swelling.  Gastrointestinal: Negative for abdominal pain, blood in stool, constipation, diarrhea, heartburn, melena, nausea and vomiting.  Genitourinary: Negative for dysuria, frequency and urgency.  Musculoskeletal: Negative for back pain and joint pain.  Skin: Negative.  Negative for itching and rash.  Neurological: Negative for dizziness, tingling, focal weakness, weakness and headaches.  Endo/Heme/Allergies: Does not bruise/bleed easily.  Psychiatric/Behavioral: Negative for depression. The patient is not nervous/anxious and does not have insomnia.      MEDICAL HISTORY:  Past Medical History:  Diagnosis Date  . Anemia   . Anxiety   . Depression   . Hypertension   . Migraine    In past    SURGICAL HISTORY: Past Surgical History:  Procedure Laterality Date  . CHOLECYSTECTOMY    . COLONOSCOPY WITH PROPOFOL N/A 05/27/2017   Procedure: COLONOSCOPY WITH PROPOFOL;  Surgeon: Lin Landsman, MD;  Location: [facility] ENDOSCOPY;   Service: Gastroenterology;  Laterality: N/A;  . DILATION AND CURETTAGE OF UTERUS  2003   2 Miscarriages  . DILITATION & CURRETTAGE/HYSTROSCOPY WITH NOVASURE ABLATION N/A 12/16/2015   Procedure: DILATATION & CURETTAGE/HYSTEROSCOPY WITH NOVASURE ABLATION;  Surgeon: Brayton Mars, MD;  Location: [facility] ORS;  Service: Gynecology;  Laterality: N/A;  . GALLBLADDER SURGERY  2003  . GASTRIC BYPASS  2004  . GASTRIC BYPASS    . HERNIA REPAIR  2005  . HERNIA REPAIR      SOCIAL HISTORY: Social History   Socioeconomic History  . Marital status: Married    Spouse name: Not on file  . Number of children: Not on file  . Years of education: Not on file  . Highest education level: Not on file  Occupational History  . Not on file  Social Needs  . Financial resource strain: Not on file  . Food insecurity:    Worry: Not on file    Inability: Not on file  . Transportation needs:    Medical: Not on file    Non-medical: Not on file  Tobacco Use  . Smoking status: Never Smoker  . Smokeless tobacco: Never Used  Substance and Sexual Activity  . Alcohol use: No  . Drug use: No  . Sexual activity: Yes    Comment: vasectomy  Lifestyle  . Physical activity:    Days per week: Not on file    Minutes per session: Not on file  . Stress: Not on file  Relationships  . Social connections:    Talks on phone: Not on file    Gets together: Not on  file    Attends religious service: Not on file    Active member of club or organization: Not on file    Attends meetings of clubs or organizations: Not on file    Relationship status: Not on file  . Intimate partner violence:    Fear of current or ex partner: Not on file    Emotionally abused: Not on file    Physically abused: Not on file    Forced sexual activity: Not on file  Other Topics Concern  . Not on file  Social History Narrative   ** Merged History Encounter **        FAMILY HISTORY: Family History  Adopted: Yes  Family history  unknown: Yes    ALLERGIES:  has No Known Allergies.  MEDICATIONS:  Current Outpatient Medications  Medication Sig Dispense Refill  . amLODipine (NORVASC) 2.5 MG tablet Take 1 tablet (2.5 mg total) by mouth daily. 30 tablet 5  . cyanocobalamin (,VITAMIN B-12,) 1000 MCG/ML injection Inject 1 mL (1,000 mcg total) into the muscle every 30 (thirty) days. 1 mL 11  . escitalopram (LEXAPRO) 10 MG tablet Take 1 tablet (10 mg total) by mouth daily. 90 tablet 3  . hydrOXYzine (ATARAX/VISTARIL) 25 MG tablet Take 1-2 tablets (25-50 mg total) by mouth 2 (two) times daily as needed for anxiety (insomnia). 30 tablet 3  . meclizine (ANTIVERT) 25 MG tablet Take 1 tablet (25 mg total) by mouth 3 (three) times daily as needed for dizziness. 30 tablet 0   No current facility-administered medications for this visit.       Marland Kitchen  PHYSICAL EXAMINATION:   Vitals:   05/23/18 1323  BP: 137/87  Pulse: 64  Resp: 20  Temp: 97.6 F (36.4 C)   There were no vitals filed for this visit. Physical Exam  Constitutional: She is oriented to person, place, and time and well-developed, well-nourished, and in no distress.  HENT:  Head: Normocephalic and atraumatic.  Mouth/Throat: Oropharynx is clear and moist. No oropharyngeal exudate.  Eyes: Pupils are equal, round, and reactive to light.  Neck: Normal range of motion. Neck supple.  Cardiovascular: Normal rate and regular rhythm.  Pulmonary/Chest: No respiratory distress. She has no wheezes.  Abdominal: Soft. Bowel sounds are normal. She exhibits no distension and no mass. There is no abdominal tenderness. There is no rebound and no guarding.  Musculoskeletal: Normal range of motion.        General: No tenderness or edema.  Neurological: She is alert and oriented to person, place, and time.  Skin: Skin is warm.  Psychiatric: Affect normal.     LABORATORY DATA:  I have reviewed the data as listed Lab Results  Component Value Date   WBC 5.1 05/18/2018   HGB  14.1 05/18/2018   HCT 42.2 05/18/2018   MCV 92.3 05/18/2018   PLT 191 05/18/2018   Recent Labs    06/21/17 1413 09/20/17 1112 02/18/18 1019 05/18/18 1536  NA 137 136 139 135  K 4.0 4.5 4.6 3.6  CL 108 109 106 108  CO2 23 21* 26 22  GLUCOSE 90 80 79 115*  BUN 10 14 14 13   CREATININE 0.61 0.54 0.67 0.55  CALCIUM 9.0 8.6* 9.0 8.6*  GFRNONAA >60 >60 102 >60  GFRAA >60 >60 119 >60  PROT 7.7 6.5 6.6 7.0  ALBUMIN 4.5 3.9  --  4.1  AST 27 24 19 22   ALT 18 17 16 19   ALKPHOS 77 58  --  75  BILITOT 0.9 0.5 0.6 0.6      ASSESSMENT & PLAN:   Iron malabsorption #Iron deficiency-second malabsorption; hemoglobin normal.  Iron studies adequate.  Patient asymptomatic.  Hold IV iron today.  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  Continue B12.  # DISPOSITION:  # NO infusion today # Follow-up with me in 6 months- MD labs-possible IV Feraheme/ labs prior- cbc/bmp/iron studies/ ferritin-Dr.B.   Cc; Dr.K .     Cammie Sickle, MD 05/25/2018 1:45 PM

## 2018-05-23 NOTE — Assessment & Plan Note (Addendum)
#  Iron deficiency-second malabsorption; hemoglobin normal.  Iron studies adequate.  Patient asymptomatic.  Hold IV iron today.  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  Continue B12.  # DISPOSITION:  # NO infusion today # Follow-up with me in 6 months- MD labs-possible IV Feraheme/ labs prior- cbc/bmp/iron studies/ ferritin-Dr.B.   Cc; Dr.K

## 2018-05-31 ENCOUNTER — Encounter: Payer: Self-pay | Admitting: Internal Medicine

## 2018-11-10 ENCOUNTER — Other Ambulatory Visit: Payer: Self-pay | Admitting: Internal Medicine

## 2018-11-17 ENCOUNTER — Other Ambulatory Visit: Payer: Self-pay

## 2018-11-18 ENCOUNTER — Inpatient Hospital Stay: Payer: Managed Care, Other (non HMO) | Attending: Internal Medicine

## 2018-11-21 ENCOUNTER — Inpatient Hospital Stay: Payer: Managed Care, Other (non HMO)

## 2018-11-21 ENCOUNTER — Inpatient Hospital Stay: Payer: Managed Care, Other (non HMO) | Admitting: Internal Medicine

## 2018-11-22 ENCOUNTER — Other Ambulatory Visit: Payer: Self-pay

## 2018-11-22 ENCOUNTER — Encounter: Payer: Self-pay | Admitting: Family Medicine

## 2018-11-22 ENCOUNTER — Ambulatory Visit (INDEPENDENT_AMBULATORY_CARE_PROVIDER_SITE_OTHER): Payer: Managed Care, Other (non HMO) | Admitting: Family Medicine

## 2018-11-22 VITALS — BP 128/94 | HR 65 | Temp 98.6°F | Resp 16 | Ht 64.0 in | Wt 191.0 lb

## 2018-11-22 DIAGNOSIS — G43109 Migraine with aura, not intractable, without status migrainosus: Secondary | ICD-10-CM | POA: Diagnosis not present

## 2018-11-22 DIAGNOSIS — I10 Essential (primary) hypertension: Secondary | ICD-10-CM | POA: Diagnosis not present

## 2018-11-22 MED ORDER — SUMATRIPTAN SUCCINATE 50 MG PO TABS: 50.0000 mg | ORAL_TABLET | Freq: Once | ORAL | 2 refills | Status: DC | PRN

## 2018-11-22 MED ORDER — AMLODIPINE BESYLATE 2.5 MG PO TABS: 2.5000 mg | ORAL_TABLET | Freq: Every day | ORAL | 1 refills | Status: DC

## 2018-11-22 NOTE — Progress Notes (Signed)
Subjective:    Patient ID: Crystal Levine, female    DOB: [birth year], 53 y.o.   MRN: [id number]  Crystal Levine is a 53 y.o. female presenting on 11/22/2018 for Migraine (onset 3 days but gotten worst from yesterday)  Accompanied by husband, [name].  HPI   FOLLOW-UP Migraines, chronic / Chronic HTN - Last visit with me in 2019, treated with managed on Amlodipine 2.5mg  for HTN and migraine prophylaxis, in past was on sumatriptan as well PRN, see prior notes for background information. - Interval update with has stopped all meds >6 months ago, remained off Amlodipine and off other migraine medicine - Today patient reports typical migraine headache localized R side onset 3 days worsening in past 24 hours without improvement. Not taking other migraine medication - No longer taking amlodipine for HTN, has had some elevated BP readings at home 130-140 / 90+ - She did better on this medication would like to restart - used Sumatriptan in past as well up to 100mg  in past PRN migraine Admits photophobia, mild nausea without vomiting - Denies any fevers chills, loss of vision, numbness tingling weakness   Depression screen [facility] 2/9 11/22/2018 02/18/2018 09/23/2017  Decreased Interest 0 0 0  Down, Depressed, Hopeless 0 0 0  PHQ - 2 Score 0 0 0  Altered sleeping 2 - 1  Tired, decreased energy 0 - 1  Change in appetite 0 - 2  Feeling bad or failure about yourself  0 - 0  Trouble concentrating 0 - 2  Moving slowly or fidgety/restless 0 - 0  Suicidal thoughts 0 - 0  PHQ-9 Score 2 - 6  Difficult doing work/chores Not difficult at all - Not difficult at all    Social History   Tobacco Use  . Smoking status: Never Smoker  . Smokeless tobacco: Never Used  Substance Use Topics  . Alcohol use: No  . Drug use: No    Review of Systems Per HPI unless specifically indicated above     Objective:    BP (!) 128/94   Pulse 65   Temp 98.6 F (37 C) (Oral)   Resp 16   Ht 5\' 4"  (1.626 m)   Wt  191 lb (86.6 kg)   BMI 32.79 kg/m   Wt Readings from Last 3 Encounters:  11/22/18 191 lb (86.6 kg)  02/18/18 189 lb (85.7 kg)  09/23/17 189 lb (85.7 kg)    Physical Exam Vitals signs and nursing note reviewed.  Constitutional:      General: She is not in acute distress.    Appearance: She is well-developed. She is not diaphoretic.     Comments: Well-appearing, comfortable, cooperative  HENT:     Head: Normocephalic and atraumatic.  Eyes:     General:        Right eye: No discharge.        Left eye: No discharge.     Conjunctiva/sclera: Conjunctivae normal.  Cardiovascular:     Rate and Rhythm: Normal rate.     Pulses: Normal pulses.     Heart sounds: Normal heart sounds. No murmur.  Pulmonary:     Effort: Pulmonary effort is normal.  Skin:    General: Skin is warm and dry.     Findings: No erythema or rash.  Neurological:     Mental Status: She is alert and oriented to person, place, and time.  Psychiatric:        Behavior: Behavior normal.     Comments:  Well groomed, good eye contact, normal speech and thoughts    Results for orders placed or performed in visit on 05/18/18  Comprehensive metabolic panel  Result Value Ref Range   Sodium 135 135 - 145 mmol/L   Potassium 3.6 3.5 - 5.1 mmol/L   Chloride 108 98 - 111 mmol/L   CO2 22 22 - 32 mmol/L   Glucose, Bld 115 (H) 70 - 99 mg/dL   BUN 13 6 - 20 mg/dL   Creatinine, Ser 0.55 0.44 - 1.00 mg/dL   Calcium 8.6 (L) 8.9 - 10.3 mg/dL   Total Protein 7.0 6.5 - 8.1 g/dL   Albumin 4.1 3.5 - 5.0 g/dL   AST 22 15 - 41 U/L   ALT 19 0 - 44 U/L   Alkaline Phosphatase 75 38 - 126 U/L   Total Bilirubin 0.6 0.3 - 1.2 mg/dL   GFR calc non Af Amer >60 >60 mL/min   GFR calc Af Amer >60 >60 mL/min   Anion gap 5 5 - 15  Iron and TIBC  Result Value Ref Range   Iron 49 28 - 170 ug/dL   TIBC 301 250 - 450 ug/dL   Saturation Ratios 16 10.4 - 31.8 %   UIBC 252 ug/dL  Ferritin  Result Value Ref Range   Ferritin 338 (H) 11 - 307  ng/mL  CBC with Differential  Result Value Ref Range   WBC 5.1 4.0 - 10.5 K/uL   RBC 4.57 3.87 - 5.11 MIL/uL   Hemoglobin 14.1 12.0 - 15.0 g/dL   HCT 42.2 36.0 - 46.0 %   MCV 92.3 80.0 - 100.0 fL   MCH 30.9 26.0 - 34.0 pg   MCHC 33.4 30.0 - 36.0 g/dL   RDW 12.4 11.5 - 15.5 %   Platelets 191 150 - 400 K/uL   nRBC 0.0 0.0 - 0.2 %   Neutrophils Relative % 56 %   Neutro Abs 2.9 1.7 - 7.7 K/uL   Lymphocytes Relative 32 %   Lymphs Abs 1.7 0.7 - 4.0 K/uL   Monocytes Relative 8 %   Monocytes Absolute 0.4 0.1 - 1.0 K/uL   Eosinophils Relative 3 %   Eosinophils Absolute 0.2 0.0 - 0.5 K/uL   Basophils Relative 1 %   Basophils Absolute 0.0 0.0 - 0.1 K/uL   Immature Granulocytes 0 %   Abs Immature Granulocytes 0.01 0.00 - 0.07 K/uL      Assessment & Plan:   Problem List Items Addressed This Visit    Essential hypertension  Mild elevated DBP, home readings elevated Previously on med with good results Off med now self discontinued  Will resume Amlodipine 2.5mg  daily, re order Likely reduce her migraines with controlled BP Can adjust dose in future up to 5mg  if need    Relevant Medications   amLODipine (NORVASC) 2.5 MG tablet   Migraine with aura and without status migrainosus, not intractable - Primary   Relevant Medications   amLODipine (NORVASC) 2.5 MG tablet   SUMAtriptan (IMITREX) 50 MG tablet      Active migraine now without improvement on conservative care, limited treatments Previously on CCB prophylaxis with Amlodipine also for HTN, now off  Plan Restart Amlodipine 2.5mg  daily Add Sumatriptan 50mg  tabs - take 1-2 tabs PRN (#12, 0 refill due to quantity limit), severe HA, may repeat dose within 2 hr if persistent, no more in 24 hours, in future can titrate dose to 50-100mg  if needed. Counseling on potential side effect / intolerance  with chest discomfort acutely after taking sumatriptan F/u in future may consider Neurology or other preventative meds  Meds ordered this  encounter  Medications  . amLODipine (NORVASC) 2.5 MG tablet    Sig: Take 1 tablet (2.5 mg total) by mouth daily.    Dispense:  90 tablet    Refill:  1  . SUMAtriptan (IMITREX) 50 MG tablet    Sig: Take 1-2 tablets (50-100 mg total) by mouth once as needed for up to 1 dose for migraine. May repeat one dose in 2 hours if headache persists, for max dose 24 hours    Dispense:  12 tablet    Refill:  2     Follow up plan: Return in about 3 months (around 02/22/2019) for 6 wk to 3 month virtual visit HTN / Migraines.   [name], DO [facility] 11/22/2018, 10:16 AM

## 2018-11-22 NOTE — Patient Instructions (Addendum)
Thank you for coming to the office today.  Re order Amlodipine 2.5mg  once daily, for BP and for Migraine prevention  Try not to miss doses. If you need stronger can double dose to 5mg ,  Let me know.  Treatment to STOP the headache at this time: - Start with Sumatriptan 50mg  - take 1 immediately at onset of moderate to severe migraine headache, if unresolved or return within 2 hours then repeat dose 1 tablet, that is max dose for 24 hours. (Note - this medication can cause a brief episode of flushing and chest pressure or pain very soon after taking it. That is NORMAL, and it is the medicine taking effect and dilating some blood vessels. It should pass, and resolve within seconds to minutes after - it may not happen at all)  YOU CAN INCREASE SUMATRIPTAN up to 100mg  if you prefer.  - Try this for 1-2 weeks, if absolutely NOT helping then contact me to discuss changes, possibly can double sumatriptan dose or try nasal spray - You can still take over the counter meds with Ibuprofen up to 600-800mg  per dose 3 times a day with food for a few days and may try Excedrin Migraine as needed, ONLY use these after you have tried Sumatriptan up to 2 doses, and try to limit their use if they are not effective   Please schedule a Follow-up Appointment to: Return in about 3 months (around 02/22/2019) for 6 wk to 3 month virtual visit HTN / Migraines.  If you have any other questions or concerns, please feel free to call the office or send a message through [place]. You may also schedule an earlier appointment if necessary.  Additionally, you may be receiving a survey about your experience at our office within a few days to 1 week by e-mail or mail. We value your feedback.  [name], DO [place]

## 2019-02-13 ENCOUNTER — Other Ambulatory Visit: Payer: Self-pay

## 2019-02-13 ENCOUNTER — Encounter: Payer: Self-pay | Admitting: Family Medicine

## 2019-02-13 ENCOUNTER — Ambulatory Visit (INDEPENDENT_AMBULATORY_CARE_PROVIDER_SITE_OTHER): Payer: Managed Care, Other (non HMO) | Admitting: Family Medicine

## 2019-02-13 VITALS — BP 134/78 | HR 59 | Temp 98.4°F | Resp 16 | Ht 64.0 in | Wt 189.0 lb

## 2019-02-13 DIAGNOSIS — Z1231 Encounter for screening mammogram for malignant neoplasm of breast: Secondary | ICD-10-CM

## 2019-02-13 DIAGNOSIS — Z0000 Encounter for general adult medical examination without abnormal findings: Secondary | ICD-10-CM | POA: Diagnosis not present

## 2019-02-13 DIAGNOSIS — E559 Vitamin D deficiency, unspecified: Secondary | ICD-10-CM

## 2019-02-13 DIAGNOSIS — K909 Intestinal malabsorption, unspecified: Secondary | ICD-10-CM

## 2019-02-13 DIAGNOSIS — E669 Obesity, unspecified: Secondary | ICD-10-CM | POA: Diagnosis not present

## 2019-02-13 DIAGNOSIS — E0590 Thyrotoxicosis, unspecified without thyrotoxic crisis or storm: Secondary | ICD-10-CM

## 2019-02-13 DIAGNOSIS — R7989 Other specified abnormal findings of blood chemistry: Secondary | ICD-10-CM

## 2019-02-13 DIAGNOSIS — E538 Deficiency of other specified B group vitamins: Secondary | ICD-10-CM

## 2019-02-13 DIAGNOSIS — D508 Other iron deficiency anemias: Secondary | ICD-10-CM

## 2019-02-13 DIAGNOSIS — I10 Essential (primary) hypertension: Secondary | ICD-10-CM

## 2019-02-13 NOTE — Progress Notes (Signed)
Subjective:    Patient ID: Crystal Levine, female    DOB: [birth year], 53 y.o.   MRN: [id number]  Crystal Levine is a 53 y.o. female presenting on 02/13/2019 for Annual Exam   HPI  Here for Annual Physical and Lab Review.  Wellness / Lifestyle No new significant chronic health concerns Weight is stable Tries to eat balanced diet  FOLLOW-UP Migraines, chronic / Chronic HTN improved now on amlodipine low dose and also stress can provoke migraine, has improved overall, taking imitrex PRN as well. Interested in other headache prevention methods and med - Denies any fevers chills, loss of vision, numbness tingling weakness  Health Maintenance:  Breast CA Screening: Due for mammogram screening. Last mammogram result several years ago, out of town, already sent to [place]. No prior history abnormal mammogram Adopted so No known family history of breast cancer. Currently asymptomatic.  Due for Flu Shot, declines today despite counseling on benefits   Depression screen [facility] 2/9 02/13/2019 11/22/2018 02/18/2018  Decreased Interest 0 0 0  Down, Depressed, Hopeless 0 0 0  PHQ - 2 Score 0 0 0  Altered sleeping - 2 -  Tired, decreased energy - 0 -  Change in appetite - 0 -  Feeling bad or failure about yourself  - 0 -  Trouble concentrating - 0 -  Moving slowly or fidgety/restless - 0 -  Suicidal thoughts - 0 -  PHQ-9 Score - 2 -  Difficult doing work/chores - Not difficult at all -    Past Medical History:  Diagnosis Date  . Anemia   . Anxiety   . Depression   . Hypertension   . Migraine    In past   Past Surgical History:  Procedure Laterality Date  . CHOLECYSTECTOMY    . COLONOSCOPY WITH PROPOFOL N/A 05/27/2017   Procedure: COLONOSCOPY WITH PROPOFOL;  Surgeon: Lin Landsman, MD;  Location: [facility] ENDOSCOPY;  Service: Gastroenterology;  Laterality: N/A;  . DILATION AND CURETTAGE OF UTERUS  2003   2 Miscarriages  . DILITATION & CURRETTAGE/HYSTROSCOPY WITH NOVASURE  ABLATION N/A 12/16/2015   Procedure: DILATATION & CURETTAGE/HYSTEROSCOPY WITH NOVASURE ABLATION;  Surgeon: Brayton Mars, MD;  Location: [facility] ORS;  Service: Gynecology;  Laterality: N/A;  . GALLBLADDER SURGERY  2003  . GASTRIC BYPASS  2004  . GASTRIC BYPASS    . HERNIA REPAIR  2005  . HERNIA REPAIR     Social History   Socioeconomic History  . Marital status: Married    Spouse name: Not on file  . Number of children: Not on file  . Years of education: Not on file  . Highest education level: Not on file  Occupational History  . Not on file  Social Needs  . Financial resource strain: Not on file  . Food insecurity    Worry: Not on file    Inability: Not on file  . Transportation needs    Medical: Not on file    Non-medical: Not on file  Tobacco Use  . Smoking status: Never Smoker  . Smokeless tobacco: Never Used  Substance and Sexual Activity  . Alcohol use: No  . Drug use: No  . Sexual activity: Yes    Comment: vasectomy  Lifestyle  . Physical activity    Days per week: Not on file    Minutes per session: Not on file  . Stress: Not on file  Relationships  . Social connections    Talks on phone: Not on file  Gets together: Not on file    Attends religious service: Not on file    Active member of club or organization: Not on file    Attends meetings of clubs or organizations: Not on file    Relationship status: Not on file  . Intimate partner violence    Fear of current or ex partner: Not on file    Emotionally abused: Not on file    Physically abused: Not on file    Forced sexual activity: Not on file  Other Topics Concern  . Not on file  Social History Narrative   ** Merged History Encounter **       Family History  Adopted: Yes  Family history unknown: Yes   Current Outpatient Medications on File Prior to Visit  Medication Sig  . amLODipine (NORVASC) 2.5 MG tablet Take 1 tablet (2.5 mg total) by mouth daily.  . cyanocobalamin (,VITAMIN B-12,)  1000 MCG/ML injection ADMINISTER 1 ML(1000 MCG) IN THE MUSCLE EVERY 30 DAYS  . SUMAtriptan (IMITREX) 50 MG tablet Take 1-2 tablets (50-100 mg total) by mouth once as needed for up to 1 dose for migraine. May repeat one dose in 2 hours if headache persists, for max dose 24 hours   No current facility-administered medications on file prior to visit.     Review of Systems  Constitutional: Negative for activity change, appetite change, chills, diaphoresis, fatigue and fever.  HENT: Negative for congestion and hearing loss.   Eyes: Negative for visual disturbance.  Respiratory: Negative for apnea, cough, chest tightness, shortness of breath and wheezing.   Cardiovascular: Negative for chest pain, palpitations and leg swelling.  Gastrointestinal: Negative for abdominal pain, anal bleeding, blood in stool, constipation, diarrhea, nausea and vomiting.  Endocrine: Negative for cold intolerance.  Genitourinary: Negative for difficulty urinating, dysuria, frequency and hematuria.  Musculoskeletal: Negative for arthralgias, back pain and neck pain.  Skin: Negative for rash.  Allergic/Immunologic: Negative for environmental allergies.  Neurological: Negative for dizziness, weakness, light-headedness, numbness and headaches.       Migraines improved  Hematological: Negative for adenopathy.  Psychiatric/Behavioral: Negative for behavioral problems, dysphoric mood and sleep disturbance. The patient is not nervous/anxious.    Per HPI unless specifically indicated above     Objective:    BP 134/78 (BP Location: Left Arm, Cuff Size: Normal)   Pulse (!) 59   Temp 98.4 F (36.9 C) (Oral)   Resp 16   Ht 5\' 4"  (1.626 m)   Wt 189 lb (85.7 kg)   BMI 32.44 kg/m   Wt Readings from Last 3 Encounters:  02/13/19 189 lb (85.7 kg)  11/22/18 191 lb (86.6 kg)  02/18/18 189 lb (85.7 kg)    Physical Exam Vitals signs and nursing note reviewed.  Constitutional:      General: She is not in acute distress.     Appearance: She is well-developed. She is not diaphoretic.     Comments: Well-appearing, comfortable, cooperative, obesity  HENT:     Head: Normocephalic and atraumatic.  Eyes:     General:        Right eye: No discharge.        Left eye: No discharge.     Conjunctiva/sclera: Conjunctivae normal.     Pupils: Pupils are equal, round, and reactive to light.  Neck:     Musculoskeletal: Normal range of motion and neck supple.     Thyroid: No thyromegaly.     Comments: No carotid bruits Cardiovascular:  Rate and Rhythm: Normal rate and regular rhythm.     Heart sounds: Normal heart sounds. No murmur.  Pulmonary:     Effort: Pulmonary effort is normal. No respiratory distress.     Breath sounds: Normal breath sounds. No wheezing or rales.  Abdominal:     General: Bowel sounds are normal. There is no distension.     Palpations: Abdomen is soft. There is no mass.     Tenderness: There is no abdominal tenderness.  Musculoskeletal: Normal range of motion.        General: No tenderness.     Comments: Upper / Lower Extremities: - Normal muscle tone, strength bilateral upper extremities 5/5, lower extremities 5/5  Lymphadenopathy:     Cervical: No cervical adenopathy.  Skin:    General: Skin is warm and dry.     Findings: No erythema or rash.  Neurological:     Mental Status: She is alert and oriented to person, place, and time.     Comments: Distal sensation intact to light touch all extremities  Psychiatric:        Behavior: Behavior normal.     Comments: Well groomed, good eye contact, normal speech and thoughts    Results for orders placed or performed in visit on 05/18/18  Comprehensive metabolic panel  Result Value Ref Range   Sodium 135 135 - 145 mmol/L   Potassium 3.6 3.5 - 5.1 mmol/L   Chloride 108 98 - 111 mmol/L   CO2 22 22 - 32 mmol/L   Glucose, Bld 115 (H) 70 - 99 mg/dL   BUN 13 6 - 20 mg/dL   Creatinine, Ser 0.55 0.44 - 1.00 mg/dL   Calcium 8.6 (L) 8.9 - 10.3  mg/dL   Total Protein 7.0 6.5 - 8.1 g/dL   Albumin 4.1 3.5 - 5.0 g/dL   AST 22 15 - 41 U/L   ALT 19 0 - 44 U/L   Alkaline Phosphatase 75 38 - 126 U/L   Total Bilirubin 0.6 0.3 - 1.2 mg/dL   GFR calc non Af Amer >60 >60 mL/min   GFR calc Af Amer >60 >60 mL/min   Anion gap 5 5 - 15  Iron and TIBC  Result Value Ref Range   Iron 49 28 - 170 ug/dL   TIBC 301 250 - 450 ug/dL   Saturation Ratios 16 10.4 - 31.8 %   UIBC 252 ug/dL  Ferritin  Result Value Ref Range   Ferritin 338 (H) 11 - 307 ng/mL  CBC with Differential  Result Value Ref Range   WBC 5.1 4.0 - 10.5 K/uL   RBC 4.57 3.87 - 5.11 MIL/uL   Hemoglobin 14.1 12.0 - 15.0 g/dL   HCT 42.2 36.0 - 46.0 %   MCV 92.3 80.0 - 100.0 fL   MCH 30.9 26.0 - 34.0 pg   MCHC 33.4 30.0 - 36.0 g/dL   RDW 12.4 11.5 - 15.5 %   Platelets 191 150 - 400 K/uL   nRBC 0.0 0.0 - 0.2 %   Neutrophils Relative % 56 %   Neutro Abs 2.9 1.7 - 7.7 K/uL   Lymphocytes Relative 32 %   Lymphs Abs 1.7 0.7 - 4.0 K/uL   Monocytes Relative 8 %   Monocytes Absolute 0.4 0.1 - 1.0 K/uL   Eosinophils Relative 3 %   Eosinophils Absolute 0.2 0.0 - 0.5 K/uL   Basophils Relative 1 %   Basophils Absolute 0.0 0.0 - 0.1 K/uL   Immature Granulocytes  0 %   Abs Immature Granulocytes 0.01 0.00 - 0.07 K/uL      Assessment & Plan:   Problem List Items Addressed This Visit    Vitamin D deficiency   Relevant Orders   VITAMIN D 25 Hydroxy (Vit-D Deficiency, Fractures)   Vitamin B12 deficiency due to intestinal malabsorption   Relevant Orders   Vitamin B12   Subclinical hyperthyroidism   Relevant Orders   TSH   T4, free   Obesity (BMI 30.0-34.9)   Relevant Orders   Lipid panel   Low TSH level   Relevant Orders   TSH   T4, free   Iron deficiency anemia   Relevant Orders   CBC with Differential/Platelet   Essential hypertension   Relevant Orders   COMPLETE METABOLIC PANEL WITH GFR    Other Visit Diagnoses    Annual physical exam    -  Primary   Relevant  Orders   Hemoglobin A1c   CBC with Differential/Platelet   COMPLETE METABOLIC PANEL WITH GFR   Lipid panel   Encounter for screening mammogram for malignant neoplasm of breast       Relevant Orders   MM DIGITAL SCREENING BILATERAL      Updated Health Maintenance information Add lab panel today, f/u results Encouraged improvement to lifestyle with diet and exercise - Goal of weight loss - consider Keto diet  Ordered mammogram  Future migraines consider Emgality or CGRP med  No orders of the defined types were placed in this encounter.    Follow up plan: Return in about 6 months (around 08/14/2019) for 6 month follow-up HTN, Migraines.  [name], [facility] 02/13/2019, 10:00 AM

## 2019-02-13 NOTE — Patient Instructions (Addendum)
Thank you for coming to the office today.  Keep a watch on BP - check readings, send message in 2 weeks with results, if avg >140 then we can increase AMlodipine 2.5 up to 5mg , if < 140 we can keep 2.5  For Mammogram screening for breast cancer   Call the [place] below anytime to schedule your own appointment now that order has been placed.  [facility] [address], [place] [address] Phone: [phone]  Labs today - stay tuned  - Make sure Lab Only appointment is at about 1 week before your next appointment, so that results will be available  For Lab Results, once available within 2-3 days of blood draw, you can can log in to MyChart online to view your results and a brief explanation. Also, we can discuss results at next follow-up visit.   Please schedule a Follow-up Appointment to: Return in about 6 months (around 08/14/2019) for 6 month follow-up HTN, Migraines.  If you have any other questions or concerns, please feel free to call the office or send a message through [place]. You may also schedule an earlier appointment if necessary.  Additionally, you may be receiving a survey about your experience at our office within a few days to 1 week by e-mail or mail. We value your feedback.  Crystal Putnam, DO [place]

## 2019-02-14 LAB — CBC WITH DIFFERENTIAL/PLATELET
Absolute Monocytes: 316 cells/uL (ref 200–950)
Basophils Absolute: 29 cells/uL (ref 0–200)
Basophils Relative: 0.7 %
Eosinophils Absolute: 144 cells/uL (ref 15–500)
Eosinophils Relative: 3.5 %
HCT: 44.8 % (ref 35.0–45.0)
Hemoglobin: 15 g/dL (ref 11.7–15.5)
Lymphs Abs: 1611 cells/uL (ref 850–3900)
MCH: 31.2 pg (ref 27.0–33.0)
MCHC: 33.5 g/dL (ref 32.0–36.0)
MCV: 93.1 fL (ref 80.0–100.0)
MPV: 11.7 fL (ref 7.5–12.5)
Monocytes Relative: 7.7 %
Neutro Abs: 2001 cells/uL (ref 1500–7800)
Neutrophils Relative %: 48.8 %
Platelets: 203 10*3/uL (ref 140–400)
RBC: 4.81 10*6/uL (ref 3.80–5.10)
RDW: 12.3 % (ref 11.0–15.0)
Total Lymphocyte: 39.3 %
WBC: 4.1 10*3/uL (ref 3.8–10.8)

## 2019-02-14 LAB — COMPLETE METABOLIC PANEL WITH GFR
AG Ratio: 1.8 (calc) (ref 1.0–2.5)
ALT: 12 U/L (ref 6–29)
AST: 16 U/L (ref 10–35)
Albumin: 4.2 g/dL (ref 3.6–5.1)
Alkaline phosphatase (APISO): 73 U/L (ref 37–153)
BUN: 10 mg/dL (ref 7–25)
CO2: 24 mmol/L (ref 20–32)
Calcium: 9.2 mg/dL (ref 8.6–10.4)
Chloride: 108 mmol/L (ref 98–110)
Creat: 0.75 mg/dL (ref 0.50–1.05)
GFR, Est African American: 107 mL/min/{1.73_m2} (ref >=60)
GFR, Est Non African American: 92 mL/min/{1.73_m2} (ref >=60)
Globulin: 2.3 g/dL (calc) (ref 1.9–3.7)
Glucose, Bld: 80 mg/dL (ref 65–99)
Potassium: 4.2 mmol/L (ref 3.5–5.3)
Sodium: 142 mmol/L (ref 135–146)
Total Bilirubin: 0.8 mg/dL (ref 0.2–1.2)
Total Protein: 6.5 g/dL (ref 6.1–8.1)

## 2019-02-14 LAB — LIPID PANEL
Cholesterol: 145 mg/dL (ref <200)
HDL: 75 mg/dL (ref >=50)
LDL Cholesterol (Calc): 54 mg/dL (calc)
Non-HDL Cholesterol (Calc): 70 mg/dL (calc) (ref <130)
Total CHOL/HDL Ratio: 1.9 (calc) (ref <5.0)
Triglycerides: 78 mg/dL (ref <150)

## 2019-02-14 LAB — VITAMIN D 25 HYDROXY (VIT D DEFICIENCY, FRACTURES): Vit D, 25-Hydroxy: 20 ng/mL — ABNORMAL LOW (ref 30–100)

## 2019-02-14 LAB — HEMOGLOBIN A1C
Hgb A1c MFr Bld: 4.5 % of total Hgb (ref <5.7)
Mean Plasma Glucose: 82 (calc)
eAG (mmol/L): 4.6 (calc)

## 2019-02-14 LAB — TSH: TSH: 0.39 mIU/L — ABNORMAL LOW

## 2019-02-14 LAB — VITAMIN B12: Vitamin B-12: 295 pg/mL (ref 200–1100)

## 2019-02-14 LAB — T4, FREE: Free T4: 1.2 ng/dL (ref 0.8–1.8)

## 2019-03-09 ENCOUNTER — Other Ambulatory Visit: Payer: Self-pay

## 2019-03-09 ENCOUNTER — Encounter: Payer: Self-pay | Admitting: Emergency Medicine

## 2019-03-09 ENCOUNTER — Observation Stay
Admission: EM | Admit: 2019-03-09 | Discharge: 2019-03-10 | Disposition: A | Discharge status: Home / Self Care | Payer: Managed Care, Other (non HMO) | Attending: Internal Medicine | Admitting: Internal Medicine

## 2019-03-09 ENCOUNTER — Emergency Department: Payer: Managed Care, Other (non HMO)

## 2019-03-09 DIAGNOSIS — Z79899 Other long term (current) drug therapy: Secondary | ICD-10-CM | POA: Diagnosis not present

## 2019-03-09 DIAGNOSIS — R9431 Abnormal electrocardiogram [ECG] [EKG]: Secondary | ICD-10-CM | POA: Insufficient documentation

## 2019-03-09 DIAGNOSIS — E559 Vitamin D deficiency, unspecified: Secondary | ICD-10-CM | POA: Insufficient documentation

## 2019-03-09 DIAGNOSIS — R008 Other abnormalities of heart beat: Secondary | ICD-10-CM | POA: Insufficient documentation

## 2019-03-09 DIAGNOSIS — Z6833 Body mass index (BMI) 33.0-33.9, adult: Secondary | ICD-10-CM | POA: Insufficient documentation

## 2019-03-09 DIAGNOSIS — F329 Major depressive disorder, single episode, unspecified: Secondary | ICD-10-CM | POA: Insufficient documentation

## 2019-03-09 DIAGNOSIS — E669 Obesity, unspecified: Secondary | ICD-10-CM | POA: Diagnosis not present

## 2019-03-09 DIAGNOSIS — R079 Chest pain, unspecified: Secondary | ICD-10-CM | POA: Diagnosis not present

## 2019-03-09 DIAGNOSIS — I493 Ventricular premature depolarization: Secondary | ICD-10-CM | POA: Insufficient documentation

## 2019-03-09 DIAGNOSIS — I498 Other specified cardiac arrhythmias: Secondary | ICD-10-CM | POA: Diagnosis present

## 2019-03-09 DIAGNOSIS — E538 Deficiency of other specified B group vitamins: Secondary | ICD-10-CM | POA: Diagnosis not present

## 2019-03-09 DIAGNOSIS — Z20828 Contact with and (suspected) exposure to other viral communicable diseases: Secondary | ICD-10-CM | POA: Insufficient documentation

## 2019-03-09 DIAGNOSIS — Z9884 Bariatric surgery status: Secondary | ICD-10-CM | POA: Diagnosis not present

## 2019-03-09 DIAGNOSIS — I10 Essential (primary) hypertension: Secondary | ICD-10-CM | POA: Diagnosis not present

## 2019-03-09 DIAGNOSIS — F419 Anxiety disorder, unspecified: Secondary | ICD-10-CM | POA: Diagnosis present

## 2019-03-09 DIAGNOSIS — I160 Hypertensive urgency: Secondary | ICD-10-CM

## 2019-03-09 LAB — CBC
HCT: 42.4 % (ref 36.0–46.0)
Hemoglobin: 15 g/dL (ref 12.0–15.0)
MCH: 31.4 pg (ref 26.0–34.0)
MCHC: 35.4 g/dL (ref 30.0–36.0)
MCV: 88.7 fL (ref 80.0–100.0)
Platelets: 181 10*3/uL (ref 150–400)
RBC: 4.78 MIL/uL (ref 3.87–5.11)
RDW: 12.4 % (ref 11.5–15.5)
WBC: 5.5 10*3/uL (ref 4.0–10.5)
nRBC: 0 % (ref 0.0–0.2)

## 2019-03-09 LAB — TSH: TSH: 0.479 u[IU]/mL (ref 0.350–4.500)

## 2019-03-09 LAB — TROPONIN I (HIGH SENSITIVITY)
Troponin I (High Sensitivity): <2 ng/L (ref <18)
Troponin I (High Sensitivity): 2 ng/L (ref <18)
Troponin I (High Sensitivity): 3 ng/L (ref <18)
Troponin I (High Sensitivity): <2 ng/L (ref <18)

## 2019-03-09 LAB — BASIC METABOLIC PANEL
Anion gap: 8 (ref 5–15)
BUN: 11 mg/dL (ref 6–20)
CO2: 25 mmol/L (ref 22–32)
Calcium: 9 mg/dL (ref 8.9–10.3)
Chloride: 105 mmol/L (ref 98–111)
Creatinine, Ser: 0.72 mg/dL (ref 0.44–1.00)
GFR calc Af Amer: >60 mL/min (ref >60)
GFR calc non Af Amer: >60 mL/min (ref >60)
Glucose, Bld: 88 mg/dL (ref 70–99)
Potassium: 4 mmol/L (ref 3.5–5.1)
Sodium: 138 mmol/L (ref 135–145)

## 2019-03-09 LAB — MAGNESIUM: Magnesium: 2 mg/dL (ref 1.7–2.4)

## 2019-03-09 MED ORDER — HYDROCODONE-ACETAMINOPHEN 5-325 MG PO TABS: 2.0000 | ORAL_TABLET | Freq: Once | ORAL | Status: DC

## 2019-03-09 MED ORDER — ONDANSETRON HCL 4 MG/2ML IJ SOLN
4.0000 mg | Freq: Once | INTRAMUSCULAR | Status: AC
  Administered 2019-03-09: 4 mg via INTRAVENOUS
  Filled 2019-03-09: qty 2

## 2019-03-09 MED ORDER — SODIUM CHLORIDE 0.9% FLUSH
3.0000 mL | Freq: Once | INTRAVENOUS | Status: AC
  Administered 2019-03-09: 3 mL via INTRAVENOUS

## 2019-03-09 MED ORDER — MORPHINE SULFATE (PF) 2 MG/ML IV SOLN
2.0000 mg | INTRAVENOUS | Status: DC | PRN
  Administered 2019-03-09 – 2019-03-10 (×3): 2 mg via INTRAVENOUS
  Filled 2019-03-09 (×3): qty 1

## 2019-03-09 MED ORDER — LABETALOL HCL 5 MG/ML IV SOLN
10.0000 mg | Freq: Once | INTRAVENOUS | Status: DC
  Filled 2019-03-09: qty 4

## 2019-03-09 MED ORDER — MORPHINE SULFATE (PF) 4 MG/ML IV SOLN
4.0000 mg | Freq: Once | INTRAVENOUS | Status: AC
  Administered 2019-03-09: 4 mg via INTRAVENOUS
  Filled 2019-03-09: qty 1

## 2019-03-09 MED ORDER — ASPIRIN 81 MG PO CHEW
324.0000 mg | CHEWABLE_TABLET | Freq: Once | ORAL | Status: AC
  Administered 2019-03-09: 324 mg via ORAL
  Filled 2019-03-09: qty 4

## 2019-03-09 MED ORDER — ENOXAPARIN SODIUM 40 MG/0.4ML ~~LOC~~ SOLN
40.0000 mg | SUBCUTANEOUS | Status: DC
  Administered 2019-03-09: 40 mg via SUBCUTANEOUS
  Filled 2019-03-09: qty 0.4

## 2019-03-09 MED ORDER — ACETAMINOPHEN 325 MG PO TABS
650.0000 mg | ORAL_TABLET | ORAL | Status: DC | PRN
  Administered 2019-03-10 (×2): 650 mg via ORAL
  Filled 2019-03-09 (×2): qty 2

## 2019-03-09 MED ORDER — ONDANSETRON HCL 4 MG/2ML IJ SOLN: 4.0000 mg | Freq: Four times a day (QID) | INTRAMUSCULAR | Status: DC | PRN

## 2019-03-09 NOTE — ED Provider Notes (Signed)
[facility] Emergency Department Provider Note  ____________________________________________   First MD Initiated Contact with Patient 03/09/19 1555     (approximate)  I have reviewed the triage vital signs and the nursing notes.   HISTORY  Chief Complaint No chief complaint on file.    HPI Crystal Levine is a 53 y.o. female with past medical history as below here with intermittent chest pain and palpitations.  The patient states that starting last night, she has had intermittent episodes in which she feels a dull, aching, substernal chest pressure with associated palpitations and irregular heartbeat.  She states that the symptoms began while at rest yesterday and have been intermittent.  She felt short of breath and broke out in a sweat when they came on.  Denies any alleviating factors.  Denies any specific worsening factors.  Denies known history of coronary disease.  She does admit to significantly increased stress recently.  No recent medication changes.  No known history of arrhythmia.        Past Medical History:  Diagnosis Date  . Anemia   . Anxiety   . Depression   . Hypertension   . Migraine    In past    Patient Active Problem List   Diagnosis Date Noted  . Chest pain 03/09/2019  . Constipation   . Cervical high risk human papillomavirus (HPV) DNA test positive 05/12/2017  . Obesity (BMI 30.0-34.9) 08/10/2016  . Low TSH level 07/13/2016  . Essential hypertension 07/13/2016  . Migraine with aura and without status migrainosus, not intractable 07/13/2016  . 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 Copper deficiency 02/21/2016  . Vitamin B12 deficiency due to intestinal malabsorption 01/29/2016  . History of endometrial ablation 12/31/2015  . B12 deficiency anemia 11/06/2015  . Insomnia 09/04/2015  . B12 deficiency 09/04/2015  . Subclinical hyperthyroidism 09/04/2015  . Vitamin D deficiency 09/04/2015  . Iron deficiency anemia 07/09/2015   . Anxiety and depression 07/09/2015  . S/P gastric bypass 07/09/2015    Past Surgical History:  Procedure Laterality Date  . CHOLECYSTECTOMY    . COLONOSCOPY WITH PROPOFOL N/A 05/27/2017   Procedure: COLONOSCOPY WITH PROPOFOL;  Surgeon: Lin Landsman, MD;  Location: [facility] ENDOSCOPY;  Service: Gastroenterology;  Laterality: N/A;  . DILATION AND CURETTAGE OF UTERUS  2003   2 Miscarriages  . DILITATION & CURRETTAGE/HYSTROSCOPY WITH NOVASURE ABLATION N/A 12/16/2015   Procedure: DILATATION & CURETTAGE/HYSTEROSCOPY WITH NOVASURE ABLATION;  Surgeon: Brayton Mars, MD;  Location: [facility] ORS;  Service: Gynecology;  Laterality: N/A;  . GALLBLADDER SURGERY  2003  . GASTRIC BYPASS  2004  . GASTRIC BYPASS    . HERNIA REPAIR  2005  . HERNIA REPAIR      Prior to Admission medications   Medication Sig Start Date End Date Taking? Authorizing Provider  amLODipine (NORVASC) 2.5 MG tablet Take 1 tablet (2.5 mg total) by mouth daily. 11/22/18  Yes Karamalegos, Alexander J, DO  cyanocobalamin (,VITAMIN B-12,) 1000 MCG/ML injection ADMINISTER 1 ML(1000 MCG) IN THE MUSCLE EVERY 30 DAYS Patient taking differently: Inject 1,000 mcg into the muscle every 30 (thirty) days.  11/10/18  Yes Cammie Sickle, MD  SUMAtriptan (IMITREX) 50 MG tablet Take 1-2 tablets (50-100 mg total) by mouth once as needed for up to 1 dose for migraine. May repeat one dose in 2 hours if headache persists, for max dose 24 hours 11/22/18  Yes Karamalegos, Devonne Doughty, DO    Allergies Patient has no known allergies.  Family  History  Adopted: Yes  Family history unknown: Yes    Social History Social History   Tobacco Use  . Smoking status: Never Smoker  . Smokeless tobacco: Never Used  Substance Use Topics  . Alcohol use: No  . Drug use: No    Review of Systems  Review of Systems  Constitutional: Positive for fatigue. Negative for fever.  HENT: Negative for congestion and sore throat.   Eyes: Negative for  visual disturbance.  Respiratory: Positive for chest tightness. Negative for cough and shortness of breath.   Cardiovascular: Positive for chest pain and palpitations.  Gastrointestinal: Negative for abdominal pain, diarrhea, nausea and vomiting.  Genitourinary: Negative for flank pain.  Musculoskeletal: Negative for back pain and neck pain.  Skin: Negative for rash and wound.  Neurological: Positive for weakness.  All other systems reviewed and are negative.    ____________________________________________  PHYSICAL EXAM:      VITAL SIGNS: ED Triage Vitals  Enc Vitals Group     BP 03/09/19 1430 (!) 147/96     Pulse Rate 03/09/19 1430 65     Resp 03/09/19 1430 16     Temp 03/09/19 1430 98.2 F (36.8 C)     Temp Source 03/09/19 1430 Oral     SpO2 03/09/19 1430 100 %     Weight 03/09/19 1431 189 lb (85.7 kg)     Height 03/09/19 1431 5\' 3"  (1.6 m)     Head Circumference --      Peak Flow --      Pain Score 03/09/19 1430 6     Pain Loc --      Pain Edu? --      Excl. in [place]? --      Physical Exam Vitals signs and nursing note reviewed.  Constitutional:      General: She is not in acute distress.    Appearance: She is well-developed.  HENT:     Head: Normocephalic and atraumatic.  Eyes:     Conjunctiva/sclera: Conjunctivae normal.  Neck:     Musculoskeletal: Neck supple.  Cardiovascular:     Rate and Rhythm: Normal rate. Rhythm irregular.     Heart sounds: Normal heart sounds. No murmur. No friction rub.     Comments: Occasional extrasystoles Pulmonary:     Effort: Pulmonary effort is normal. No respiratory distress.     Breath sounds: Normal breath sounds. No wheezing or rales.  Abdominal:     General: There is no distension.     Palpations: Abdomen is soft.     Tenderness: There is no abdominal tenderness.  Skin:    General: Skin is warm.     Capillary Refill: Capillary refill takes less than 2 seconds.  Neurological:     Mental Status: She is alert and  oriented to person, place, and time.     Motor: No abnormal muscle tone.       ____________________________________________   LABS (all labs ordered are listed, but only abnormal results are displayed)  Labs Reviewed  SARS CORONAVIRUS 2 (TAT 6-24 HRS)  BASIC METABOLIC PANEL  CBC  TSH  MAGNESIUM  HIV ANTIBODY (ROUTINE TESTING W REFLEX)  TROPONIN I (HIGH SENSITIVITY)  TROPONIN I (HIGH SENSITIVITY)  TROPONIN I (HIGH SENSITIVITY)  TROPONIN I (HIGH SENSITIVITY)    ____________________________________________  EKG: Normal sinus rhythm, ventricular rate 66.  PR 162, QRS 82, QTc 421.  No acute ST elevations or depressions. ________________________________________  RADIOLOGY All imaging, including plain films, CT scans, and  ultrasounds, independently reviewed by me, and interpretations confirmed via formal radiology reads.  ED MD interpretation:   Chest x-ray: Clear  Official radiology report(s): Dg Chest 2 View  Result Date: 03/09/2019 CLINICAL DATA:  Chest pain and shortness of breath EXAM: CHEST - 2 VIEW COMPARISON:  June 21, 2017 FINDINGS: Lungs are clear. Heart size and pulmonary vascularity are normal. No adenopathy. No pneumothorax. No bone lesions. There are surgical clips in the right upper abdomen. IMPRESSION: No edema or consolidation. Electronically Signed   By: Lowella Grip III M.D.   On: 03/09/2019 14:48    ____________________________________________  PROCEDURES   Procedure(s) performed (including Critical Care):  Procedures  ____________________________________________  INITIAL IMPRESSION / MDM / [place] / ED COURSE  As part of my medical decision making, I reviewed the following data within the [place] notes reviewed and incorporated, Old chart reviewed, Notes from prior ED visits, and [place] Controlled Substance Database       *TILDA BARUA was evaluated in Emergency Department on 03/09/2019 for the  symptoms described in the history of present illness. She was evaluated in the context of the global COVID-19 pandemic, which necessitated consideration that the patient might be at risk for infection with the SARS-CoV-2 virus that causes COVID-19. Institutional protocols and algorithms that pertain to the evaluation of patients at risk for COVID-19 are in a state of rapid change based on information released by regulatory bodies including the CDC and federal and state organizations. These policies and algorithms were followed during the patient's care in the ED.  Some ED evaluations and interventions may be delayed as a result of limited staffing during the pandemic.*  Clinical Course as of Mar 08 2248  Thu Mar 09, 2019  1723 Lab work is very reassuring.  Troponin undetectable.  Of note, while here, patient did intermittently go into what appears to be bigeminy.  This correlates with when she has her pain and symptoms.  Will plan to discuss with cardiology.  Given negative troponin with no EKG changes of ischemia, do not feel this is likely ischemic.  Unclear trigger.  Electrolytes are within normal limits.  She is not anemic.   [CI]    Clinical Course User Index [CI] Duffy Bruce, MD    Medical Decision Making:  As above. Pt increasingly symptomatic in ED, correlating with episodes of Bigeminy. While this could be anxiety related, given ongoing CP, increasing ectopy and bigeminy, will admit for obs.  ____________________________________________  FINAL CLINICAL IMPRESSION(S) / ED DIAGNOSES  Final diagnoses:  Bigeminy  Chest pain, unspecified type     MEDICATIONS GIVEN DURING THIS VISIT:  Medications  acetaminophen (TYLENOL) tablet 650 mg (has no administration in time range)  ondansetron (ZOFRAN) injection 4 mg (has no administration in time range)  enoxaparin (LOVENOX) injection 40 mg (40 mg Subcutaneous Given 03/09/19 2219)  morphine 2 MG/ML injection 2 mg (2 mg Intravenous Given  03/09/19 2219)  sodium chloride flush (NS) 0.9 % injection 3 mL (3 mLs Intravenous Given 03/09/19 1751)  morphine 4 MG/ML injection 4 mg (4 mg Intravenous Given 03/09/19 1751)  ondansetron (ZOFRAN) injection 4 mg (4 mg Intravenous Given 03/09/19 1751)  aspirin chewable tablet 324 mg (324 mg Oral Given 03/09/19 1841)     ED Discharge Orders    None       Note:  This document was prepared using Dragon voice recognition software and may include unintentional dictation errors.   Duffy Bruce, MD 03/09/19  2249  

## 2019-03-09 NOTE — ED Notes (Signed)
Pt states chest pain is still present but decreased after aspirin. Pt appears calm, resting in bed.

## 2019-03-09 NOTE — H&P (Addendum)
History and Physical    Crystal Levine [id number] DOB: November 17, 1965 DOA: 03/09/2019  PCP: Olin Hauser, DO  Patient coming from: Home  I have personally briefly reviewed patient's old medical records in [place]  Chief Complaint: Chest pain  HPI: Crystal Levine is a 53 y.o. female with medical history significant of gastric bypass with subsequent iron deficiency anemia requiring IV iron transfusion, migraine headache, hypertension and anxiety who presents with concerns of chest pain.  Last night she began to feel constant pressure and sharp-like mid sternal chest pain that would radiate to her right arm.  This is associated with diaphoresis, palpitation and shortness of breath.  She took her blood pressure at home and found to be 176/111.  Her chest pain became worse today prompting her to present to the ED for further evaluation.  When she presented to the ED, she was hypertensive up to 180s over 100 with normal HR and per ED physician patient was noted to have bigeminy PVCs on telemetry each time that she had chest pain symptoms.  EDP consulted with cardiology who initially recommended she be discharged home with close outpatient follow-up, however patient later had a near syncopal episode prompting admission for observation for further evaluation with telemetry. Patient endorse that she has been more fatigued and stressed at work lately but nothing more than usual.  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  She denies any tobacco, alcohol or illicit drug use.  She endorsed heavy caffeine use about 2 months ago but since then has been cutting down and only drinks 1 cup of coffee a day or every other day. Patient is unaware family family history of cardiac issue as she is adopted.  CBC and BMP was unremarkable. Troponin of 2 and 2. Magnesium of 2. TSH is normal. EKG shows normal sinus rhythm with frequent PVCs.  CXR negative.   She was given 325 mg aspirin and  4mg  morphine in the ED with minimal relief of chest pain.   Review of Systems:  Constitutional: No Weight Change, No Fever ENT/Mouth: No sore throat, No Rhinorrhea Eyes: No Eye Pain, No Vision Changes Cardiovascular: + Chest Pain, + SOB, Respiratory: No Cough, No Sputum, No Wheezing, no Dyspnea  Gastrointestinal: No Nausea, No Vomiting, No Diarrhea, No Constipation, No Pain Genitourinary: no Urinary Incontinence, No Urgency, No Flank Pain Musculoskeletal: No Arthralgias, No Myalgias Skin: No Skin Lesions, No Pruritus, Neuro: no Weakness, No Numbness,  No Loss of Consciousness, No Syncope Psych: No Anxiety/Panic, No Depression, no decrease appetite Heme/Lymph: No Bruising, No Bleeding  Past Medical History:  Diagnosis Date  . Anemia   . Anxiety   . Depression   . Hypertension   . Migraine    In past    Past Surgical History:  Procedure Laterality Date  . CHOLECYSTECTOMY    . COLONOSCOPY WITH PROPOFOL N/A 05/27/2017   Procedure: COLONOSCOPY WITH PROPOFOL;  Surgeon: Lin Landsman, MD;  Location: [facility] ENDOSCOPY;  Service: Gastroenterology;  Laterality: N/A;  . DILATION AND CURETTAGE OF UTERUS  2003   2 Miscarriages  . DILITATION & CURRETTAGE/HYSTROSCOPY WITH NOVASURE ABLATION N/A 12/16/2015   Procedure: DILATATION & CURETTAGE/HYSTEROSCOPY WITH NOVASURE ABLATION;  Surgeon: Brayton Mars, MD;  Location: [facility] ORS;  Service: Gynecology;  Laterality: N/A;  . GALLBLADDER SURGERY  2003  . GASTRIC BYPASS  2004  . GASTRIC BYPASS    . HERNIA REPAIR  2005  . HERNIA REPAIR  reports that she has never smoked. She has never used smokeless tobacco. She reports that she does not drink alcohol or use drugs.  No Known Allergies  Family History  Adopted: Yes  Family history unknown: Yes     Prior to Admission medications   Medication Sig Start Date End Date Taking? Authorizing Provider  amLODipine (NORVASC) 2.5 MG tablet Take 1 tablet (2.5 mg total) by mouth daily.  11/22/18  Yes Karamalegos, Alexander J, DO  cyanocobalamin (,VITAMIN B-12,) 1000 MCG/ML injection ADMINISTER 1 ML(1000 MCG) IN THE MUSCLE EVERY 30 DAYS Patient taking differently: Inject 1,000 mcg into the muscle every 30 (thirty) days.  11/10/18  Yes Cammie Sickle, MD  SUMAtriptan (IMITREX) 50 MG tablet Take 1-2 tablets (50-100 mg total) by mouth once as needed for up to 1 dose for migraine. May repeat one dose in 2 hours if headache persists, for max dose 24 hours 11/22/18  Yes Olin Hauser, DO    Physical Exam: Vitals:   03/09/19 1959 03/09/19 1959 03/09/19 2057 03/09/19 2136  BP: (!) 146/92 (!) 165/95 (!) 138/94 (!) 149/92  Pulse: 72 72  73  Resp:      Temp:    98.7 F (37.1 C)  TempSrc:    Oral  SpO2:   99% 97%  Weight:    (P) 85 kg  Height:    (P) 5\' 3"  (1.6 m)    Constitutional: NAD, calm, comfortable, nontoxic well-appearing female sitting upright in bed Vitals:   03/09/19 1959 03/09/19 1959 03/09/19 2057 03/09/19 2136  BP: (!) 146/92 (!) 165/95 (!) 138/94 (!) 149/92  Pulse: 72 72  73  Resp:      Temp:    98.7 F (37.1 C)  TempSrc:    Oral  SpO2:   99% 97%  Weight:    (P) 85 kg  Height:    (P) 5\' 3"  (1.6 m)   Eyes: PERRL, lids and conjunctivae normal ENMT: Mucous membranes are moist. Posterior pharynx clear of any exudate or lesions..  Neck: normal, supple, no masses Respiratory: clear to auscultation bilaterally, no wheezing, no crackles. Normal respiratory effort. No accessory muscle use.  Cardiovascular: Regular rate and with frequent PVCs seen on telemetry, no murmurs / rubs / gallops. No extremity edema. 2+ pedal pulses.  Mild tenderness to palpation of the midsternal chest wall  abdomen: no tenderness, no masses palpated.  Bowel sounds positive.  Musculoskeletal: no clubbing / cyanosis. No joint deformity upper and lower extremities. Good ROM, no contractures. Normal muscle tone.  Skin: no rashes, lesions, ulcers. No induration Neurologic: CN 2-12  grossly intact. Sensation intact Strength 5/5 in all 4.  Psychiatric: Normal judgment and insight. Alert and oriented x 3. Normal mood.     Labs on Admission: I have personally reviewed following labs and imaging studies  CBC: Recent Labs  Lab 03/09/19 1435  WBC 5.5  HGB 15.0  HCT 42.4  MCV 88.7  PLT 0000000   Basic Metabolic Panel: Recent Labs  Lab 03/09/19 1435 03/09/19 1905  NA 138  --   K 4.0  --   CL 105  --   CO2 25  --   GLUCOSE 88  --   BUN 11  --   CREATININE 0.72  --   CALCIUM 9.0  --   MG  --  2.0   GFR: Estimated Creatinine Clearance: 86.3 mL/min (by C-G formula based on SCr of 0.72 mg/dL). Liver Function Tests: No results for input(s): AST, ALT,  ALKPHOS, BILITOT, PROT, ALBUMIN in the last 168 hours. No results for input(s): LIPASE, AMYLASE in the last 168 hours. No results for input(s): AMMONIA in the last 168 hours. Coagulation Profile: No results for input(s): INR, PROTIME in the last 168 hours. Cardiac Enzymes: No results for input(s): CKTOTAL, CKMB, CKMBINDEX, TROPONINI in the last 168 hours. BNP (last 3 results) No results for input(s): PROBNP in the last 8760 hours. HbA1C: No results for input(s): HGBA1C in the last 72 hours. CBG: No results for input(s): GLUCAP in the last 168 hours. Lipid Profile: No results for input(s): CHOL, HDL, LDLCALC, TRIG, CHOLHDL, LDLDIRECT in the last 72 hours. Thyroid Function Tests: Recent Labs    03/09/19 1435  TSH 0.479   Anemia Panel: No results for input(s): VITAMINB12, FOLATE, FERRITIN, TIBC, IRON, RETICCTPCT in the last 72 hours. Urine analysis: No results found for: COLORURINE, APPEARANCEUR, [place], [place], [place], [place], [place], [place], [place], [place], NITRITE, LEUKOCYTESUR  Radiological Exams on Admission: Dg Chest 2 View  Result Date: 03/09/2019 CLINICAL DATA:  Chest pain and shortness of breath EXAM: CHEST - 2 VIEW COMPARISON:  June 21, 2017 FINDINGS: Lungs are clear. Heart  size and pulmonary vascularity are normal. No adenopathy. No pneumothorax. No bone lesions. There are surgical clips in the right upper abdomen. IMPRESSION: No edema or consolidation. Electronically Signed   By: Lowella Grip III M.D.   On: 03/09/2019 14:48    EKG: Independently reviewed.   Assessment/Plan Chest pain -EKG with normal sinus rhythm and frequent PVCs -TSH, magnesium is normal -Continue to trend troponin -Obtain echocardiogram -Morphine PRN for pain -Consult cardiology to evaluate in the morning  Hypertension -Continue amlodipine  Obesity - BMI of 33 - Discussed weight management and pt has been working with PCP on diet and exercise   DVT prophylaxis:Lovenox Code Status: Full Family Communication: Plan discussed with patient at bedside  disposition Plan: Home with observation Consults called: Cardiology Admission status: Observation  Hawkin Charo T Ankit Degregorio DO [facility]   If 7PM-7AM, please contact night-coverage [email or web address] Password [email or web address]  03/09/2019, 11:52 PM

## 2019-03-09 NOTE — ED Notes (Signed)
Attempted to call report to 2A, on hold x 5 minutes. Will call back

## 2019-03-09 NOTE — ED Notes (Signed)
Hold labetolol per Dr. Ellender Hose. BP 160/97 HR 72

## 2019-03-09 NOTE — ED Notes (Signed)
Pt placed on 5 lead monitor for continuous monitoring.

## 2019-03-09 NOTE — ED Notes (Signed)
Pt rang call bell, when this RN arrived, pt was sitting forward in bed, appearing anxious and SOB. Dr Ellender Hose called to room Repeat EKG performed. Pt states pain 8/10 in central chest. Pain radiating to both shoulders. Dr. Ellender Hose aware. See current vitals

## 2019-03-09 NOTE — ED Notes (Signed)
ED TO INPATIENT HANDOFF REPORT  ED Nurse Name and Phone #: Gwenette Greet [phone]  S Name/Age/Gender Crystal Levine 53 y.o. female Room/Bed: ED45A/ED45A  Code Status   Code Status: Full Code  Home/SNF/Other Home Patient oriented to: self, place, time and situation Is this baseline? Yes   Triage Complete: Triage complete  Chief Complaint chest pain  Triage Note C/O chest pain, diaphoresis, right arm pain.  Onset yesterday.  Seen through Urgent Care, who referred patient to ED due to 'abnormal EKG'.  Patient is AAOx3.  Skin warm and dry. NAD   Allergies No Known Allergies  Level of Care/Admitting Diagnosis ED Disposition    ED Disposition Condition [facility] Area: [place] [[id number]]  Level of Care: Telemetry [5]  Covid Evaluation: Asymptomatic Screening Protocol (No Symptoms)  Diagnosis: Chest pain [id number]  Admitting Physician: Orene Desanctis [id number]  Attending Physician: Orene Desanctis [id number]  PT Class (Do Not Modify): Observation [104]  PT Acc Code (Do Not Modify): Observation [10022]       B Medical/Surgery History Past Medical History:  Diagnosis Date  . Anemia   . Anxiety   . Depression   . Hypertension   . Migraine    In past   Past Surgical History:  Procedure Laterality Date  . CHOLECYSTECTOMY    . COLONOSCOPY WITH PROPOFOL N/A 05/27/2017   Procedure: COLONOSCOPY WITH PROPOFOL;  Surgeon: Lin Landsman, MD;  Location: [facility] ENDOSCOPY;  Service: Gastroenterology;  Laterality: N/A;  . DILATION AND CURETTAGE OF UTERUS  2003   2 Miscarriages  . DILITATION & CURRETTAGE/HYSTROSCOPY WITH NOVASURE ABLATION N/A 12/16/2015   Procedure: DILATATION & CURETTAGE/HYSTEROSCOPY WITH NOVASURE ABLATION;  Surgeon: Brayton Mars, MD;  Location: [facility] ORS;  Service: Gynecology;  Laterality: N/A;  . GALLBLADDER SURGERY  2003  . GASTRIC BYPASS  2004  . GASTRIC BYPASS    . HERNIA REPAIR  2005  . HERNIA REPAIR       A IV  Location/Drains/Wounds Patient Lines/Drains/Airways Status   Active Line/Drains/Airways    Name:   Placement date:   Placement time:   Site:   Days:   Peripheral IV 03/09/19 Right Antecubital   03/09/19    1647    Antecubital   less than 1   Airway   05/27/17    0943     651   Incision (Closed) 12/16/15 Vagina   12/16/15    0956     1179          Intake/Output Last 24 hours No intake or output data in the 24 hours ending 03/09/19 2059  Labs/Imaging Results for orders placed or performed during the hospital encounter of 03/09/19 (from the past 48 hour(s))  Basic metabolic panel     Status: None   Collection Time: 03/09/19  2:35 PM  Result Value Ref Range   Sodium 138 135 - 145 mmol/L   Potassium 4.0 3.5 - 5.1 mmol/L   Chloride 105 98 - 111 mmol/L   CO2 25 22 - 32 mmol/L   Glucose, Bld 88 70 - 99 mg/dL   BUN 11 6 - 20 mg/dL   Creatinine, Ser 0.72 0.44 - 1.00 mg/dL   Calcium 9.0 8.9 - 10.3 mg/dL   GFR calc non Af Amer >60 >60 mL/min   GFR calc Af Amer >60 >60 mL/min   Anion gap 8 5 - 15    Comment: Performed at [facility], [address]  [place], [place] [address]  CBC     Status: None   Collection Time: 03/09/19  2:35 PM  Result Value Ref Range   WBC 5.5 4.0 - 10.5 K/uL   RBC 4.78 3.87 - 5.11 MIL/uL   Hemoglobin 15.0 12.0 - 15.0 g/dL   HCT 42.4 36.0 - 46.0 %   MCV 88.7 80.0 - 100.0 fL   MCH 31.4 26.0 - 34.0 pg   MCHC 35.4 30.0 - 36.0 g/dL   RDW 12.4 11.5 - 15.5 %   Platelets 181 150 - 400 K/uL   nRBC 0.0 0.0 - 0.2 %    Comment: Performed at [facility], [place], [place] [address]  Troponin I (High Sensitivity)     Status: None   Collection Time: 03/09/19  2:35 PM  Result Value Ref Range   Troponin I (High Sensitivity) <2 <18 ng/L    Comment: (NOTE) Elevated high sensitivity troponin I (hsTnI) values and significant  changes across serial measurements may suggest ACS but many other  chronic and acute conditions are known to  elevate hsTnI results.  Refer to the "Links" section for chest pain algorithms and additional  guidance. Performed at [facility], [address] [place], [place] [address]   TSH     Status: None   Collection Time: 03/09/19  2:35 PM  Result Value Ref Range   TSH 0.479 0.350 - 4.500 uIU/mL    Comment: Performed by a 3rd Generation assay with a functional sensitivity of <=0.01 uIU/mL. Performed at [facility], [place], [place] [address]   Troponin I (High Sensitivity)     Status: None   Collection Time: 03/09/19  4:53 PM  Result Value Ref Range   Troponin I (High Sensitivity) 2 <18 ng/L    Comment: (NOTE) Elevated high sensitivity troponin I (hsTnI) values and significant  changes across serial measurements may suggest ACS but many other  chronic and acute conditions are known to elevate hsTnI results.  Refer to the "Links" section for chest pain algorithms and additional  guidance. Performed at [facility], [address] [place], [place] [address]   Magnesium     Status: None   Collection Time: 03/09/19  7:05 PM  Result Value Ref Range   Magnesium 2.0 1.7 - 2.4 mg/dL    Comment: Performed at [facility], [address] [place], [place] [address]   Dg Chest 2 View  Result Date: 03/09/2019 CLINICAL DATA:  Chest pain and shortness of breath EXAM: CHEST - 2 VIEW COMPARISON:  June 21, 2017 FINDINGS: Lungs are clear. Heart size and pulmonary vascularity are normal. No adenopathy. No pneumothorax. No bone lesions. There are surgical clips in the right upper abdomen. IMPRESSION: No edema or consolidation. Electronically Signed   By: Lowella Grip III M.D.   On: 03/09/2019 14:48    Pending Labs Unresulted Labs (From admission, onward)    Start     Ordered   03/10/19 0500  HIV Antibody (routine testing w rflx)  Once,   R     03/10/19 0500   03/09/19 1924  SARS CORONAVIRUS 2 (TAT 6-24 HRS) Nasopharyngeal Nasopharyngeal  Swab  (Asymptomatic/Tier 3)  Once,   STAT    Question Answer Comment  Is this test for diagnosis or screening Screening   Symptomatic for COVID-19 as defined by CDC No   Hospitalized for COVID-19 No   Admitted to ICU for COVID-19 No   Previously tested  for COVID-19 No   Resident in a congregate (group) care setting No   Employed in healthcare setting No   Pregnant No      03/09/19 1923          Vitals/Pain Today's Vitals   03/09/19 1923 03/09/19 1959 03/09/19 1959 03/09/19 2057  BP: (!) 160/97 (!) 146/92 (!) 165/95 (!) 138/94  Pulse:  72 72   Resp:      Temp:      TempSrc:      SpO2: 98%   99%  Weight:      Height:      PainSc:    6     Isolation Precautions No active isolations  Medications Medications  acetaminophen (TYLENOL) tablet 650 mg (has no administration in time range)  ondansetron (ZOFRAN) injection 4 mg (has no administration in time range)  enoxaparin (LOVENOX) injection 40 mg (has no administration in time range)  morphine 2 MG/ML injection 2 mg (has no administration in time range)  sodium chloride flush (NS) 0.9 % injection 3 mL (3 mLs Intravenous Given 03/09/19 1751)  morphine 4 MG/ML injection 4 mg (4 mg Intravenous Given 03/09/19 1751)  ondansetron (ZOFRAN) injection 4 mg (4 mg Intravenous Given 03/09/19 1751)  aspirin chewable tablet 324 mg (324 mg Oral Given 03/09/19 1841)    Mobility walks Low fall risk   Focused Assessments Cardiac Assessment Handoff:    Lab Results  Component Value Date   TROPONINI <0.03 06/21/2017   No results found for: DDIMER Does the Patient currently have chest pain? Yes     R Recommendations: See Admitting Provider Note  Report given to:   Additional Notes: ASA given, chest pain decreased

## 2019-03-09 NOTE — ED Triage Notes (Signed)
C/O chest pain, diaphoresis, right arm pain.  Onset yesterday.  Seen through Urgent Care, who referred patient to ED due to 'abnormal EKG'.  Patient is AAOx3.  Skin warm and dry. NAD

## 2019-03-09 NOTE — ED Notes (Signed)
EDP in room discussing plan of care for pt.

## 2019-03-10 ENCOUNTER — Observation Stay (HOSPITAL_BASED_OUTPATIENT_CLINIC_OR_DEPARTMENT_OTHER)
Admit: 2019-03-10 | Discharge: 2019-03-10 | Disposition: A | Discharge status: Home / Self Care | Payer: Managed Care, Other (non HMO) | Attending: Family Medicine | Admitting: Family Medicine

## 2019-03-10 DIAGNOSIS — I361 Nonrheumatic tricuspid (valve) insufficiency: Secondary | ICD-10-CM | POA: Diagnosis not present

## 2019-03-10 DIAGNOSIS — I160 Hypertensive urgency: Secondary | ICD-10-CM | POA: Diagnosis not present

## 2019-03-10 DIAGNOSIS — R079 Chest pain, unspecified: Secondary | ICD-10-CM

## 2019-03-10 DIAGNOSIS — F419 Anxiety disorder, unspecified: Secondary | ICD-10-CM | POA: Diagnosis not present

## 2019-03-10 DIAGNOSIS — I10 Essential (primary) hypertension: Secondary | ICD-10-CM

## 2019-03-10 DIAGNOSIS — F329 Major depressive disorder, single episode, unspecified: Secondary | ICD-10-CM

## 2019-03-10 LAB — ECHOCARDIOGRAM COMPLETE
Height: 63 in
Weight: 3000 oz

## 2019-03-10 LAB — SARS CORONAVIRUS 2 (TAT 6-24 HRS): SARS Coronavirus 2: NEGATIVE

## 2019-03-10 MED ORDER — AMLODIPINE BESYLATE 5 MG PO TABS
2.5000 mg | ORAL_TABLET | Freq: Every day | ORAL | Status: DC
  Administered 2019-03-10: 2.5 mg via ORAL
  Filled 2019-03-10: qty 1

## 2019-03-10 MED ORDER — AMLODIPINE BESYLATE 5 MG PO TABS: 5.0000 mg | ORAL_TABLET | Freq: Every day | ORAL | Status: DC

## 2019-03-10 MED ORDER — BUSPIRONE HCL 7.5 MG PO TABS: 7.5000 mg | ORAL_TABLET | Freq: Three times a day (TID) | ORAL | 0 refills | Status: DC

## 2019-03-10 MED ORDER — BUTALBITAL-APAP-CAFFEINE 50-325-40 MG PO TABS
1.0000 | ORAL_TABLET | Freq: Four times a day (QID) | ORAL | Status: DC | PRN
  Administered 2019-03-10: 1 via ORAL
  Filled 2019-03-10: qty 1

## 2019-03-10 MED ORDER — AMLODIPINE BESYLATE 5 MG PO TABS: 5.0000 mg | ORAL_TABLET | Freq: Every day | ORAL | 0 refills | Status: DC

## 2019-03-10 NOTE — Progress Notes (Signed)
Reviewed discharge instructions with patient and patient verbalized understanding. No questions at this time. Husband sitting at beside will be driving patient home.

## 2019-03-10 NOTE — Discharge Summary (Signed)
Physician Discharge Summary  Crystal Levine [id number] DOB: 26-Dec-1965 DOA: 03/09/2019  PCP: Olin Hauser, DO  Admit date: 03/09/2019 Discharge date: 03/10/2019  Admitted From: Home Disposition:  Home  Recommendations for Outpatient Follow-up:  1. Follow up with PCP in 1-2 weeks 2. Please obtain BMP/CBC in one week 3. Monitor blood pressure at home, call PCP if persistently elevated  4. Follow up with cardiology in 2-4 weeks  Home Health: No Equipment/Devices: None  Discharge Condition: Stable  CODE STATUS: Full  Diet recommendation: Heart Healthy (low sodium)  Brief/Interim Summary:  53 y.o. female with medical history of gastric bypass with subsequent iron deficiency anemia requiring IV iron transfusion, migraine headache, hypertension and anxiety who presents with concerns of chest pain associated with shortness of breath, palpitations and diaphoresis.  Took her blood pressure at home, it was 176/111, prompting her to come to the ED.   In the ED, patient had blood pressures elevated to 180s over 100s, normal heart rates, and frequent PVCs on telemetry that seemed to correlate with increase in her chest pain.  Cardiology was consulted by ED physician, initially was recommended for her to be discharged home with close outpatient follow-up, however patient apparently had a near syncopal episode and was therefore admitted for observation and further evaluation.  Full dose aspirin and morphine in the ED.  Troponins negative x4.  EKG without ischemic changes.  Echo was obtained report is pending at this time.  Chest pain resolved and no further PVCs seen on telemetry with control of blood pressure.  Patient attributes her symptoms and her elevated blood pressure to significant stress and anxiety in personal and work life, recently.  Discussed with patient and husband at bedside this afternoon, yet they request discharge home at this time with close outpatient cardiology and  primary care follow-ups.  Patient was started on low-dose BuSpar for anxiety, and home dose of amlodipine increased from 2.5 to 5 mg.  Patient agrees to monitor blood pressure at home once or twice a day, was advised to follow low-sodium diet, and try to exercise on a regular basis to help with stress.  Patient is clinically improved and hemodynamically stable for discharge home this afternoon    Discharge Diagnoses: Active Problems:   Essential hypertension   Obesity (BMI 30.0-34.9)   Chest pain    Discharge Instructions   Discharge Instructions    Call Levine for:   Complete by: As directed    Chest pain, palpitations/fluttering sensation, uncontrolled blood pressure   Diet - low sodium heart healthy   Complete by: As directed    Discharge instructions   Complete by: As directed    Monitor your blood pressure at home, at least once or twice per day, different times of day. However, TRY not to focus on it or worry about it too much, because that only makes it go up even more. If you see multiple readings that are higher than 140/90, please call your primary care doctor.  I am starting Buspar (buspirone) for anxiety.  This is a scheduled medicine (not as needed), you take twice daily.  I started at a low dose which your primary care doctor can increase if necessary.    I will ask to get you set up to see Cardiologist as outpatient to follow up on this hospital admission and determine if any further testing is needed.   Increase activity slowly   Complete by: As directed      Allergies as of  03/10/2019   No Known Allergies     Medication List    TAKE these medications   amLODipine 5 MG tablet Commonly known as: NORVASC Take 1 tablet (5 mg total) by mouth daily. Start taking on: March 11, 2019 What changed:   medication strength  how much to take   busPIRone 7.5 MG tablet Commonly known as: BUSPAR Take 1 tablet (7.5 mg total) by mouth 3 (three) times daily.    cyanocobalamin 1000 MCG/ML injection Commonly known as: (VITAMIN B-12) ADMINISTER 1 ML(1000 MCG) IN THE MUSCLE EVERY 30 DAYS What changed: See the new instructions.   SUMAtriptan 50 MG tablet Commonly known as: IMITREX Take 1-2 tablets (50-100 mg total) by mouth once as needed for up to 1 dose for migraine. May repeat one dose in 2 hours if headache persists, for max dose 24 hours      Follow-up Information    Crystal Levine Follow up in 2 week(s).   Specialty: Cardiology Contact information: [place] [address] [phone]        Olin Hauser, DO Follow up in 1 week(s).   Specialty: Family Medicine Contact information: [place]  [address] [phone]          No Known Allergies  Consultations:  None   Procedures/Studies: Dg Chest 2 View  Result Date: 03/09/2019 CLINICAL DATA:  Chest pain and shortness of breath EXAM: CHEST - 2 VIEW COMPARISON:  June 21, 2017 FINDINGS: Lungs are clear. Heart size and pulmonary vascularity are normal. No adenopathy. No pneumothorax. No bone lesions. There are surgical clips in the right upper abdomen. IMPRESSION: No edema or consolidation. Electronically Signed   By: Crystal Grip III M.D.   On: 03/09/2019 14:48      Echo 11/20, report pending   Subjective: Patient seen and examined, husband at bedside.  No acute events reported overnight.  Patient reports chest pain is resolved since blood pressure controlled.  States no longer feeling palpitations.  States she thinks her elevated blood pressure due to significant life stressors, and she states plans on significant lifestyle changes to help with this.  Denies fevers, chills or other acute complaints at this time.  Requests discharge home this afternoon and agrees she will closely follow-up with cardiology and primary care.   Discharge Exam: Vitals:   03/10/19 0731 03/10/19 1524  BP: 116/78 110/73  Pulse: 65    Resp: 19   Temp: 98.6 F (37 C)   SpO2: 99%    Vitals:   03/09/19 2136 03/10/19 0411 03/10/19 0731 03/10/19 1524  BP: (!) 149/92 109/63 116/78 110/73  Pulse: 73 74 65   Resp:   19   Temp: 98.7 F (37.1 C) 98.7 F (37.1 C) 98.6 F (37 C)   TempSrc: Oral Oral Oral   SpO2: 97% 97% 99%   Weight: 85 kg 85 kg    Height: 5\' 3"  (1.6 m)       General: Pt is alert, awake, not in acute distress Cardiovascular: RRR, S1/S2 +, no rubs, no gallops Respiratory: CTA bilaterally, no wheezing, no rhonchi Abdominal: Soft, NT, ND, bowel sounds + Extremities: no edema, no cyanosis    The results of significant diagnostics from this hospitalization (including imaging, microbiology, ancillary and laboratory) are listed below for reference.     Microbiology: Recent Results (from the past 240 hour(s))  SARS CORONAVIRUS 2 (TAT 6-24 HRS) Nasopharyngeal Nasopharyngeal Swab     Status:  None   Collection Time: 03/09/19  7:29 PM   Specimen: Nasopharyngeal Swab  Result Value Ref Range Status   SARS Coronavirus 2 NEGATIVE NEGATIVE Final    Comment: (NOTE) SARS-CoV-2 target nucleic acids are NOT DETECTED. The SARS-CoV-2 RNA is generally detectable in upper and lower respiratory specimens during the acute phase of infection. Negative results do not preclude SARS-CoV-2 infection, do not rule out co-infections with other pathogens, and should not be used as the sole basis for treatment or other patient management decisions. Negative results must be combined with clinical observations, patient history, and epidemiological information. The expected result is Negative. Fact Sheet for Patients: SugarRoll.be Fact Sheet for Healthcare Providers: https://www.woods-mathews.com/ This test is not yet approved or cleared by the [place] FDA and  has been authorized for detection and/or diagnosis of SARS-CoV-2 by FDA under an Emergency Use Authorization (EUA). This  EUA will remain  in effect (meaning this test can be used) for the duration of the COVID-19 declaration under Section 56 4(b)(1) of the Act, 21 U.S.C. section 360bbb-3(b)(1), unless the authorization is terminated or revoked sooner. Performed at [facility], [place] [address], [place],  [address]      Labs: BNP (last 3 results) No results for input(s): BNP in the last 8760 hours. Basic Metabolic Panel: Recent Labs  Lab 03/09/19 1435 03/09/19 1905  NA 138  --   K 4.0  --   CL 105  --   CO2 25  --   GLUCOSE 88  --   BUN 11  --   CREATININE 0.72  --   CALCIUM 9.0  --   MG  --  2.0   Liver Function Tests: No results for input(s): AST, ALT, ALKPHOS, BILITOT, PROT, ALBUMIN in the last 168 hours. No results for input(s): LIPASE, AMYLASE in the last 168 hours. No results for input(s): AMMONIA in the last 168 hours. CBC: Recent Labs  Lab 03/09/19 1435  WBC 5.5  HGB 15.0  HCT 42.4  MCV 88.7  PLT 181   Cardiac Enzymes: No results for input(s): CKTOTAL, CKMB, CKMBINDEX, TROPONINI in the last 168 hours. BNP: Invalid input(s): POCBNP CBG: No results for input(s): GLUCAP in the last 168 hours. D-Dimer No results for input(s): DDIMER in the last 72 hours. Hgb A1c No results for input(s): HGBA1C in the last 72 hours. Lipid Profile No results for input(s): CHOL, HDL, LDLCALC, TRIG, CHOLHDL, LDLDIRECT in the last 72 hours. Thyroid function studies Recent Labs    03/09/19 1435  TSH 0.479   Anemia work up No results for input(s): VITAMINB12, FOLATE, FERRITIN, TIBC, IRON, RETICCTPCT in the last 72 hours. Urinalysis No results found for: COLORURINE, APPEARANCEUR, [place], [place], [place], [place], [place], [place], PROTEINUR, UROBILINOGEN, NITRITE, LEUKOCYTESUR Sepsis Labs Invalid input(s): PROCALCITONIN,  WBC,  LACTICIDVEN Microbiology Recent Results (from the past 240 hour(s))  SARS CORONAVIRUS 2 (TAT 6-24 HRS) Nasopharyngeal Nasopharyngeal Swab      Status: None   Collection Time: 03/09/19  7:29 PM   Specimen: Nasopharyngeal Swab  Result Value Ref Range Status   SARS Coronavirus 2 NEGATIVE NEGATIVE Final    Comment: (NOTE) SARS-CoV-2 target nucleic acids are NOT DETECTED. The SARS-CoV-2 RNA is generally detectable in upper and lower respiratory specimens during the acute phase of infection. Negative results do not preclude SARS-CoV-2 infection, do not rule out co-infections with other pathogens, and should not be used as the sole basis for treatment or other patient management decisions. Negative results must be combined  with clinical observations, patient history, and epidemiological information. The expected result is Negative. Fact Sheet for Patients: SugarRoll.be Fact Sheet for Healthcare Providers: https://www.woods-mathews.com/ This test is not yet approved or cleared by the [place] FDA and  has been authorized for detection and/or diagnosis of SARS-CoV-2 by FDA under an Emergency Use Authorization (EUA). This EUA will remain  in effect (meaning this test can be used) for the duration of the COVID-19 declaration under Section 56 4(b)(1) of the Act, 21 U.S.C. section 360bbb-3(b)(1), unless the authorization is terminated or revoked sooner. Performed at [facility], [place] [address], [place], [place] [address]      Time coordinating discharge: Over 30 minutes  SIGNED:   Ezekiel Slocumb, DO [facility] 03/10/2019, 3:24 PM Pager [phone]  If 7PM-7AM, please contact night-coverage [email or web address] Password [email or web address]

## 2019-03-10 NOTE — Progress Notes (Signed)
Cleona visited with pt to complete AD education. Pt is a 53 y.o. female that has been dealing with stress related to her job. 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 Pt shares that she enjoys her work but it has been overwhelming since she now telecommutes and the COVID-19 restrictions. Ch allowed space for the pt to lament about her current job role and how she realizes that this recent health scare was a wake up call for her to make some needed changes. Ch encouraged pt to find ways to integrate breaks and self care throughout her day as she works. Ch supported the pt's beliefs by paraphrasing scripture, praying for her, and allowing her to feel comforted and encouraged.  Ch will f/u with the pt at a later time to see if she would like to finalize the AD.    03/10/19 1000  Clinical Encounter Type  Visited With Patient  Visit Type Social support;Spiritual support;Other (Comment) (AD edu)  Referral From Nurse  Consult/Referral To Chaplain  Spiritual Encounters  Spiritual Needs Emotional;Grief support;Prayer;Brochure  Stress Factors  Patient Stress Factors Health changes;Loss of control;Major life changes;Exhausted  Family Stress Factors Major life changes  Advance Directives (For Healthcare)  Does Patient Have a Medical Advance Directive? No  Would patient like information on creating a medical advance directive? Yes (Inpatient - patient defers creating a medical advance directive at this time - Information given)

## 2019-03-20 ENCOUNTER — Ambulatory Visit (INDEPENDENT_AMBULATORY_CARE_PROVIDER_SITE_OTHER): Payer: Managed Care, Other (non HMO) | Admitting: Cardiology

## 2019-03-20 ENCOUNTER — Other Ambulatory Visit: Payer: Self-pay

## 2019-03-20 ENCOUNTER — Encounter: Payer: Self-pay | Admitting: Cardiology

## 2019-03-20 ENCOUNTER — Encounter

## 2019-03-20 ENCOUNTER — Ambulatory Visit (INDEPENDENT_AMBULATORY_CARE_PROVIDER_SITE_OTHER): Payer: Managed Care, Other (non HMO)

## 2019-03-20 VITALS — BP 130/90 | HR 62 | Temp 97.3°F | Ht 63.0 in | Wt 191.0 lb

## 2019-03-20 DIAGNOSIS — I10 Essential (primary) hypertension: Secondary | ICD-10-CM | POA: Diagnosis not present

## 2019-03-20 DIAGNOSIS — R002 Palpitations: Secondary | ICD-10-CM

## 2019-03-20 DIAGNOSIS — R072 Precordial pain: Secondary | ICD-10-CM

## 2019-03-20 NOTE — Progress Notes (Signed)
Cardiology Office Note:    Date:  03/20/2019   ID:  Crystal Levine, DOB 15-Oct-1965, MRN [id number]  PCP:  Olin Hauser, DO  Cardiologist:  No primary care provider on file.  Electrophysiologist:  None   Referring MD: [name] *   Chief Complaint  Patient presents with  . New Patient (Initial Visit)    Hospital F/U-abnomal EKG, palpitations, chest pain, and elevated BP; Meds verbally reviewed with patient.    Crystal Levine is a 53 y.o. female who is being seen today for the evaluation of chest pain, palpitations at the request of [name] *DO   History of Present Illness:    Crystal Levine is a 53 y.o. female with a hx of anxiety, hypertension, prior gastric bypass with subsequent iron deficiency anemia, anxiety who presents due to chest pain.  Patient states having chest pain for about 12 days now.  Symptoms are not related with exertion and associated with palpitations.  She sometimes have palpitations without chest pains.  Palpitations usually last for couple of minutes and have occurred daily for the past 10 days.  She presented to the emergency room 10 days ago due to severe chest pain, which she rates as an 8 out of 10. EKG showed sinus rhythm with frequent PVCs, no ST changes.  High-sensitivity troponins were within normal limits.  She was hypertensive with systolic blood pressure in the 180s.  Her amlodipine was increased to 5 mg.  Echocardiogram obtained in the emergency room showed normal ejection fraction, with impaired relaxation.  Patient was then discharged after observation overnight and advised to follow-up with cardiology.  She states being under a lot of stress of late.  She denies any history of cardiac disease.  She does not know her family history due to being adopted but was told by her biological mother that her dad had a cardiac disease.  Past Medical History:  Diagnosis Date  . Anemia   . Anxiety   . Depression   .  Hypertension   . Migraine    In past    Past Surgical History:  Procedure Laterality Date  . CHOLECYSTECTOMY    . COLONOSCOPY WITH PROPOFOL N/A 05/27/2017   Procedure: COLONOSCOPY WITH PROPOFOL;  Surgeon: Lin Landsman, MD;  Location: [facility] END[facility]COPY;  Service: Gastroenterology;  Laterality: N/A;  . DILATION AND CURETTAGE OF UTERUS  2003   2 Miscarriages  . DILITATION & CURRETTAGE/HYSTROSCOPY WITH NOVASURE ABLATION N/A 12/16/2015   Procedure: DILATATION & CURETTAGE/HYSTEROSCOPY WITH NOVASURE ABLATION;  Surgeon: Brayton Mars, MD;  Location: [facility] ORS;  Service: Gynecology;  Laterality: N/A;  . GALLBLADDER SURGERY  2003  . GASTRIC BYPASS  2004  . GASTRIC BYPASS    . HERNIA REPAIR  2005  . HERNIA REPAIR      Current Medications: Current Meds  Medication Sig  . amLODipine (NORVASC) 5 MG tablet Take 1 tablet (5 mg total) by mouth daily.  . busPIRone (BUSPAR) 7.5 MG tablet Take 1 tablet (7.5 mg total) by mouth 3 (three) times daily.  . cyanocobalamin (,VITAMIN B-12,) 1000 MCG/ML injection ADMINISTER 1 ML(1000 MCG) IN THE MUSCLE EVERY 30 DAYS (Patient taking differently: Inject 1,000 mcg into the muscle every 30 (thirty) days. )  . SUMAtriptan (IMITREX) 50 MG tablet Take 1-2 tablets (50-100 mg total) by mouth once as needed for up to 1 dose for migraine. May repeat one dose in 2 hours if headache persists, for max dose 24 hours  Allergies:   Patient has no known allergies.   Social History   Socioeconomic History  . Marital status: Married    Spouse name: Not on file  . Number of children: Not on file  . Years of education: Not on file  . Highest education level: Not on file  Occupational History  . Not on file  Social Needs  . Financial resource strain: Not on file  . Food insecurity    Worry: Not on file    Inability: Not on file  . Transportation needs    Medical: Not on file    Non-medical: Not on file  Tobacco Use  . Smoking status: Never Smoker  .  Smokeless tobacco: Never Used  Substance and Sexual Activity  . Alcohol use: No  . Drug use: No  . Sexual activity: Yes    Comment: vasectomy  Lifestyle  . Physical activity    Days per week: Not on file    Minutes per session: Not on file  . Stress: Not on file  Relationships  . Social Herbalist on phone: Not on file    Gets together: Not on file    Attends religious service: Not on file    Active member of club or organization: Not on file    Attends meetings of clubs or organizations: Not on file    Relationship status: Not on file  Other Topics Concern  . Not on file  Social History Narrative   ** Merged History Encounter **         Family History: The patient's She was adopted. Family history is unknown by patient.  ROS:   Please see the history of present illness.     All other systems reviewed and are negative.  EKGs/Labs/Other Studies Reviewed:    The following studies were reviewed today: TTE 03-15-2019 1. Left ventricular ejection fraction, by visual estimation, is 60 to 65%. The left ventricle has normal function. There is no left ventricular hypertrophy.  2. Left ventricular diastolic parameters are consistent with Grade I diastolic dysfunction (impaired relaxation).  3. Global right ventricle has normal systolic function.The right ventricular size is normal. No increase in right ventricular wall thickness.  4. Left atrial size was normal.  5. Mild mitral valve regurgitation.  6. Tricuspid valve regurgitation is mild.  7. Aortic valve regurgitation is mild.  8. Normal pulmonary artery systolic pressure.  EKG:  EKG is  ordered today.  The ekg ordered today demonstrates normal sinus rhythm, possible LVH per voltage criteria.  Recent Labs: 02/13/2019: ALT 12 03/09/2019: BUN 11; Creatinine, Ser 0.72; Hemoglobin 15.0; Magnesium 2.0; Platelets 181; Potassium 4.0; Sodium 138; TSH 0.479  Recent Lipid Panel    Component Value Date/Time   CHOL 145  02/13/2019 1032   CHOL 135 07/10/2015 1030   TRIG 78 02/13/2019 1032   HDL 75 02/13/2019 1032   HDL 71 07/10/2015 1030   CHOLHDL 1.9 02/13/2019 1032   LDLCALC 54 02/13/2019 1032    Physical Exam:    VS:  BP 130/90 (BP Location: Right Arm, Patient Position: Sitting, Cuff Size: Normal)   Pulse 62   Temp (!) 97.3 F (36.3 C)   Ht 5\' 3"  (1.6 m)   Wt 191 lb (86.6 kg)   SpO2 99%   BMI 33.83 kg/m     Wt Readings from Last 3 Encounters:  03/20/19 191 lb (86.6 kg)  March 15, 2019 187 lb 8 oz (85 kg)  02/13/19 189 lb (85.7  kg)     GEN:  Well nourished, well developed in no acute distress HEENT: Normal NECK: No JVD; No carotid bruits LYMPHATICS: No lymphadenopathy CARDIAC: RRR, no murmurs, rubs, gallops RESPIRATORY:  Clear to auscultation without rales, wheezing or rhonchi  ABDOMEN: Soft, non-tender, non-distended MUSCULOSKELETAL:  No edema; No deformity  SKIN: Warm and dry NEUROLOGIC:  Alert and oriented x 3 PSYCHIATRIC:  Normal affect   ASSESSMENT:   Patient's chest pain appears to be atypical.  Risk factors include hypertension, obesity.  females are known to present with atypical symptoms.  Anxiety and stress are also possible etiologies.  If stress test is normal then we will consider other causes. 1. Palpitations   2. Precordial pain   3. Essential hypertension    PLAN:    In order of problems listed above:  1. Cardiac monitor x2 weeks. 2. Lexiscan myocardial perfusion imaging stress test. 3. BP improved.  Continue amlodipine 5 mg daily.  Patient encouraged to check blood pressures at home.  Follow-up in 2 months.   Medication Adjustments/Labs and Tests Ordered: Current medicines are reviewed at length with the patient today.  Concerns regarding medicines are outlined above.  Orders Placed This Encounter  Procedures  . NM Myocar Multi W/Spect W/Wall Motion / EF  . LONG TERM MONITOR (3-14 DAYS)  . EKG 12-Lead   No orders of the defined types were placed in this  encounter.   Patient Instructions  Medication Instructions:  Your physician recommends that you continue on your current medications as directed. Please refer to the Current Medication list given to you today.  *If you need a refill on your cardiac medications before your next appointment, please call your pharmacy*  Lab Work: None ordered If you have labs (blood work) drawn today and your tests are completely normal, you will receive your results only by: Marland Kitchen MyChart Message (if you have MyChart) OR . A paper copy in the mail If you have any lab test that is abnormal or we need to change your treatment, we will call you to review the results.  Testing/Procedures: Your physician has requested that you have a lexiscan myoview. For further information please visit [email or web address]. Please follow instruction sheet, as given.  Your physician has recommended that you wear an zio monitor. Zio monitors are medical devices that record the heart's electrical activity. Doctors most often [place] these monitors to diagnose arrhythmias. Arrhythmias are problems with the speed or rhythm of the heartbeat. The monitor is a small, portable device. You can wear one while you do your normal daily activities. This is usually used to diagnose what is causing palpitations/syncope (passing out).    Follow-Up: At [facility], you and your health needs are our priority.  As part of our continuing mission to provide you with exceptional heart care, we have created designated Provider Care Teams.  These Care Teams include your primary Cardiologist (physician) and Advanced Practice Providers (APPs -  Physician Assistants and Nurse Practitioners) who all work together to provide you with the care you need, when you need it.  Your next appointment:   2 month(s)  The format for your next appointment:   In Person  Provider:    You may see Dr. Mylo Red- Charlestine Night or one of the following Advanced Practice Providers on your  designated Care Team:    Murray Hodgkins, NP  Christell Faith, PA-C  Marrianne Mood, PA-C   Other Instructions [place]  Your caregiver has ordered a Stress Test with nuclear  imaging. The purpose of this test is to evaluate the blood supply to your heart muscle. This procedure is referred to as a "Non-Invasive Stress Test." This is because other than having an IV started in your vein, nothing is inserted or "invades" your body. Cardiac stress tests are done to find areas of poor blood flow to the heart by determining the extent of coronary artery disease (CAD). Some patients exercise on a treadmill, which naturally increases the blood flow to your heart, while others who are  unable to walk on a treadmill due to physical limitations have a pharmacologic/chemical stress agent called Lexiscan . This medicine will mimic walking on a treadmill by temporarily increasing your coronary blood flow.   Please note: these test may take anywhere between 2-4 hours to complete  PLEASE REPORT TO [place] AT THE FIRST DESK WILL DIRECT YOU WHERE TO GO  Date of Procedure:_____________________________________  Arrival Time for Procedure:______________________________  Instructions regarding medication:   OK to take your morning medications with a sip of water.  PLEASE NOTIFY THE OFFICE AT LEAST 84 HOURS IN ADVANCE IF YOU ARE UNABLE TO KEEP YOUR APPOINTMENT.  [phone] AND  PLEASE NOTIFY NUCLEAR MEDICINE AT [facility] AT LEAST 24 HOURS IN ADVANCE IF YOU ARE UNABLE TO KEEP YOUR APPOINTMENT. [phone]  How to prepare for your Myoview test:  1. Do not eat or drink after midnight 2. No caffeine for 24 hours prior to test 3. No smoking 24 hours prior to test. 4. Your medication may be taken with water.  If your doctor stopped a medication because of this test, do not take that medication. 5. Ladies, please do not wear dresses.  Skirts or pants are appropriate. Please  wear a short sleeve shirt. 6. No perfume, cologne or lotion. 7. Wear comfortable walking shoes. No heels!               Signed, Kate Sable, MD  03/20/2019 4:26 PM     [facility]

## 2019-03-20 NOTE — Patient Instructions (Addendum)
Medication Instructions:  Your physician recommends that you continue on your current medications as directed. Please refer to the Current Medication list given to you today.  *If you need a refill on your cardiac medications before your next appointment, please call your pharmacy*  Lab Work: None ordered If you have labs (blood work) drawn today and your tests are completely normal, you will receive your results only by: Marland Kitchen MyChart Message (if you have MyChart) OR . A paper copy in the mail If you have any lab test that is abnormal or we need to change your treatment, we will call you to review the results.  Testing/Procedures: Your physician has requested that you have a lexiscan myoview. For further information please visit [email or web address]. Please follow instruction sheet, as given.  Your physician has recommended that you wear an zio monitor. Zio monitors are medical devices that record the heart's electrical activity. Doctors most often [place] these monitors to diagnose arrhythmias. Arrhythmias are problems with the speed or rhythm of the heartbeat. The monitor is a small, portable device. You can wear one while you do your normal daily activities. This is usually used to diagnose what is causing palpitations/syncope (passing out).    Follow-Up: At [facility], you and your health needs are our priority.  As part of our continuing mission to provide you with exceptional heart care, we have created designated Provider Care Teams.  These Care Teams include your primary Cardiologist (physician) and Advanced Practice Providers (APPs -  Physician Assistants and Nurse Practitioners) who all work together to provide you with the care you need, when you need it.  Your next appointment:   2 month(s)  The format for your next appointment:   In Person  Provider:    You may see Dr. Mylo Red- Charlestine Night or one of the following Advanced Practice Providers on your designated Care Team:    Murray Hodgkins, NP  Christell Faith, PA-C  Marrianne Mood, PA-C   Other Instructions [place]  Your caregiver has ordered a Stress Test with nuclear imaging. The purpose of this test is to evaluate the blood supply to your heart muscle. This procedure is referred to as a "Non-Invasive Stress Test." This is because other than having an IV started in your vein, nothing is inserted or "invades" your body. Cardiac stress tests are done to find areas of poor blood flow to the heart by determining the extent of coronary artery disease (CAD). Some patients exercise on a treadmill, which naturally increases the blood flow to your heart, while others who are  unable to walk on a treadmill due to physical limitations have a pharmacologic/chemical stress agent called Lexiscan . This medicine will mimic walking on a treadmill by temporarily increasing your coronary blood flow.   Please note: these test may take anywhere between 2-4 hours to complete  PLEASE REPORT TO [place] AT THE FIRST DESK WILL DIRECT YOU WHERE TO GO  Date of Procedure:_____________________________________  Arrival Time for Procedure:______________________________  Instructions regarding medication:   OK to take your morning medications with a sip of water.  PLEASE NOTIFY THE OFFICE AT LEAST 77 HOURS IN ADVANCE IF YOU ARE UNABLE TO KEEP YOUR APPOINTMENT.  [phone] AND  PLEASE NOTIFY NUCLEAR MEDICINE AT [facility] AT LEAST 24 HOURS IN ADVANCE IF YOU ARE UNABLE TO KEEP YOUR APPOINTMENT. [phone]  How to prepare for your Myoview test:  1. Do not eat or drink after midnight 2. No caffeine for  24 hours prior to test 3. No smoking 24 hours prior to test. 4. Your medication may be taken with water.  If your doctor stopped a medication because of this test, do not take that medication. 5. Ladies, please do not wear dresses.  Skirts or pants are appropriate. Please wear a short sleeve shirt. 6. No  perfume, cologne or lotion. 7. Wear comfortable walking shoes. No heels!

## 2019-03-27 ENCOUNTER — Ambulatory Visit: Payer: Managed Care, Other (non HMO) | Admitting: Cardiology

## 2019-03-28 ENCOUNTER — Telehealth: Payer: Self-pay | Admitting: Family Medicine

## 2019-03-28 DIAGNOSIS — I10 Essential (primary) hypertension: Secondary | ICD-10-CM

## 2019-03-28 NOTE — Telephone Encounter (Signed)
I contact the patient and she decided to try the Amlodipine 7.5mg . She will continue to monitor her blood pressure and f/u in about a week with her reading.

## 2019-03-28 NOTE — Telephone Encounter (Signed)
Could you call patient to follow-up with her? See if she has any symptoms or other concerns.  She was previously seen by Cardiology after she was in hospital for palpitations, and they are doing further testing.  Her BP med was increased since our last visit Amlodipine 2.5 now up to 5mg  daily.  If she has persistent BP >140/100 then she would need either high dose Amlodipine or possible other med added on.  She could try Amlodipine 5mg  tablets (ONE AND HALF) for dose of 7.5mg  to start or eventually up to TWO pills = 10mg  daily.  Otherwise if we need new med would have to likely do virtual visit to discuss med options with her further.  [name], DO [facility] 03/28/2019, 1:15 PM

## 2019-03-28 NOTE — Telephone Encounter (Signed)
Pt said that her BP was 146/113 last  Not was not sure what she need to do pt call back [phone]

## 2019-03-30 ENCOUNTER — Telehealth: Payer: Self-pay

## 2019-03-30 DIAGNOSIS — R072 Precordial pain: Secondary | ICD-10-CM

## 2019-03-30 NOTE — Telephone Encounter (Addendum)
Order for the Coronary CTA placed and is in [place]. Message  fwd back to [facility] with prior auth, and to [employer] to contact the patient to schedule.   Attempted to contact the patient to make her aware of the change in the plan of care due to her insurance denial of the [place]. LMTCB.

## 2019-03-30 NOTE — Telephone Encounter (Signed)
-----   Message from Kate Sable, MD sent at 03/29/2019  4:04 PM EST ----- Regarding: RE: NM Denial Lattie Haw or Anderson Malta, can you please order coronary CTA for this patient?  Thank you ----- Message ----- From: Ciro Backer Sent: 03/28/2019  10:32 AM EST To: Kate Sable, MD Subject: RE: NM Denial                                  Hello,  I have an auth for the Coronary CTA. Once I see the order, I will attach the auth and send to scheduling.   Thanks so much.   Estill Bamberg  ----- Message ----- From: Kate Sable, MD Sent: 03/27/2019   3:24 PM EST To: Ciro Backer Subject: RE: NM Denial                                  Proceed with Coronary CTA please. thanks ----- Message ----- From: Ciro Backer Sent: 03/27/2019  10:24 AM EST To: Kate Sable, MD Subject: NM Denial                                      Good morning,   Christella Scheuermann has denied this patient's Lexi for 04/05/2019. They are deeming the test not medically necessary Would you like to speak with the Physician Reviewer or would you like for me to precert a Cardiac CT?    Here's the information for the review, in case you would like to speak with them directly for a peer to peer.  Marguritte Florentino ID: [id number] Ref # [id number] Phone: [phone] opt. 4 / opt. 1    Thank you for your time,  Ciro Backer

## 2019-04-03 NOTE — Telephone Encounter (Signed)
No answer with patient. Left message to call back.

## 2019-04-03 NOTE — Telephone Encounter (Signed)
Patient returning phone call regarding procedure and cost. Please return call when able

## 2019-04-03 NOTE — Telephone Encounter (Signed)
Message precert, Crystal Levine, and received the [place] 60454 so I can give to patient for her to contact her insurance company. Out of pocket CT w/FFp is $5000 and w/o FFP is $1700. Should be less with her insurance.  Attempted to reach patient and no answer. Left message to call back.

## 2019-04-07 ENCOUNTER — Other Ambulatory Visit: Payer: Self-pay

## 2019-04-07 MED ORDER — BUSPIRONE HCL 7.5 MG PO TABS: 7.5000 mg | ORAL_TABLET | Freq: Three times a day (TID) | ORAL | 0 refills | Status: DC

## 2019-04-11 DIAGNOSIS — I10 Essential (primary) hypertension: Secondary | ICD-10-CM

## 2019-04-11 MED ORDER — AMLODIPINE BESYLATE 5 MG PO TABS: 7.5000 mg | ORAL_TABLET | Freq: Every day | ORAL | 1 refills | Status: DC

## 2019-04-18 NOTE — Telephone Encounter (Signed)
No answer. Left message to call back.   

## 2019-04-25 NOTE — Telephone Encounter (Signed)
Msg received from [facility].  Patient scheduled for cardiac ct on Feb 25,2021 9am.  Letter mailed to the patient with pre-test instructions.

## 2019-04-29 ENCOUNTER — Other Ambulatory Visit: Payer: Self-pay | Admitting: Family Medicine

## 2019-04-29 DIAGNOSIS — G43109 Migraine with aura, not intractable, without status migrainosus: Secondary | ICD-10-CM

## 2019-05-02 DIAGNOSIS — F329 Major depressive disorder, single episode, unspecified: Secondary | ICD-10-CM

## 2019-05-02 DIAGNOSIS — F32A Depression, unspecified: Secondary | ICD-10-CM

## 2019-05-03 MED ORDER — BUSPIRONE HCL 7.5 MG PO TABS: 7.5000 mg | ORAL_TABLET | Freq: Three times a day (TID) | ORAL | 2 refills | Status: DC

## 2019-05-04 ENCOUNTER — Telehealth: Payer: Self-pay | Admitting: *Deleted

## 2019-05-04 NOTE — Telephone Encounter (Signed)
-----   Message from Kate Sable, MD sent at 05/01/2019  8:46 AM EST ----- Patient triggered events were associated with ventricular ectopies.  No significant arrhythmias noted.

## 2019-05-04 NOTE — Telephone Encounter (Signed)
Results were released to [place] on 05/01/19. Patient has not viewed at this time.  Attempted to reach patient. No answer. LM without identifiable information to view MyChart or call us back.

## 2019-05-05 ENCOUNTER — Other Ambulatory Visit: Payer: Self-pay | Admitting: Family Medicine

## 2019-05-05 DIAGNOSIS — R42 Dizziness and giddiness: Secondary | ICD-10-CM

## 2019-05-05 DIAGNOSIS — F329 Major depressive disorder, single episode, unspecified: Secondary | ICD-10-CM

## 2019-05-05 DIAGNOSIS — F32A Depression, unspecified: Secondary | ICD-10-CM

## 2019-05-05 DIAGNOSIS — F5105 Insomnia due to other mental disorder: Secondary | ICD-10-CM

## 2019-05-05 NOTE — Telephone Encounter (Signed)
Patient has viewed the results in [place].

## 2019-05-17 ENCOUNTER — Ambulatory Visit
Admission: RE | Admit: 2019-05-17 | Discharge: 2019-05-17 | Disposition: A | Discharge status: Home / Self Care | Payer: Managed Care, Other (non HMO) | Source: Ambulatory Visit | Attending: Family Medicine | Admitting: Family Medicine

## 2019-05-17 DIAGNOSIS — Z1231 Encounter for screening mammogram for malignant neoplasm of breast: Secondary | ICD-10-CM

## 2019-05-22 ENCOUNTER — Ambulatory Visit: Payer: Managed Care, Other (non HMO) | Admitting: Cardiology

## 2019-05-26 DIAGNOSIS — G43109 Migraine with aura, not intractable, without status migrainosus: Secondary | ICD-10-CM

## 2019-05-26 MED ORDER — SUMATRIPTAN SUCCINATE 50 MG PO TABS: ORAL_TABLET | ORAL | 2 refills | Status: DC

## 2019-06-14 ENCOUNTER — Telehealth (HOSPITAL_COMMUNITY): Payer: Self-pay | Admitting: Emergency Medicine

## 2019-06-14 ENCOUNTER — Encounter (HOSPITAL_COMMUNITY): Payer: Self-pay

## 2019-06-14 NOTE — Telephone Encounter (Signed)
Left message on voicemail with name and callback number Nazaret Chea RN Navigator Cardiac Imaging [facility] [phone] Office [phone] Cell  

## 2019-06-15 ENCOUNTER — Ambulatory Visit: Admission: RE | Admit: 2019-06-15 | Payer: Managed Care, Other (non HMO) | Source: Ambulatory Visit

## 2019-06-19 ENCOUNTER — Ambulatory Visit: Payer: Managed Care, Other (non HMO) | Admitting: Cardiology

## 2019-06-20 ENCOUNTER — Encounter: Payer: Self-pay | Admitting: Cardiology

## 2019-07-17 ENCOUNTER — Telehealth: Payer: Self-pay

## 2019-07-17 NOTE — Telephone Encounter (Signed)
From: Howie Ill Sent: 07/17/2019   7:57 AM EDT To: Lamar Laundry, RN Subject: cancel order                                   Good morning we have tried reaching this patient several times with no response. We are cancelling the cardiac ct order at this time.    Thank you  Jannet Askew

## 2019-08-05 ENCOUNTER — Other Ambulatory Visit: Payer: Self-pay | Admitting: Family Medicine

## 2019-08-05 DIAGNOSIS — I10 Essential (primary) hypertension: Secondary | ICD-10-CM

## 2019-08-05 DIAGNOSIS — F419 Anxiety disorder, unspecified: Secondary | ICD-10-CM

## 2019-08-05 DIAGNOSIS — F32A Depression, unspecified: Secondary | ICD-10-CM

## 2019-08-05 DIAGNOSIS — F329 Major depressive disorder, single episode, unspecified: Secondary | ICD-10-CM

## 2019-08-07 ENCOUNTER — Other Ambulatory Visit: Payer: Self-pay

## 2019-08-08 ENCOUNTER — Ambulatory Visit: Payer: Managed Care, Other (non HMO) | Admitting: Gastroenterology

## 2019-10-07 ENCOUNTER — Other Ambulatory Visit: Payer: Self-pay | Admitting: Family Medicine

## 2019-10-07 DIAGNOSIS — G43109 Migraine with aura, not intractable, without status migrainosus: Secondary | ICD-10-CM

## 2019-10-07 NOTE — Telephone Encounter (Signed)
Requested Prescriptions  Pending Prescriptions Disp Refills   SUMAtriptan (IMITREX) 50 MG tablet [Pharmacy Med Name: SUMATRIPTAN 50MG  TABLETS] 12 tablet 3    Sig: TAKE 1 TO 2 TABLETS BY MOUTH 1 TIME AS NEEDED FOR MIGRAINE. MAY REPEAT IN 2 HOURS IF HEADACHE PERSISTS     Neurology:  Migraine Therapy - Triptan Failed - 10/07/2019  8:08 AM      Failed - Last BP in normal range    BP Readings from Last 1 Encounters:  03/20/19 130/90         Passed - Valid encounter within last 12 months    Recent Outpatient Visits          7 months ago Annual physical exam   [place], DO   10 months ago Migraine with aura and without status migrainosus, not intractable   [place], DO   1 year ago Other iron deficiency anemia   [place], Devonne Doughty, DO   2 years ago Annual physical exam   [facility] Olin Hauser, DO   2 years ago Anxiety and depression   [place], Devonne Doughty, DO

## 2019-10-21 ENCOUNTER — Other Ambulatory Visit: Payer: Self-pay | Admitting: Family Medicine

## 2019-10-21 DIAGNOSIS — F32A Depression, unspecified: Secondary | ICD-10-CM

## 2019-10-21 NOTE — Telephone Encounter (Signed)
Requested medication (s) are due for refill today: yes  Requested medication (s) are on the active medication list: yes  Last refill:  08/05/19  Future visit scheduled: no  Notes to clinic:  Called pt but unable to LM- VM full. Pt already given 90 day courtesy RF   Requested Prescriptions  Pending Prescriptions Disp Refills   busPIRone (BUSPAR) 7.5 MG tablet [Pharmacy Med Name: BUSPIRONE 7.5MG  TABLETS] 90 tablet 0    Sig: TAKE 1 TABLET(7.5 MG) BY MOUTH THREE TIMES DAILY      Psychiatry: Anxiolytics/Hypnotics - Non-controlled Failed - 10/21/2019 11:11 AM      Failed - Valid encounter within last 6 months    Recent Outpatient Visits           8 months ago Annual physical exam   [place], DO   11 months ago Migraine with aura and without status migrainosus, not intractable   [place], DO   1 year ago Other iron deficiency anemia   [place], Devonne Doughty, DO   2 years ago Annual physical exam   [facility] Olin Hauser, DO   2 years ago Anxiety and depression   [place], Devonne Doughty, DO

## 2019-11-23 ENCOUNTER — Other Ambulatory Visit: Payer: Self-pay | Admitting: Family Medicine

## 2019-11-23 DIAGNOSIS — I10 Essential (primary) hypertension: Secondary | ICD-10-CM

## 2019-11-23 NOTE — Telephone Encounter (Signed)
Courtesy refill. Message sent to make appointment.

## 2019-12-05 ENCOUNTER — Other Ambulatory Visit: Payer: Self-pay | Admitting: Family Medicine

## 2019-12-05 DIAGNOSIS — F32A Depression, unspecified: Secondary | ICD-10-CM

## 2019-12-05 DIAGNOSIS — F419 Anxiety disorder, unspecified: Secondary | ICD-10-CM

## 2019-12-05 DIAGNOSIS — F329 Major depressive disorder, single episode, unspecified: Secondary | ICD-10-CM

## 2019-12-07 ENCOUNTER — Other Ambulatory Visit: Payer: Self-pay | Admitting: Internal Medicine

## 2019-12-21 ENCOUNTER — Other Ambulatory Visit: Payer: Self-pay | Admitting: Family Medicine

## 2019-12-21 DIAGNOSIS — F419 Anxiety disorder, unspecified: Secondary | ICD-10-CM

## 2019-12-21 DIAGNOSIS — F32A Depression, unspecified: Secondary | ICD-10-CM

## 2019-12-21 MED ORDER — BUSPIRONE HCL 7.5 MG PO TABS: 7.5000 mg | ORAL_TABLET | Freq: Three times a day (TID) | ORAL | 2 refills | Status: DC

## 2019-12-21 NOTE — Telephone Encounter (Signed)
Medication: busPIRone (BUSPAR) 7.5 MG tablet [[id number]]   Has the patient contacted their pharmacy? YES (Agent: If no, request that the patient contact the pharmacy for the refill.) (Agent: If yes, when and what did the pharmacy advise?)  Preferred Pharmacy (with phone number or street name): [facility] [place], [place] - [address]  Phone:  [phone] Fax:  [phone]     Agent: Please be advised that RX refills may take up to 3 business days. We ask that you follow-up with your pharmacy.

## 2019-12-21 NOTE — Telephone Encounter (Signed)
Requested medication (s) are due for refill today: yes  Requested medication (s) are on the active medication list: yes   Last refill: 10/21/2019  Future visit scheduled: yes   Notes to clinic:  patient has appointment on 02/14/2020 and needs enough until then   Requested Prescriptions  Pending Prescriptions Disp Refills   busPIRone (BUSPAR) 7.5 MG tablet 90 tablet 0      Psychiatry: Anxiolytics/Hypnotics - Non-controlled Failed - 12/21/2019  1:19 PM      Failed - Valid encounter within last 6 months    Recent Outpatient Visits           10 months ago Annual physical exam   [place], DO   1 year ago Migraine with aura and without status migrainosus, not intractable   [place], DO   1 year ago Other iron deficiency anemia   [place], Devonne Doughty, DO   2 years ago Annual physical exam   [facility] Olin Hauser, DO   2 years ago Anxiety and depression   [place], DO       Future Appointments             In 1 month [place], Devonne Doughty, [facility], [facility]

## 2020-01-03 ENCOUNTER — Emergency Department: Payer: Managed Care, Other (non HMO)

## 2020-01-03 ENCOUNTER — Emergency Department
Admission: EM | Admit: 2020-01-03 | Discharge: 2020-01-03 | Disposition: A | Discharge status: Home / Self Care | Payer: Managed Care, Other (non HMO) | Attending: Emergency Medicine | Admitting: Emergency Medicine

## 2020-01-03 ENCOUNTER — Other Ambulatory Visit: Payer: Self-pay

## 2020-01-03 DIAGNOSIS — I10 Essential (primary) hypertension: Secondary | ICD-10-CM | POA: Diagnosis not present

## 2020-01-03 DIAGNOSIS — U071 COVID-19: Secondary | ICD-10-CM

## 2020-01-03 DIAGNOSIS — R0602 Shortness of breath: Secondary | ICD-10-CM

## 2020-01-03 DIAGNOSIS — Z79899 Other long term (current) drug therapy: Secondary | ICD-10-CM | POA: Diagnosis not present

## 2020-01-03 LAB — CBC WITH DIFFERENTIAL/PLATELET
Abs Immature Granulocytes: 0.05 10*3/uL (ref 0.00–0.07)
Basophils Absolute: 0 10*3/uL (ref 0.0–0.1)
Basophils Relative: 1 %
Eosinophils Absolute: 0 10*3/uL (ref 0.0–0.5)
Eosinophils Relative: 0 %
HCT: 45.7 % (ref 36.0–46.0)
Hemoglobin: 16.2 g/dL — ABNORMAL HIGH (ref 12.0–15.0)
Immature Granulocytes: 1 %
Lymphocytes Relative: 27 %
Lymphs Abs: 1 10*3/uL (ref 0.7–4.0)
MCH: 30.6 pg (ref 26.0–34.0)
MCHC: 35.4 g/dL (ref 30.0–36.0)
MCV: 86.4 fL (ref 80.0–100.0)
Monocytes Absolute: 0.2 10*3/uL (ref 0.1–1.0)
Monocytes Relative: 6 %
Neutro Abs: 2.4 10*3/uL (ref 1.7–7.7)
Neutrophils Relative %: 65 %
Platelets: 129 10*3/uL — ABNORMAL LOW (ref 150–400)
RBC: 5.29 MIL/uL — ABNORMAL HIGH (ref 3.87–5.11)
RDW: 12.6 % (ref 11.5–15.5)
WBC: 3.8 10*3/uL — ABNORMAL LOW (ref 4.0–10.5)
nRBC: 0 % (ref 0.0–0.2)

## 2020-01-03 LAB — BASIC METABOLIC PANEL
Anion gap: 12 (ref 5–15)
BUN: 9 mg/dL (ref 6–20)
CO2: 20 mmol/L — ABNORMAL LOW (ref 22–32)
Calcium: 8.5 mg/dL — ABNORMAL LOW (ref 8.9–10.3)
Chloride: 106 mmol/L (ref 98–111)
Creatinine, Ser: 0.83 mg/dL (ref 0.44–1.00)
GFR calc Af Amer: >60 mL/min (ref >60)
GFR calc non Af Amer: >60 mL/min (ref >60)
Glucose, Bld: 93 mg/dL (ref 70–99)
Potassium: 3.3 mmol/L — ABNORMAL LOW (ref 3.5–5.1)
Sodium: 138 mmol/L (ref 135–145)

## 2020-01-03 MED ORDER — KETOROLAC TROMETHAMINE 60 MG/2ML IM SOLN
30.0000 mg | Freq: Once | INTRAMUSCULAR | Status: AC
  Administered 2020-01-03: 30 mg via INTRAMUSCULAR
  Filled 2020-01-03: qty 2

## 2020-01-03 MED ORDER — ACETAMINOPHEN 500 MG PO TABS
1000.0000 mg | ORAL_TABLET | Freq: Once | ORAL | Status: AC
  Administered 2020-01-03: 1000 mg via ORAL
  Filled 2020-01-03: qty 2

## 2020-01-03 MED ORDER — POTASSIUM CHLORIDE CRYS ER 20 MEQ PO TBCR
40.0000 meq | EXTENDED_RELEASE_TABLET | Freq: Once | ORAL | Status: AC
  Administered 2020-01-03: 40 meq via ORAL
  Filled 2020-01-03: qty 2

## 2020-01-03 NOTE — Discharge Instructions (Addendum)
Please take Tylenol and ibuprofen/Advil for your pain.  It is safe to take them together, or to alternate them every few hours.  Take up to 1000mg  of Tylenol at a time, up to 4 times per day.  Do not take more than 4000 mg of Tylenol in 24 hours.  For ibuprofen, take 400-600 mg, 4-5 times per day.  Use the above regimen to control your fevers and discomfort so you can feel well enough to stay hydrated and get some rest.  Please stay home, quarantined and masks until you feel better and had a negative test result. After all of this, I would strongly recommend that you get vaccinated for COVID-19.  Return to the ED with any worsening symptoms.

## 2020-01-03 NOTE — ED Notes (Signed)
Pt oxygen saturation maintaining 96% on room air while ambulating. MD Tamala Julian aware.

## 2020-01-03 NOTE — ED Notes (Signed)
E-signature not working at this time. Pt verbalized understanding of D/C instructions, prescriptions and follow up care with no further questions at this time. Pt in NAD and ambulatory at time of D/C.  

## 2020-01-03 NOTE — ED Triage Notes (Signed)
Pt states she was tested positive at [facility] for covid and has been having cough with fever SOB that has worsened over a week. Pt is in NAD at present, able to speak in complete sentences.

## 2020-01-03 NOTE — ED Provider Notes (Signed)
[facility] Emergency Department Provider Note ____________________________________________   First MD Initiated Contact with Patient 01/03/20 1331     (approximate)  I have reviewed the triage vital signs and the nursing notes.  HISTORY  Chief Complaint Shortness of Breath   HPI Crystal Levine is a 54 y.o. femalewho presents to the ED for evaluation of shortness of breath.  Chart review indicates history of HTN, depression and anxiety.  Patient ports a day of symptoms and diagnosed with COVID-19 7 days ago.  She reports symptoms of subjective fevers and chills, poor p.o. intake, lethargy, nonproductive cough and shortness of breath.  She denies any chest pain, syncope, headache, abdominal pain, dysuria, diarrhea or trauma.  She reports worsening shortness of breath and nonproductive cough this morning while waking up, and her husband urged her to get evaluated in the ED.  Patient denies any pain at this time and reports feeling "crummy."   Past Medical History:  Diagnosis Date  . Anemia   . Anxiety   . Depression   . Hypertension   . Migraine    In past    Patient Active Problem List   Diagnosis Date Noted  . Hypertensive urgency 03/10/2019  . Chest pain 03/09/2019  . Constipation   . Cervical high risk human papillomavirus (HPV) DNA test positive 05/12/2017  . Obesity (BMI 30.0-34.9) 08/10/2016  . Low TSH level 07/13/2016  . Essential hypertension 07/13/2016  . Migraine with aura and without status migrainosus, not intractable 07/13/2016  . 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 Copper deficiency 02/21/2016  . Vitamin B12 deficiency due to intestinal malabsorption 01/29/2016  . History of endometrial ablation 12/31/2015  . B12 deficiency anemia 11/06/2015  . Insomnia 09/04/2015  . B12 deficiency 09/04/2015  . Subclinical hyperthyroidism 09/04/2015  . Vitamin D deficiency 09/04/2015  . Iron deficiency anemia 07/09/2015  . Anxiety and  depression 07/09/2015  . S/P gastric bypass 07/09/2015    Past Surgical History:  Procedure Laterality Date  . CHOLECYSTECTOMY    . COLONOSCOPY WITH PROPOFOL N/A 05/27/2017   Procedure: COLONOSCOPY WITH PROPOFOL;  Surgeon: Lin Landsman, MD;  Location: [facility] ENDOSCOPY;  Service: Gastroenterology;  Laterality: N/A;  . DILATION AND CURETTAGE OF UTERUS  2003   2 Miscarriages  . DILITATION & CURRETTAGE/HYSTROSCOPY WITH NOVASURE ABLATION N/A 12/16/2015   Procedure: DILATATION & CURETTAGE/HYSTEROSCOPY WITH NOVASURE ABLATION;  Surgeon: Brayton Mars, MD;  Location: [facility] ORS;  Service: Gynecology;  Laterality: N/A;  . GALLBLADDER SURGERY  2003  . GASTRIC BYPASS  2004  . GASTRIC BYPASS    . HERNIA REPAIR  2005  . HERNIA REPAIR      Prior to Admission medications   Medication Sig Start Date End Date Taking? Authorizing Provider  amLODipine (NORVASC) 2.5 MG tablet Take 2.5 mg by mouth daily. 07/04/19   [provider]  amLODipine (NORVASC) 5 MG tablet TAKE 1 AND 1/2 TABLETS(7.5 MG) BY MOUTH DAILY 11/23/19   [place]al[place]os, Devonne Doughty, DO  busPIRone (BUSPAR) 7.5 MG tablet Take 1 tablet (7.5 mg total) by mouth 3 (three) times daily. 12/21/19   Karamalegos, Devonne Doughty, DO  cyanocobalamin (,VITAMIN B-12,) 1000 MCG/ML injection ADMINISTER 1 ML(1000 MCG) IN THE MUSCLE EVERY 30 DAYS 12/08/19   Cammie Sickle, MD  SUMAtriptan (IMITREX) 50 MG tablet TAKE 1 TO 2 TABLETS BY MOUTH 1 TIME AS NEEDED FOR MIGRAINE. MAY REPEAT IN 2 HOURS IF HEADACHE PERSISTS 10/07/19   [place] Devonne Doughty, DO    Allergies Patient  has no known allergies.  Family History  Adopted: Yes  Family history unknown: Yes    Social History Social History   Tobacco Use  . Smoking status: Never Smoker  . Smokeless tobacco: Never Used  Vaping Use  . Vaping Use: Never used  Substance Use Topics  . Alcohol use: No  . Drug use: No    Review of Systems  Constitutional: Positive subjective fevers and  chills. Eyes: No visual changes. ENT: No sore throat. Cardiovascular: Denies chest pain. Respiratory: Positive for shortness of breath nonproductive cough. Gastrointestinal: No abdominal pain.  No nausea, no vomiting.  No diarrhea.  No constipation.  Positive for poor p.o. intake. Genitourinary: Negative for dysuria. Musculoskeletal: Negative for back pain. Skin: Negative for rash. Neurological: Negative for headaches, focal weakness or numbness.   ____________________________________________   PHYSICAL EXAM:  VITAL SIGNS: Vitals:   01/03/20 0901 01/03/20 1238  BP: 122/87 118/77  Pulse: (!) 110 98  Resp: 20 18  Temp: 98.7 F (37.1 C) 97.9 F (36.6 C)  SpO2: 96% 96%      Constitutional: Alert and oriented. Well appearing and in no acute distress. Eyes: Conjunctivae are normal. PERRL. EOMI. Head: Atraumatic. Nose: No congestion/rhinnorhea. Mouth/Throat: Mucous membranes are moist.  Oropharynx non-erythematous. Neck: No stridor. No cervical spine tenderness to palpation. Cardiovascular: Normal rate, regular rhythm. Grossly normal heart sounds.  Good peripheral circulation. Respiratory: Normal respiratory effort.  No retractions. Lungs CTAB. Gastrointestinal: Soft , nondistended, nontender to palpation. No abdominal bruits. No CVA tenderness. Musculoskeletal: No lower extremity tenderness nor edema.  No joint effusions. No signs of acute trauma. Neurologic:  Normal speech and language. No gross focal neurologic deficits are appreciated. No gait instability noted. Skin:  Skin is warm, dry and intact. No rash noted. Psychiatric: Mood and affect are normal. Speech and behavior are normal.  ____________________________________________   LABS (all labs ordered are listed, but only abnormal results are displayed)  Labs Reviewed  BASIC METABOLIC PANEL - Abnormal; Notable for the following components:      Result Value   Potassium 3.3 (*)    CO2 20 (*)    Calcium 8.5 (*)     All other components within normal limits  CBC WITH DIFFERENTIAL/PLATELET - Abnormal; Notable for the following components:   WBC 3.8 (*)    RBC 5.29 (*)    Hemoglobin 16.2 (*)    Platelets 129 (*)    All other components within normal limits   ____________________________________________  12 Lead EKG  sinus rhythm, 106 bpm, normal axis and intervals.  No evidence of acute ischemia. ____________________________________________  RADIOLOGY  ED MD interpretation: 2 view CXR with multifocal infiltrates consistent with known COVID-19 without discrete lobar infiltration to suggest a bacterial pneumonia.  Official radiology report(s): DG Chest 2 View  Result Date: 01/03/2020 CLINICAL DATA:  Cough, shortness of breath. EXAM: CHEST - 2 VIEW COMPARISON:  March 09, 2019. FINDINGS: The heart size and mediastinal contours are within normal limits. No pneumothorax or pleural effusion is noted. Patchy airspace opacities are noted throughout both lungs consistent with multifocal pneumonia. The visualized skeletal structures are unremarkable. IMPRESSION: Bilateral multifocal pneumonia. Electronically Signed   By: Marijo Conception M.D.   On: 01/03/2020 09:31    ____________________________________________   PROCEDURES and INTERVENTIONS  Procedure(s) performed (including Critical Care):  Procedures  Medications  potassium chloride SA (KLOR-CON) CR tablet 40 mEq (40 mEq Oral Given 01/03/20 1351)  ketorolac (TORADOL) injection 30 mg (30 mg Intramuscular Given 01/03/20 1352)  acetaminophen (TYLENOL) tablet 1,000 mg (1,000 mg Oral Given 01/03/20 1351)    ____________________________________________   MDM / ED COURSE  Relatively healthy 54 year old woman presents with continued symptoms of COVID-19 amenable to outpatient management.  Patient tachycardic in triage but self resolved, vitals otherwise normal on room air with ambulation.  Exam without evidence of acute derangements.  Patient is  well-appearing without distress, neurovascular deficits or signs of trauma.  She is ambulatory at her baseline without hypoxia or distress.  Blood work with mild hypokalemia, thus repleted orally.  Further provided IM Toradol and Tylenol with improving symptoms.  CXR with expected infiltrates in the setting of known COVID-19 without discrete lobar filtration to suggest a superimposed bacterial pneumonia.  She further does not have productive cough or leukocytosis.  We discussed outpatient management of COVID-19 and return precautions for the ED.  Patient medically stable for discharge home.    ____________________________________________   FINAL CLINICAL IMPRESSION(S) / ED DIAGNOSES  Final diagnoses:  COVID-19  SOB (shortness of breath)     ED Discharge Orders    None       Gyasi Hazzard   Note:  This document was prepared using Dragon voice recognition software and may include unintentional dictation errors.   Vladimir Crofts, MD 01/03/20 [phone]

## 2020-01-05 ENCOUNTER — Encounter: Payer: Self-pay | Admitting: Family Medicine

## 2020-01-05 ENCOUNTER — Telehealth (INDEPENDENT_AMBULATORY_CARE_PROVIDER_SITE_OTHER): Payer: Managed Care, Other (non HMO) | Admitting: Family Medicine

## 2020-01-05 ENCOUNTER — Other Ambulatory Visit: Payer: Self-pay

## 2020-01-05 DIAGNOSIS — J1282 Pneumonia due to coronavirus disease 2019: Secondary | ICD-10-CM

## 2020-01-05 DIAGNOSIS — U071 COVID-19: Secondary | ICD-10-CM | POA: Diagnosis not present

## 2020-01-05 MED ORDER — PREDNISONE 20 MG PO TABS: ORAL_TABLET | ORAL | 0 refills | Status: DC

## 2020-01-05 MED ORDER — BENZONATATE 100 MG PO CAPS: 100.0000 mg | ORAL_CAPSULE | Freq: Three times a day (TID) | ORAL | 0 refills | Status: DC | PRN

## 2020-01-05 MED ORDER — AZITHROMYCIN 250 MG PO TABS: ORAL_TABLET | ORAL | 0 refills | Status: DC

## 2020-01-05 MED ORDER — ALBUTEROL SULFATE HFA 108 (90 BASE) MCG/ACT IN AERS: 2.0000 | INHALATION_SPRAY | RESPIRATORY_TRACT | 0 refills | Status: DC | PRN

## 2020-01-05 NOTE — Patient Instructions (Signed)
You may have coronavirus / [place]  Start treatment with Prednisone, azithromycin.  - Use Albuterol inhaler 2 puffs every 4-6 hours around the clock for next 2-3 days, max up to 5 days then use as needed (ask pharmacist to demonstrate proper inhaler use)  Start Tessalon Perls take 1 capsule up to 3 times a day as needed for cough  If symptoms do not resolve or significantly improve OR if WORSENING - fever / cough - or worsening shortness of breath - then should contact us and seek advice on next steps in treatment at home vs where/when to seek care at Urgent Care or Hospital ED for further intervention   Please schedule a Follow-up Appointment to: Return in about 4 days (around 01/09/2020) for virtual telephone covid/pneumonia [place].  If you have any other questions or concerns, please feel free to call the office or send a message through [place]. You may also schedule an earlier appointment if necessary.  Additionally, you may be receiving a survey about your experience at our office within a few days to 1 week by e-mail or mail. We value your feedback.  [name], DO [place]

## 2020-01-05 NOTE — Progress Notes (Signed)
Virtual Visit via Telephone The purpose of this virtual visit is to provide medical care while limiting exposure to the novel coronavirus (COVID19) for both patient and office staff.  Consent was obtained for phone visit:  Yes.   Answered questions that patient had about telehealth interaction:  Yes.   I discussed the limitations, risks, security and privacy concerns of performing an evaluation and management service by telephone. I also discussed with the patient that there may be a patient responsible charge related to this service. The patient expressed understanding and agreed to proceed.  Patient Location: Home Provider Location: [name])   ---------------------------------------------------------------------- Chief Complaint  Patient presents with  . Covid Positive    cough is worst, fatigue onset 10 days denies fever  . Pneumonia    diagnosed with pneumonia and hospital send her home with OTC suggestion meds --cough is really worst    S: Reviewed CMA documentation. I have called patient and gathered additional HPI as follows:  COVID19 Reports that symptoms started on 12/25/19 with cough and congestion and fatigue. She went to [facility] tested positive COVID19 on 12/27/19. She has remained at home in quarantine. Tried OTC cold cough medicines.  She is not vaccinated against [place]  Patient currently in isolation Denies any high risk travel to areas of current concern for COVID19. Denies any known or suspected exposure to person with or possibly with COVID19.  Admits low grade fevers chills, cough, shortness of breat, fatigue Denies any body ache, shortness of breath, sinus pain or pressure, headache, abdominal pain, diarrhea  -------------------------------------------------------------------------- O: No physical exam performed due to remote telephone encounter.  DG Chest 2 ViewPerformed 01/03/2020 Final result  Study Result CLINICAL DATA: Cough,  shortness of breath.  EXAM: CHEST - 2 VIEW  COMPARISON: March 09, 2019.  FINDINGS: The heart size and mediastinal contours are within normal limits. No pneumothorax or pleural effusion is noted. Patchy airspace opacities are noted throughout both lungs consistent with multifocal pneumonia. The visualized skeletal structures are unremarkable.  IMPRESSION: Bilateral multifocal pneumonia.   Electronically Signed By: Marijo Conception M.D. On: 01/03/2020 09:31   -------------------------------------------------------------------------- A&P:   [id number], with complication bilateral multifocal PNEUMONIA  Post ED follow-up from 01/03/20 on this same issue, treated there, without other high risk features or hypoxia or new concerns. At that time CXR determine to be patchy secondary to [place]  Now with some worsening, will provide empiric coverage of possible superimposed bacterial atypical pneumonia  - No comorbid pulmonary conditions (asthma, COPD) or immunocompromise   Start treatment with Prednisone, azithromycin.  - Use Albuterol inhaler 2 puffs every 4-6 hours around the clock for next 2-3 days, max up to 5 days then use as needed (ask pharmacist to demonstrate proper inhaler use)  Start Tessalon Perls take 1 capsule up to 3 times a day as needed for cough   Return to work anticipated 01/10/20 if cleared and fever free. Schedule virtual tues 9/21  No orders of the defined types were placed in this encounter.   REQUIRED self quarantine to [place] - advised to avoid all exposure with others while during Treatment. >10 days after positive test and if afebrile >3 days - may STOP self quarantine at that time.    If symptoms do not resolve or significantly improve OR if WORSENING - fever / cough - or worsening shortness of breath - then should contact us and seek advice on next steps in treatment at home vs where/when to seek  care at Urgent Care or Hospital ED for  further intervention and possible testing if indicated.  Patient verbalizes understanding with the above medical recommendations including the limitation of remote medical advice.  Specific follow-up / call-back criteria were given for patient to follow-up or seek medical care more urgently if needed.   - Time spent in direct consultation with patient on phone: 20 minutes  [name], [facility] 01/05/2020, 10:31 AM

## 2020-01-09 ENCOUNTER — Telehealth (INDEPENDENT_AMBULATORY_CARE_PROVIDER_SITE_OTHER): Payer: Managed Care, Other (non HMO) | Admitting: Family Medicine

## 2020-01-09 ENCOUNTER — Encounter: Payer: Self-pay | Admitting: Family Medicine

## 2020-01-09 ENCOUNTER — Other Ambulatory Visit: Payer: Self-pay

## 2020-01-09 DIAGNOSIS — J9801 Acute bronchospasm: Secondary | ICD-10-CM

## 2020-01-09 DIAGNOSIS — J1282 Pneumonia due to coronavirus disease 2019: Secondary | ICD-10-CM | POA: Diagnosis not present

## 2020-01-09 DIAGNOSIS — U071 COVID-19: Secondary | ICD-10-CM | POA: Diagnosis not present

## 2020-01-09 MED ORDER — PREDNISONE 10 MG PO TABS: ORAL_TABLET | ORAL | 0 refills | Status: DC

## 2020-01-09 MED ORDER — GUAIFENESIN-CODEINE 100-10 MG/5ML PO SYRP: 5.0000 mL | ORAL_SOLUTION | Freq: Four times a day (QID) | ORAL | 0 refills | Status: DC | PRN

## 2020-01-09 NOTE — Patient Instructions (Addendum)
Thank you for coming to the office today.  Start cough syrup, re order prednisone taper  New work note, return Monday, contact if needed  Use albuterol inhaler as needed  Please schedule a Follow-up Appointment to: Return in about 2 weeks (around 01/23/2020), or if symptoms worsen or fail to improve, for COVID.  If you have any other questions or concerns, please feel free to call the office or send a message through [place]. You may also schedule an earlier appointment if necessary.  Additionally, you may be receiving a survey about your experience at our office within a few days to 1 week by e-mail or mail. We value your feedback.  [name], DO [place]

## 2020-01-09 NOTE — Progress Notes (Signed)
Virtual Visit via Telephone The purpose of this virtual visit is to provide medical care while limiting exposure to the novel coronavirus (COVID19) for both patient and office staff.  Consent was obtained for phone visit:  Yes.   Answered questions that patient had about telehealth interaction:  Yes.   I discussed the limitations, risks, security and privacy concerns of performing an evaluation and management service by telephone. I also discussed with the patient that there may be a patient responsible charge related to this service. The patient expressed understanding and agreed to proceed.  Patient Location: Home Provider Location: [name])   ---------------------------------------------------------------------- Chief Complaint  Patient presents with  . Cough    follow up from 01/05/2020 chest congestion, weakness    S: Reviewed CMA documentation. I have called patient and gathered additional HPI as follows:  COVID19 with Pneumonia - Last visit with me 01/05/20, for initial visit for same problem COVID19, initial symptoms started 12/25/19, and confirmed COVID19 test positive 12/27/19. - She was treated on 01/05/20 after virtual telemedicine visit with me with Azithromycin Z-pak, Tessalon Perls, Prednisone burst and Albuterol PRN - She has improved overall but still symptomatic. She is now afebrile. She does not feel ready to return to work on [place] 9/22 - She feels fatigued and drained and tired, but overall better. Still has coughing spell worse at night with tightness in chest and wheezing improve on albuterol and prednisone. - She has done well on the medicines, takes Albuterol PRN,   Goal to return to work on next Monday 01/15/20  She is not vaccinated against [place]  Patient currently in isolation  Denies any fevers or chills Admits fatigue, cough worse at night Denies any body ache, shortness of breath, sinus pain or pressure, headache, abdominal  pain, diarrhea  -------------------------------------------------------------------------- O: No physical exam performed due to remote telephone encounter.  -------------------------------------------------------------------------- A&P:   [id number], with complication bilateral multifocal PNEUMONIA - IMPROVING   Post ED follow-up from 01/03/20 on this same issue, treated there, without other high risk features or hypoxia or new concerns. At that time CXR determine to be patchy secondary to COVID19  Improved on Azithromycin Zpak and Prednisone, albuterol PRN No history of asthma/copd but improved with treatment of bronchospasm  Repeat Prednisone burst/taper over 6 days Order Virtussin cough syrup codeine  Keep using Albuterol inhaler 2 puffs every 4-6 hours around the clock for next 2-3 days, max up to 5 days then use as needed   [employer] take 1 capsule up to 3 times a day as needed for cough   Work note written, sent to [employer] Please excuse her from any absence from work starting Wednesday 12/27/19 through Sunday 01/14/20. She may return to work on Monday 01/15/20 with standard work precautions.   No orders of the defined types were placed in this encounter.   No orders of the defined types were placed in this encounter.   OPTIONAL RECOMMENDED self quarantine for patient safety for PREVENTION ONLY.  She is >10 days from positive test and fever free, she is cleared from quarantine but given still symptomatic, should continue to remain out of work.  It is not required based on current clinical symptoms.    If symptoms do not resolve or significantly improve OR if WORSENING - fever / cough - or worsening shortness of breath - then should contact us and seek advice on next steps in treatment at home vs where/when to seek care at Urgent  Care or Hospital ED for further intervention and possible testing if indicated.  Patient verbalizes understanding with the above  medical recommendations including the limitation of remote medical advice.  Specific follow-up / call-back criteria were given for patient to follow-up or seek medical care more urgently if needed.   - Time spent in direct consultation with patient on phone: 15 minutes   [name], [facility] 01/09/2020, 9:20 AM

## 2020-01-23 ENCOUNTER — Encounter: Payer: Self-pay | Admitting: Family Medicine

## 2020-01-23 ENCOUNTER — Other Ambulatory Visit: Payer: Self-pay

## 2020-01-23 ENCOUNTER — Ambulatory Visit (INDEPENDENT_AMBULATORY_CARE_PROVIDER_SITE_OTHER): Payer: Managed Care, Other (non HMO) | Admitting: Family Medicine

## 2020-01-23 DIAGNOSIS — J9801 Acute bronchospasm: Secondary | ICD-10-CM | POA: Diagnosis not present

## 2020-01-23 DIAGNOSIS — J1282 Pneumonia due to coronavirus disease 2019: Secondary | ICD-10-CM | POA: Diagnosis not present

## 2020-01-23 DIAGNOSIS — R0600 Dyspnea, unspecified: Secondary | ICD-10-CM | POA: Diagnosis not present

## 2020-01-23 DIAGNOSIS — U071 COVID-19: Secondary | ICD-10-CM

## 2020-01-23 DIAGNOSIS — R0609 Other forms of dyspnea: Secondary | ICD-10-CM

## 2020-01-23 MED ORDER — GUAIFENESIN-CODEINE 100-10 MG/5ML PO SYRP: 5.0000 mL | ORAL_SOLUTION | Freq: Four times a day (QID) | ORAL | 0 refills | Status: DC | PRN

## 2020-01-23 MED ORDER — ALBUTEROL SULFATE HFA 108 (90 BASE) MCG/ACT IN AERS: 2.0000 | INHALATION_SPRAY | RESPIRATORY_TRACT | 0 refills | Status: DC | PRN

## 2020-01-23 MED ORDER — PREDNISONE 10 MG PO TABS: ORAL_TABLET | ORAL | 0 refills | Status: DC

## 2020-01-23 NOTE — Progress Notes (Signed)
Virtual Visit via Telephone The purpose of this virtual visit is to provide medical care while limiting exposure to the novel coronavirus (COVID19) for both patient and office staff.  Consent was obtained for phone visit:  Yes.   Answered questions that patient had about telehealth interaction:  Yes.   I discussed the limitations, risks, security and privacy concerns of performing an evaluation and management service by telephone. I also discussed with the patient that there may be a patient responsible charge related to this service. The patient expressed understanding and agreed to proceed.  Patient Location: Home Provider Location: [facility])   ---------------------------------------------------------------------- Chief Complaint  Patient presents with  . FMLA paper work    after [employer] positive    S: Reviewed CMA documentation. I have called patient and gathered additional HPI as follows:  Follow-up [id number], complication pneumonia - Background / timeline review - Initial symptoms 12/25/19 - First tested POSITIVE for COVID19 on 12/27/19 - Date of first treatment at [facility] ED on 01/03/20 - Initial apt with PCP telemedicine on 01/05/20 treated for COVID19 pneumonia with azithromycin, prednisone, albuterol, benzonatate, and follow-up telemedicine on 01/09/20, treated codeine cough syrup and repeat prednisone steroids. - Interval update with last work note written for her to return on Monday 01/15/20, however she was unable to return due to her work requested COVID test repeat - She still has symptoms of dyspnea and cough at times, using Albuterol inhaler PRN. However she remains fever free. She feels unable to return to work.  Initial excuse from work for [employer] was from 12/26/19 for 14 days after that through 01/08/20   Admits dyspnea,  Denies any fevers, chills, sweats, body ache, cough, sinus pain or pressure, headache, abdominal pain,  diarrhea  -------------------------------------------------------------------------- O: No physical exam performed due to remote telephone encounter.  I have personally reviewed the radiology report from CXR 01/03/20  DG Chest 2 ViewPerformed 01/03/2020 Final result  Study Result CLINICAL DATA: Cough, shortness of breath.  EXAM: CHEST - 2 VIEW  COMPARISON: March 09, 2019.  FINDINGS: The heart size and mediastinal contours are within normal limits. No pneumothorax or pleural effusion is noted. Patchy airspace opacities are noted throughout both lungs consistent with multifocal pneumonia. The visualized skeletal structures are unremarkable.  IMPRESSION: Bilateral multifocal pneumonia.   Electronically Signed By: Marijo Conception M.D. On: 01/03/2020 09:31   -------------------------------------------------------------------------- A&P:  [id number], with complication bilateral multifocal PNEUMONIA - Initial symptoms 12/25/19 - First tested POSITIVE for COVID19 on 12/27/19 - Last visits ED 9/15, Telemedicine 9/17 and 9/21  Persistent symptoms with dyspnea affecting her, now without fever / afebrile. No new concerns but unresolved previous symptoms.  Last CXR was 9/15, multifocal pneumonia  No history of asthma/copd but improved with treatment of bronchospasm  Plan REPEAT STAT CXR, when ready come in likely tomorrow 10/6 for X-ray, follow up pneumonia, will treat based on result, may warrant new antibiotic.  Repeat Prednisone burst/taper over 6 days Re Order Virtussin cough syrup codeine Re order Albuterol PRN  Referral to [place] for persistent complications following COVID now 1 month, may warrant PFTs, oxygen testing.  She will need FMLA paperwork, she can drop it off at our office tomorrow and, will work on it this week.  Now - FMLA for dates, start date 01/08/20 - through TBD - I would request 4 weeks from today or total of 6 weeks time out. Will recommend  that she may resume work starting 10/11 with restriction of remote work  from home only if possible.    Orders Placed This Encounter  Procedures  . DG Chest 2 View    Standing Status:   Future    Standing Expiration Date:   01/22/2021    Order Specific Question:   Reason for Exam (SYMPTOM  OR DIAGNOSIS REQUIRED)    Answer:   post-covid (postivie 12/27/19) multifocal pneumonia on 9/15, follow-up x-ray still dyspnea symptoms    Order Specific Question:   Is patient pregnant?    Answer:   No    Order Specific Question:   Preferred imaging location?    Answer:   [facility]  . Ambulatory referral to Pulmonology    Referral Priority:   Routine    Referral Type:   Consultation    Referral Reason:   Specialty Services Required    Requested Specialty:   Pulmonary Disease    Number of Visits Requested:   1      If symptoms do not resolve or significantly improve OR if WORSENING - fever / cough - or worsening shortness of breath - then should contact us and seek advice on next steps in treatment at home vs where/when to seek care at Urgent Care or Hospital ED for further intervention and possible testing if indicated.  Patient verbalizes understanding with the above medical recommendations including the limitation of remote medical advice.  Specific follow-up / call-back criteria were given for patient to follow-up or seek medical care more urgently if needed.   - Time spent in direct consultation with patient on phone: 20 minutes  [name], [facility] 01/23/2020, 9:25 AM

## 2020-01-23 NOTE — Patient Instructions (Addendum)
Thank you for coming to the office today.  [facility] [address] [place], [place] Phone: [phone]  Stay tuned for apt, they will call you. If cannot get in for several weeks, try to get in with me sooner for oxygen testing if needed.  STAT Chest X-ray tomorrow, walk in no apt needed, drop off FMLA forms for me.  Will call or message you after X-ray with results, may add another antibiotic.  Ordered cough syrup, prednisone taper, Albuterol inhaler  Will do FMLA paperwork 01/08/20 start and through 6 weeks, or 4 weeks from today. Will try to fill it out so you can return work restrictions work remotely from home 01/29/20   Please schedule a Follow-up Appointment to: Return in about 3 weeks (around 02/13/2020) for Follow-up 3-4 weeks COVID work Designer, multimedia to work.  If you have any other questions or concerns, please feel free to call the office or send a message through [place]. You may also schedule an earlier appointment if necessary.  Additionally, you may be receiving a survey about your experience at our office within a few days to 1 week by e-mail or mail. We value your feedback.  [name], DO [place]

## 2020-01-24 ENCOUNTER — Ambulatory Visit
Admission: RE | Admit: 2020-01-24 | Discharge: 2020-01-24 | Disposition: A | Discharge status: Home / Self Care | Payer: Managed Care, Other (non HMO) | Source: Ambulatory Visit | Attending: Family Medicine | Admitting: Family Medicine

## 2020-01-24 ENCOUNTER — Ambulatory Visit
Admission: RE | Admit: 2020-01-24 | Discharge: 2020-01-24 | Disposition: A | Discharge status: Home / Self Care | Payer: Managed Care, Other (non HMO) | Attending: Family Medicine | Admitting: Family Medicine

## 2020-01-24 DIAGNOSIS — U071 COVID-19: Secondary | ICD-10-CM | POA: Diagnosis present

## 2020-01-24 DIAGNOSIS — R0600 Dyspnea, unspecified: Secondary | ICD-10-CM | POA: Insufficient documentation

## 2020-01-24 DIAGNOSIS — J9801 Acute bronchospasm: Secondary | ICD-10-CM | POA: Diagnosis present

## 2020-01-24 DIAGNOSIS — J1282 Pneumonia due to coronavirus disease 2019: Secondary | ICD-10-CM | POA: Insufficient documentation

## 2020-01-24 DIAGNOSIS — R0609 Other forms of dyspnea: Secondary | ICD-10-CM

## 2020-02-14 ENCOUNTER — Ambulatory Visit (INDEPENDENT_AMBULATORY_CARE_PROVIDER_SITE_OTHER): Payer: Managed Care, Other (non HMO) | Admitting: Family Medicine

## 2020-02-14 ENCOUNTER — Encounter: Payer: Self-pay | Admitting: Family Medicine

## 2020-02-14 ENCOUNTER — Other Ambulatory Visit: Payer: Self-pay

## 2020-02-14 VITALS — BP 128/82 | HR 74 | Temp 98.4°F | Resp 18 | Ht 63.0 in | Wt 189.0 lb

## 2020-02-14 DIAGNOSIS — Z0000 Encounter for general adult medical examination without abnormal findings: Secondary | ICD-10-CM | POA: Diagnosis not present

## 2020-02-14 DIAGNOSIS — I10 Essential (primary) hypertension: Secondary | ICD-10-CM

## 2020-02-14 DIAGNOSIS — E0590 Thyrotoxicosis, unspecified without thyrotoxic crisis or storm: Secondary | ICD-10-CM

## 2020-02-14 DIAGNOSIS — F419 Anxiety disorder, unspecified: Secondary | ICD-10-CM | POA: Diagnosis not present

## 2020-02-14 DIAGNOSIS — Z1159 Encounter for screening for other viral diseases: Secondary | ICD-10-CM

## 2020-02-14 DIAGNOSIS — G43109 Migraine with aura, not intractable, without status migrainosus: Secondary | ICD-10-CM | POA: Diagnosis not present

## 2020-02-14 DIAGNOSIS — E559 Vitamin D deficiency, unspecified: Secondary | ICD-10-CM

## 2020-02-14 DIAGNOSIS — F32A Depression, unspecified: Secondary | ICD-10-CM

## 2020-02-14 DIAGNOSIS — R7989 Other specified abnormal findings of blood chemistry: Secondary | ICD-10-CM

## 2020-02-14 DIAGNOSIS — E538 Deficiency of other specified B group vitamins: Secondary | ICD-10-CM

## 2020-02-14 DIAGNOSIS — K909 Intestinal malabsorption, unspecified: Secondary | ICD-10-CM

## 2020-02-14 DIAGNOSIS — Z8616 Personal history of COVID-19: Secondary | ICD-10-CM

## 2020-02-14 MED ORDER — AMLODIPINE BESYLATE 5 MG PO TABS: 7.5000 mg | ORAL_TABLET | Freq: Every day | ORAL | 3 refills | Status: DC

## 2020-02-14 MED ORDER — BUSPIRONE HCL 7.5 MG PO TABS: 7.5000 mg | ORAL_TABLET | Freq: Three times a day (TID) | ORAL | 3 refills | Status: DC

## 2020-02-14 NOTE — Assessment & Plan Note (Signed)
Improved anxiety Controlled on Buspar 7.5 mg

## 2020-02-14 NOTE — Patient Instructions (Addendum)
Thank you for coming to the office today.  Pick a new GYN - call to schedule when ready. Next pap smear due in 04/2020 (last was 04/2017)  [facility] [address], [place], [place] [address] Main: [place]   Address: [address] [place], [place], [place], [place] [address]  ---------------------  In future when or if ready - let me know we can do a referral for reduced hearing to the Audiologist for testing.  [facility] [id number]  [place], [place] [address] Ph: [phone]   DUE for FASTING BLOOD WORK (no food or drink after midnight before the lab appointment, only water or coffee without cream/sugar on the morning of)  SCHEDULE "Lab Only" visit in the morning at the clinic for lab draw tomorrow  - Make sure Lab Only appointment is at about 1 week before your next appointment, so that results will be available  For Lab Results, once available within 2-3 days of blood draw, you can can log in to MyChart online to view your results and a brief explanation. Also, we can discuss results at next follow-up visit.   Please schedule a Follow-up Appointment to: Return in about 1 year (around 02/13/2021) for Fasting lab Tues 11/2 815am, then return 1 year for Annual Physical (fasting lab AFTER AM).  If you have any other questions or concerns, please feel free to call the office or send a message through [place]. You may also schedule an earlier appointment if necessary.  Additionally, you may be receiving a survey about your experience at our office within a few days to 1 week by e-mail or mail. We value your feedback.  [name], DO [place]

## 2020-02-14 NOTE — Assessment & Plan Note (Signed)
Stable reduced migraine headaches On Amlodipine CCB prophylaxis Imitrex PRN  Future reconsider other therapy as needed

## 2020-02-14 NOTE — Assessment & Plan Note (Signed)
Improved BP control currently on Amlodipine - Home readings improved - No complications  Plan: 1. Continue current dose Amlodipine 7.5mg  daily (5mg  tab, take 1.5 tab = 7.5mg ) daily for BP and migraine prevention 2. Continue monitor BP outside office

## 2020-02-14 NOTE — Progress Notes (Signed)
Subjective:    Patient ID: Crystal Levine, female    DOB: 1965/11/19, 54 y.o.   MRN: [id number]  Crystal Levine is a 54 y.o. female presenting on 02/14/2020 for Annual Exam   HPI  Here for Annual Physical and Lab Review.  Wellness / Lifestyle / Obesity BMI >33 No new significant chronic health concerns Weight is stable Tries to eat balanced diet  FOLLOW-UPMigraines, chronic/ Chronic HTN improved now on amlodipine low dose and also stress can provoke migraine, has improved overall, taking imitrex PRN as well Infrequent migraines, doing well on Imitrex PRN - Denies any fevers chills, loss of vision, numbness tingling weakness  Post-COVID She reports overall much improved. No further significant dyspnea. Only occasional albuterol use if short of breath.  Health Maintenance:  Colon CA Screening: Last Colonoscopy 05/27/17 (done by [facility] Dr Marius Ditch), results with no polyps, good for 10 years. Currently asymptomatic. Next due 2029. Due to adopted - No known family history of colon CA.  Breast CA Screening: Last mammogram 05/17/19 - negative. She will go every other year. Asymptomatic. No known fam history, adopted.;  Due for Flu Shot, declines today despite counseling on benefits  Not up to date on COVID vaccine. She had COVID recently.   Depression screen [facility] 2/9 01/23/2020 02/13/2019 11/22/2018  Decreased Interest 0 0 0  Down, Depressed, Hopeless 0 0 0  PHQ - 2 Score 0 0 0  Altered sleeping - - 2  Tired, decreased energy - - 0  Change in appetite - - 0  Feeling bad or failure about yourself  - - 0  Trouble concentrating - - 0  Moving slowly or fidgety/restless - - 0  Suicidal thoughts - - 0  PHQ-9 Score - - 2  Difficult doing work/chores - - Not difficult at all   GAD 7 : Generalized Anxiety Score 11/22/2018 09/23/2017 09/01/2017 09/04/2015  Nervous, Anxious, on Edge 1 1 2 1   Control/stop worrying 1 1 1 1   Worry too much - different things 1 1 3 1   Trouble relaxing 1 1 2 2     Restless 0 0 2 0  Easily annoyed or irritable 0 1 1 0  Afraid - awful might happen 1 0 1 0  Total GAD 7 Score 5 5 12 5   Anxiety Difficulty Not difficult at all Not difficult at all Somewhat difficult Not difficult at all     Past Medical History:  Diagnosis Date  . Anemia   . Anxiety   . Depression   . Hypertension   . Migraine    In past   Past Surgical History:  Procedure Laterality Date  . CHOLECYSTECTOMY    . COLONOSCOPY WITH PROPOFOL N/A 05/27/2017   Procedure: COLONOSCOPY WITH PROPOFOL;  Surgeon: Lin Landsman, MD;  Location: [facility] ENDOSCOPY;  Service: Gastroenterology;  Laterality: N/A;  . DILATION AND CURETTAGE OF UTERUS  2003   2 Miscarriages  . DILITATION & CURRETTAGE/HYSTROSCOPY WITH NOVASURE ABLATION N/A 12/16/2015   Procedure: DILATATION & CURETTAGE/HYSTEROSCOPY WITH NOVASURE ABLATION;  Surgeon: Brayton Mars, MD;  Location: [facility] ORS;  Service: Gynecology;  Laterality: N/A;  . GALLBLADDER SURGERY  2003  . GASTRIC BYPASS  2004  . GASTRIC BYPASS    . HERNIA REPAIR  2005  . HERNIA REPAIR     Social History   Socioeconomic History  . Marital status: Married    Spouse name: Not on file  . Number of children: Not on file  . Years of education:  Not on file  . Highest education level: Not on file  Occupational History  . Not on file  Tobacco Use  . Smoking status: Never Smoker  . Smokeless tobacco: Never Used  Vaping Use  . Vaping Use: Never used  Substance and Sexual Activity  . Alcohol use: No  . Drug use: No  . Sexual activity: Yes    Comment: vasectomy  Other Topics Concern  . Not on file  Social History Narrative   ** Merged History Encounter **       Social Determinants of Health   Financial Resource Strain:   . Difficulty of Paying Living Expenses: Not on file  Food Insecurity:   . Worried About Charity fundraiser in the Last Year: Not on file  . Ran Out of Food in the Last Year: Not on file  Transportation Needs:   . Lack of  Transportation (Medical): Not on file  . Lack of Transportation (Non-Medical): Not on file  Physical Activity:   . Days of Exercise per Week: Not on file  . Minutes of Exercise per Session: Not on file  Stress:   . Feeling of Stress : Not on file  Social Connections:   . Frequency of Communication with Friends and Family: Not on file  . Frequency of Social Gatherings with Friends and Family: Not on file  . Attends Religious Services: Not on file  . Active Member of Clubs or Organizations: Not on file  . Attends Archivist Meetings: Not on file  . Marital Status: Not on file  Intimate Partner Violence:   . Fear of Current or Ex-Partner: Not on file  . Emotionally Abused: Not on file  . Physically Abused: Not on file  . Sexually Abused: Not on file   Family History  Adopted: Yes  Family history unknown: Yes   Current Outpatient Medications on File Prior to Visit  Medication Sig  . cyanocobalamin (,VITAMIN B-12,) 1000 MCG/ML injection ADMINISTER 1 ML(1000 MCG) IN THE MUSCLE EVERY 30 DAYS  . SUMAtriptan (IMITREX) 50 MG tablet TAKE 1 TO 2 TABLETS BY MOUTH 1 TIME AS NEEDED FOR MIGRAINE. MAY REPEAT IN 2 HOURS IF HEADACHE PERSISTS   No current facility-administered medications on file prior to visit.    Review of Systems  Constitutional: Negative for activity change, appetite change, chills, diaphoresis, fatigue and fever.  HENT: Negative for congestion and hearing loss.   Eyes: Negative for visual disturbance.  Respiratory: Negative for apnea, cough, chest tightness, shortness of breath and wheezing.   Cardiovascular: Negative for chest pain, palpitations and leg swelling.  Gastrointestinal: Negative for abdominal pain, anal bleeding, blood in stool, constipation, diarrhea, nausea and vomiting.  Endocrine: Negative for cold intolerance.  Genitourinary: Negative for difficulty urinating, dysuria, frequency and hematuria.  Musculoskeletal: Negative for arthralgias, back pain  and neck pain.  Skin: Negative for rash.  Allergic/Immunologic: Negative for environmental allergies.  Neurological: Negative for dizziness, weakness, light-headedness, numbness and headaches.  Hematological: Negative for adenopathy.  Psychiatric/Behavioral: Negative for behavioral problems, dysphoric mood and sleep disturbance. The patient is not nervous/anxious.    Per HPI unless specifically indicated above      Objective:    BP 128/82 (BP Location: Left Arm, Patient Position: Sitting, Cuff Size: Normal)   Pulse 74   Temp 98.4 F (36.9 C) (Oral)   Resp 18   Ht 5\' 3"  (1.6 m)   Wt 189 lb (85.7 kg)   SpO2 99%   BMI 33.48 kg/m  Wt Readings from Last 3 Encounters:  02/14/20 189 lb (85.7 kg)  01/03/20 185 lb (83.9 kg)  03/20/19 191 lb (86.6 kg)    Physical Exam Vitals and nursing note reviewed.  Constitutional:      General: She is not in acute distress.    Appearance: She is well-developed. She is not diaphoretic.     Comments: Well-appearing, comfortable, cooperative  HENT:     Head: Normocephalic and atraumatic.  Eyes:     General:        Right eye: No discharge.        Left eye: No discharge.     Conjunctiva/sclera: Conjunctivae normal.     Pupils: Pupils are equal, round, and reactive to light.  Neck:     Thyroid: No thyromegaly.     Vascular: No carotid bruit.  Cardiovascular:     Rate and Rhythm: Normal rate and regular rhythm.     Heart sounds: Normal heart sounds. No murmur heard.   Pulmonary:     Effort: Pulmonary effort is normal. No respiratory distress.     Breath sounds: Normal breath sounds. No wheezing or rales.  Abdominal:     General: Bowel sounds are normal. There is no distension.     Palpations: Abdomen is soft. There is no mass.     Tenderness: There is no abdominal tenderness.  Musculoskeletal:        General: No tenderness. Normal range of motion.     Cervical back: Normal range of motion and neck supple.     Comments: Upper / Lower  Extremities: - Normal muscle tone, strength bilateral upper extremities 5/5, lower extremities 5/5  Lymphadenopathy:     Cervical: No cervical adenopathy.  Skin:    General: Skin is warm and dry.     Findings: No erythema or rash.  Neurological:     Mental Status: She is alert and oriented to person, place, and time.     Comments: Distal sensation intact to light touch all extremities  Psychiatric:        Behavior: Behavior normal.     Comments: Well groomed, good eye contact, normal speech and thoughts       Results for orders placed or performed during the hospital encounter of 41/74/08  Basic metabolic panel  Result Value Ref Range   Sodium 138 135 - 145 mmol/L   Potassium 3.3 (L) 3.5 - 5.1 mmol/L   Chloride 106 98 - 111 mmol/L   CO2 20 (L) 22 - 32 mmol/L   Glucose, Bld 93 70 - 99 mg/dL   BUN 9 6 - 20 mg/dL   Creatinine, Ser 0.83 0.44 - 1.00 mg/dL   Calcium 8.5 (L) 8.9 - 10.3 mg/dL   GFR calc non Af Amer >60 >60 mL/min   GFR calc Af Amer >60 >60 mL/min   Anion gap 12 5 - 15  CBC with Differential  Result Value Ref Range   WBC 3.8 (L) 4.0 - 10.5 K/uL   RBC 5.29 (H) 3.87 - 5.11 MIL/uL   Hemoglobin 16.2 (H) 12.0 - 15.0 g/dL   HCT 45.7 36 - 46 %   MCV 86.4 80.0 - 100.0 fL   MCH 30.6 26.0 - 34.0 pg   MCHC 35.4 30.0 - 36.0 g/dL   RDW 12.6 11.5 - 15.5 %   Platelets 129 (L) 150 - 400 K/uL   nRBC 0.0 0.0 - 0.2 %   Neutrophils Relative % 65 %   Neutro Abs 2.4  1.7 - 7.7 K/uL   Lymphocytes Relative 27 %   Lymphs Abs 1.0 0.7 - 4.0 K/uL   Monocytes Relative 6 %   Monocytes Absolute 0.2 0.1 - 1.0 K/uL   Eosinophils Relative 0 %   Eosinophils Absolute 0.0 0.0 - 0.5 K/uL   Basophils Relative 1 %   Basophils Absolute 0.0 0.0 - 0.1 K/uL   RBC Morphology MORPHOLOGY UNREMARKABLE    Smear Review MORPHOLOGY UNREMARKABLE    Immature Granulocytes 1 %   Abs Immature Granulocytes 0.05 0.00 - 0.07 K/uL   Reactive, Benign Lymphocytes PRESENT       Assessment & Plan:   Problem List  Items Addressed This Visit    Vitamin D deficiency   Relevant Orders   VITAMIN D 25 Hydroxy (Vit-D Deficiency, Fractures)   Vitamin B12 deficiency due to intestinal malabsorption   Relevant Orders   Vitamin B12   Subclinical hyperthyroidism   Relevant Orders   TSH   T4, free   Migraine with aura and without status migrainosus, not intractable    Stable reduced migraine headaches On Amlodipine CCB prophylaxis Imitrex PRN  Future reconsider other therapy as needed      Relevant Medications   amLODipine (NORVASC) 5 MG tablet   Other Relevant Orders   COMPLETE METABOLIC PANEL WITH GFR   Low TSH level   Relevant Orders   TSH   T4, free   Iron malabsorption   Relevant Orders   CBC with Differential/Platelet   Essential hypertension    Improved BP control currently on Amlodipine - Home readings improved - No complications  Plan: 1. Continue current dose Amlodipine 7.5mg  daily (5mg  tab, take 1.5 tab = 7.5mg ) daily for BP and migraine prevention 2. Continue monitor BP outside office      Relevant Medications   amLODipine (NORVASC) 5 MG tablet   Other Relevant Orders   CBC with Differential/Platelet   COMPLETE METABOLIC PANEL WITH GFR   Anxiety    Improved anxiety Controlled on Buspar 7.5 mg        Relevant Medications   busPIRone (BUSPAR) 7.5 MG tablet    Other Visit Diagnoses    Annual physical exam    -  Primary   Relevant Orders   Hemoglobin A1c   CBC with Differential/Platelet   COMPLETE METABOLIC PANEL WITH GFR   Lipid panel   History of COVID-19       Relevant Orders   SARS-CoV-2 Semi-Quantitative Total Antibody, Spike   Need for hepatitis C screening test       Relevant Orders   Hepatitis C antibody      Updated Health Maintenance information - Declines Flu Shot, COVID vaccine - Will check COVID antibody given recent history covid Labs ordered today, return 1 week for fasting lab. Encouraged improvement to lifestyle with diet and exercise - Goal  of weight loss  Orders Placed This Encounter  Procedures  . SARS-CoV-2 Semi-Quantitative Total Antibody, Spike    Order Specific Question:   Is this test for diagnosis or screening    Answer:   Screening    Order Specific Question:   Symptomatic for COVID-19 as defined by CDC    Answer:   No    Order Specific Question:   Hospitalized for COVID-19    Answer:   No    Order Specific Question:   Admitted to ICU for COVID-19    Answer:   No    Order Specific Question:   Previously tested for  COVID-19    Answer:   Yes    Order Specific Question:   Resident in a congregate (group) care setting    Answer:   No    Order Specific Question:   Employed in healthcare setting    Answer:   No    Order Specific Question:   Pregnant    Answer:   No  . TSH  . T4, free  . Hemoglobin A1c  . CBC with Differential/Platelet  . COMPLETE METABOLIC PANEL WITH GFR  . Lipid panel    Order Specific Question:   Has the patient fasted?    Answer:   Yes  . Vitamin B12  . VITAMIN D 25 Hydroxy (Vit-D Deficiency, Fractures)  . Hepatitis C antibody     Meds ordered this encounter  Medications  . amLODipine (NORVASC) 5 MG tablet    Sig: Take 1.5 tablets (7.5 mg total) by mouth daily.    Dispense:  135 tablet    Refill:  3  . busPIRone (BUSPAR) 7.5 MG tablet    Sig: Take 1 tablet (7.5 mg total) by mouth 3 (three) times daily.    Dispense:  270 tablet    Refill:  3     Follow up plan: Return in about 1 year (around 02/13/2021) for Fasting lab Tues 11/2 815am, then return 1 year for Annual Physical (fasting lab AFTER AM).     [name], [facility] 02/14/2020, 1:35 PM

## 2020-02-22 LAB — LIPID PANEL
Cholesterol: 150 mg/dL (ref <200)
HDL: 71 mg/dL (ref >=50)
LDL Cholesterol (Calc): 61 mg/dL (calc)
Non-HDL Cholesterol (Calc): 79 mg/dL (calc) (ref <130)
Total CHOL/HDL Ratio: 2.1 (calc) (ref <5.0)
Triglycerides: 97 mg/dL (ref <150)

## 2020-02-22 LAB — CBC WITH DIFFERENTIAL/PLATELET
Absolute Monocytes: 257 cells/uL (ref 200–950)
Basophils Absolute: 19 cells/uL (ref 0–200)
Basophils Relative: 0.6 %
Eosinophils Absolute: 149 cells/uL (ref 15–500)
Eosinophils Relative: 4.8 %
HCT: 41.5 % (ref 35.0–45.0)
Hemoglobin: 13.7 g/dL (ref 11.7–15.5)
Lymphs Abs: 1451 cells/uL (ref 850–3900)
MCH: 31.3 pg (ref 27.0–33.0)
MCHC: 33 g/dL (ref 32.0–36.0)
MCV: 94.7 fL (ref 80.0–100.0)
MPV: 11.8 fL (ref 7.5–12.5)
Monocytes Relative: 8.3 %
Neutro Abs: 1225 cells/uL — ABNORMAL LOW (ref 1500–7800)
Neutrophils Relative %: 39.5 %
Platelets: 196 10*3/uL (ref 140–400)
RBC: 4.38 10*6/uL (ref 3.80–5.10)
RDW: 13.5 % (ref 11.0–15.0)
Total Lymphocyte: 46.8 %
WBC: 3.1 10*3/uL — ABNORMAL LOW (ref 3.8–10.8)

## 2020-02-22 LAB — COMPLETE METABOLIC PANEL WITH GFR
AG Ratio: 1.9 (calc) (ref 1.0–2.5)
ALT: 26 U/L (ref 6–29)
AST: 24 U/L (ref 10–35)
Albumin: 3.9 g/dL (ref 3.6–5.1)
Alkaline phosphatase (APISO): 72 U/L (ref 37–153)
BUN: 9 mg/dL (ref 7–25)
CO2: 22 mmol/L (ref 20–32)
Calcium: 8.7 mg/dL (ref 8.6–10.4)
Chloride: 110 mmol/L (ref 98–110)
Creat: 0.55 mg/dL (ref 0.50–1.05)
GFR, Est African American: 125 mL/min/{1.73_m2} (ref >=60)
GFR, Est Non African American: 108 mL/min/{1.73_m2} (ref >=60)
Globulin: 2.1 g/dL (calc) (ref 1.9–3.7)
Glucose, Bld: 79 mg/dL (ref 65–99)
Potassium: 4.1 mmol/L (ref 3.5–5.3)
Sodium: 140 mmol/L (ref 135–146)
Total Bilirubin: 0.6 mg/dL (ref 0.2–1.2)
Total Protein: 6 g/dL — ABNORMAL LOW (ref 6.1–8.1)

## 2020-02-22 LAB — VITAMIN D 25 HYDROXY (VIT D DEFICIENCY, FRACTURES): Vit D, 25-Hydroxy: 20 ng/mL — ABNORMAL LOW (ref 30–100)

## 2020-02-22 LAB — HEPATITIS C ANTIBODY
Hepatitis C Ab: NONREACTIVE
SIGNAL TO CUT-OFF: 0 (ref <1.00)

## 2020-02-22 LAB — T4, FREE: Free T4: 0.9 ng/dL (ref 0.8–1.8)

## 2020-02-22 LAB — SARS-COV-2 SEMI-QUANTITATIVE TOTAL ANTIBODY, SPIKE: SARS COV2 AB, Total Spike Semi QN: 538.7 U/mL — ABNORMAL HIGH (ref <0.8)

## 2020-02-22 LAB — TSH: TSH: 0.92 mIU/L

## 2020-02-22 LAB — HEMOGLOBIN A1C
Hgb A1c MFr Bld: 4.4 % of total Hgb (ref <5.7)
Mean Plasma Glucose: 80 (calc)
eAG (mmol/L): 4.4 (calc)

## 2020-02-22 LAB — VITAMIN B12: Vitamin B-12: 254 pg/mL (ref 200–1100)

## 2020-10-22 ENCOUNTER — Other Ambulatory Visit: Payer: Self-pay | Admitting: Family Medicine

## 2020-10-22 DIAGNOSIS — G43109 Migraine with aura, not intractable, without status migrainosus: Secondary | ICD-10-CM

## 2020-10-22 NOTE — Telephone Encounter (Signed)
   Notes to clinic:  Script has expired  Review for continued use and refill    Requested Prescriptions  Pending Prescriptions Disp Refills   SUMAtriptan (IMITREX) 50 MG tablet [Pharmacy Med Name: SUMATRIPTAN 50MG  TABLETS] 12 tablet 3    Sig: TAKE 1 TO 2 TABLETS BY MOUTH 1 TIME AS NEEDED FOR MIGRAINE. MAY REPEAT IN 2 HOURS IF HEADACHE PERSISTS      Neurology:  Migraine Therapy - Triptan Passed - 10/22/2020 10:32 AM      Passed - Last BP in normal range    BP Readings from Last 1 Encounters:  02/14/20 128/82          Passed - Valid encounter within last 12 months    Recent Outpatient Visits           8 months ago Annual physical exam   [place], DO   9 months ago Dyspnea on exertion   [place], DO   9 months ago COVID-19 virus infection   [place], DO   9 months ago Pneumonia due to COVID-19 virus   [place], Devonne Doughty, DO   1 year ago Annual physical exam   [place], Devonne Doughty, DO

## 2021-01-22 ENCOUNTER — Other Ambulatory Visit: Payer: Self-pay

## 2021-01-22 DIAGNOSIS — R7989 Other specified abnormal findings of blood chemistry: Secondary | ICD-10-CM

## 2021-01-22 DIAGNOSIS — Z0000 Encounter for general adult medical examination without abnormal findings: Secondary | ICD-10-CM

## 2021-01-22 DIAGNOSIS — E669 Obesity, unspecified: Secondary | ICD-10-CM

## 2021-01-22 DIAGNOSIS — I10 Essential (primary) hypertension: Secondary | ICD-10-CM

## 2021-02-07 IMAGING — CR DG CHEST 2V
2 series · 2 of 2 positions shown · non-contrast
Comparison: June 21, 2017

CLINICAL DATA: Chest pain and shortness of breath

EXAM:
CHEST - 2 VIEW

[chest pa]
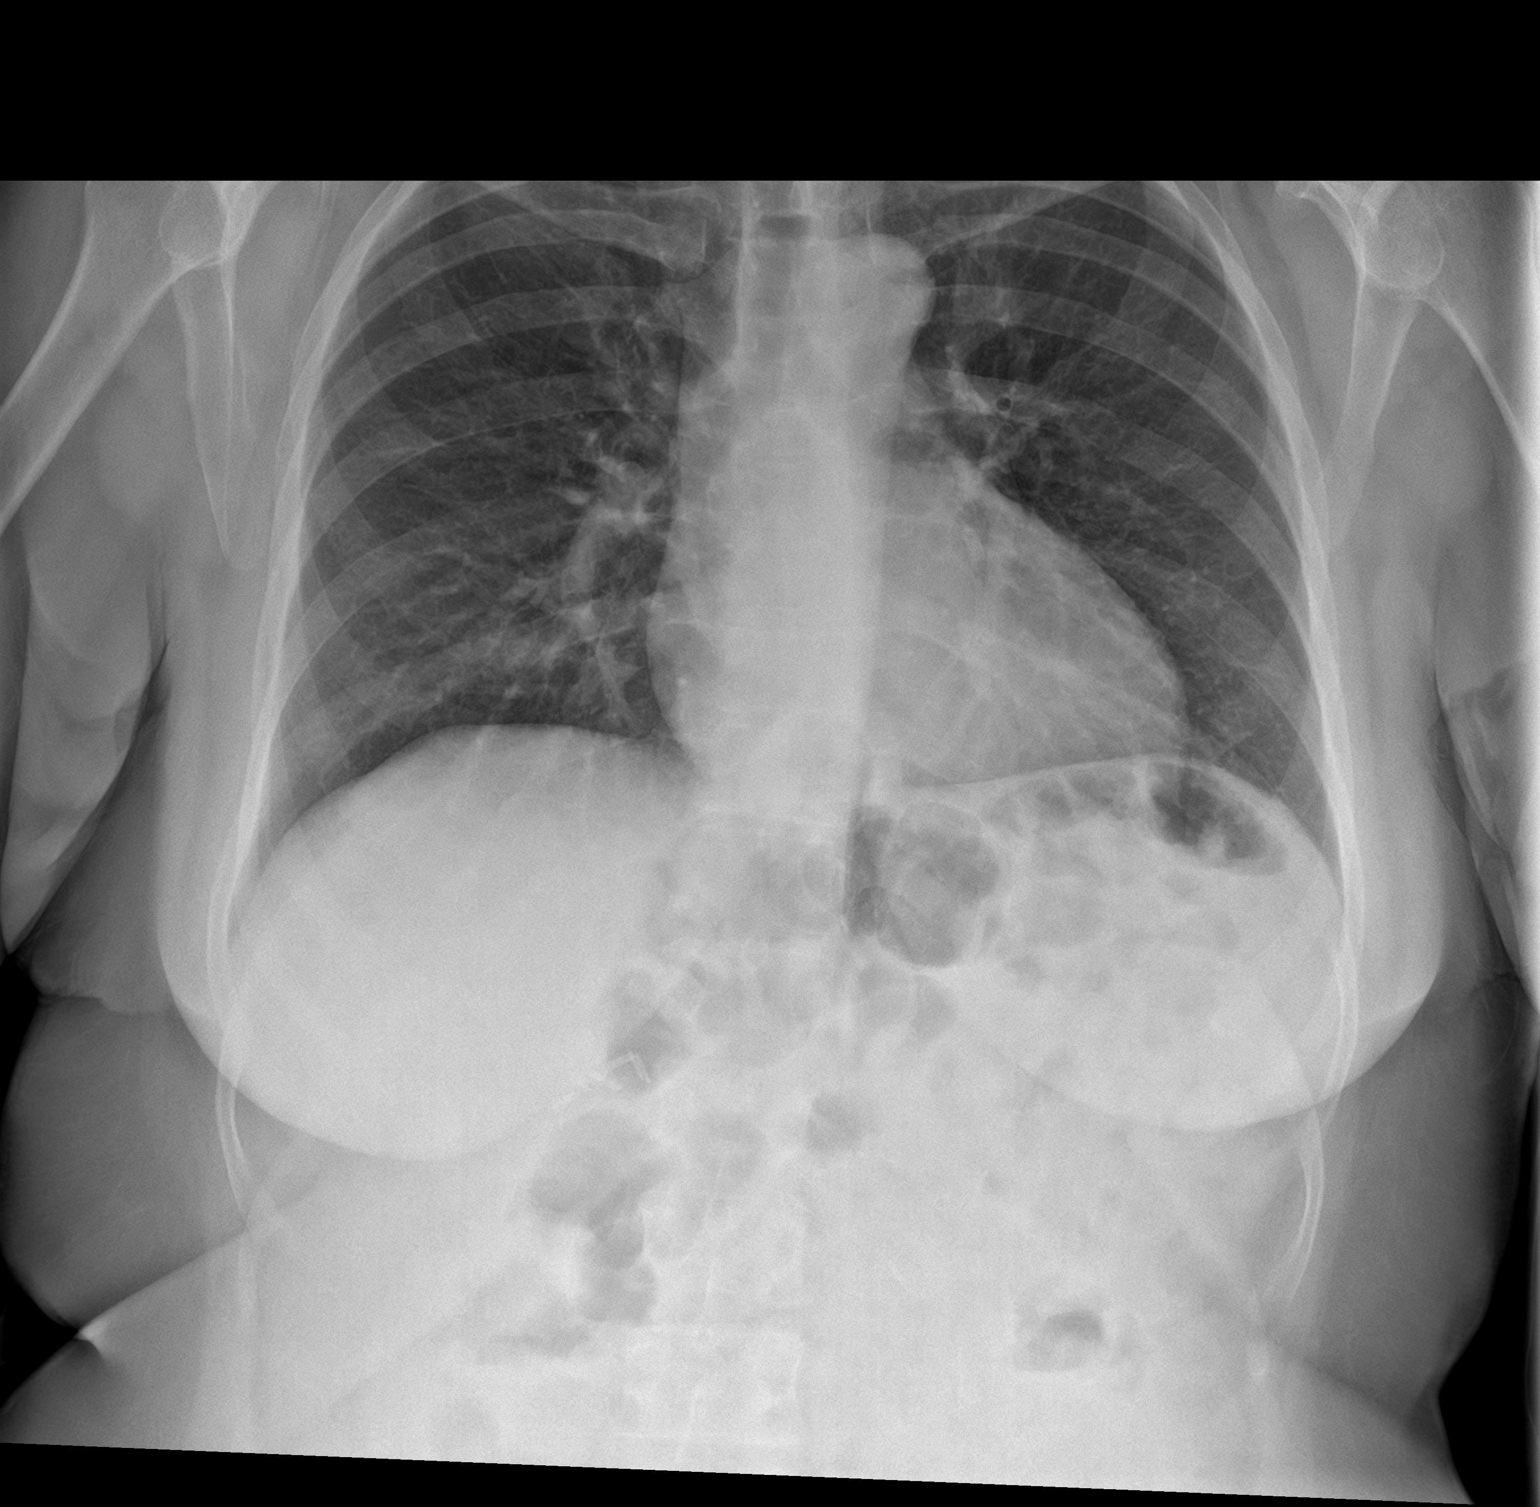

[chest lat]
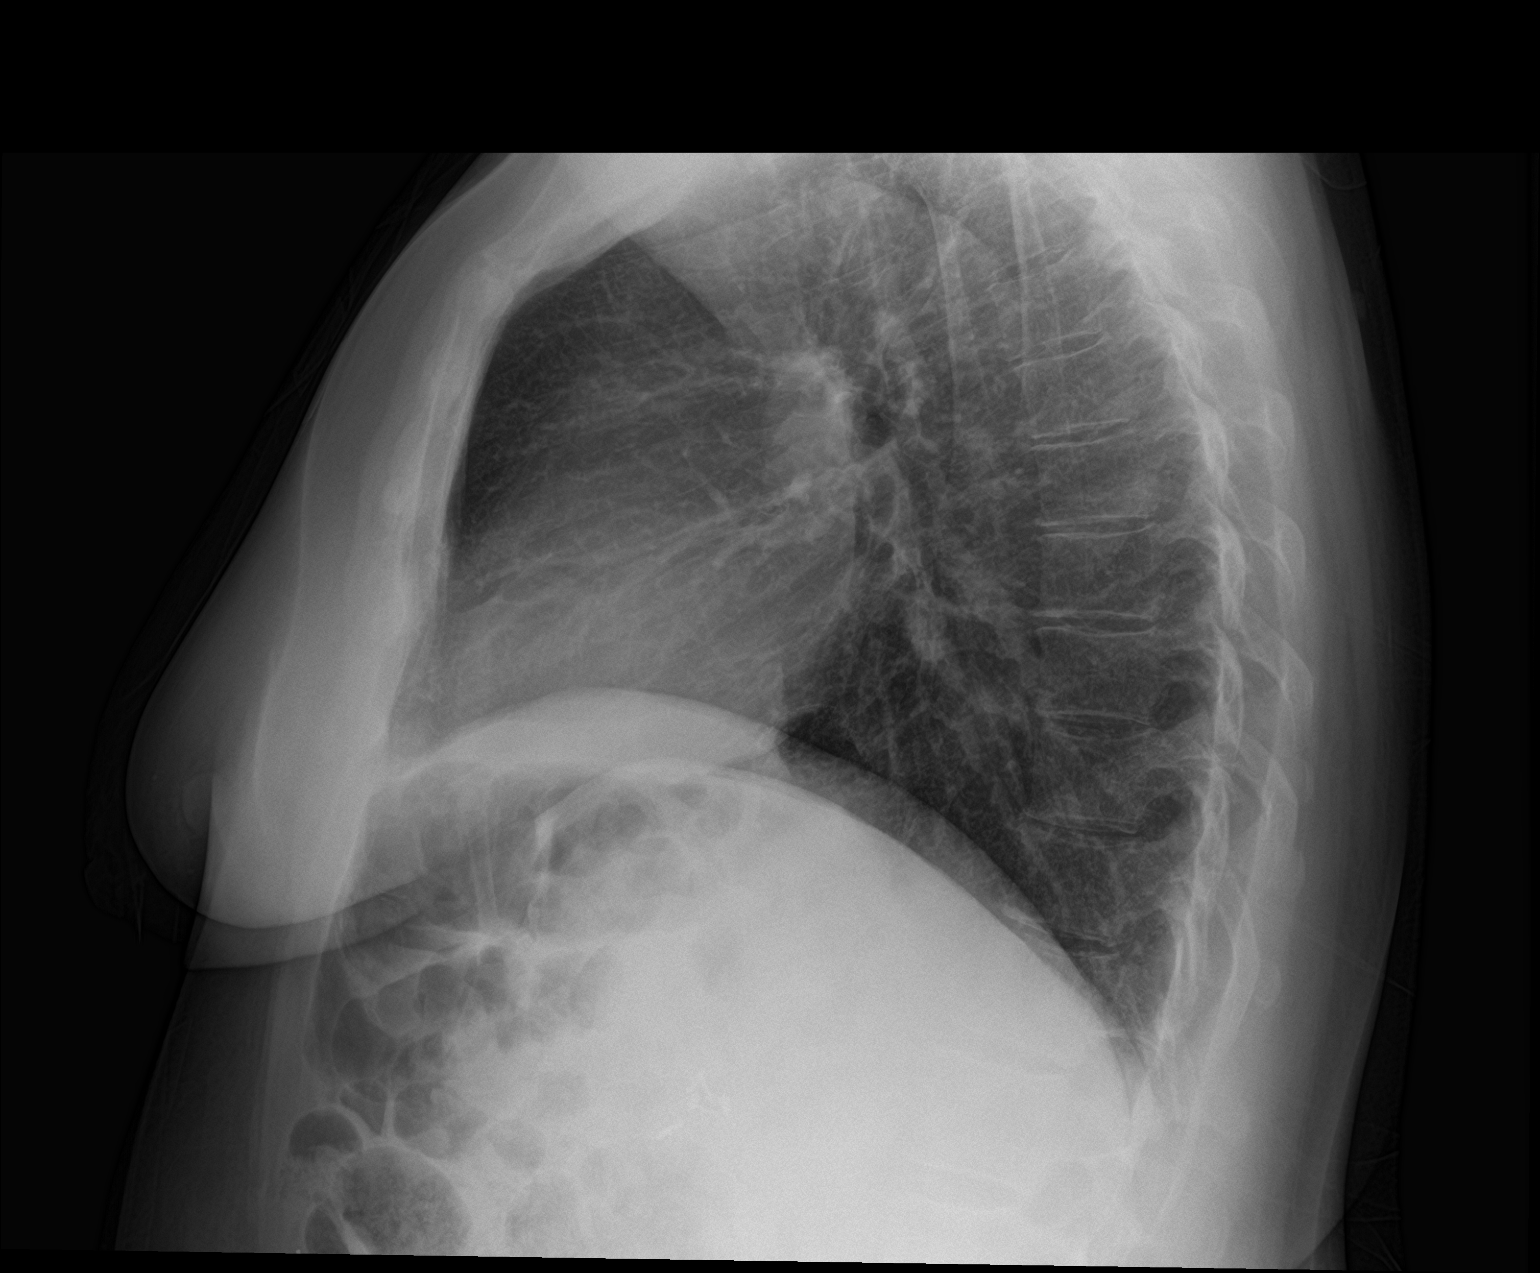

[2 of 2 positions shown; findings below may reference images not displayed]

FINDINGS: Lungs are clear. Heart size and pulmonary vascularity are normal. No
adenopathy. No pneumothorax. No bone lesions. There are surgical
clips in the right upper abdomen.
IMPRESSION: No edema or consolidation.

## 2021-02-10 ENCOUNTER — Other Ambulatory Visit: Payer: Self-pay

## 2021-02-10 DIAGNOSIS — I10 Essential (primary) hypertension: Secondary | ICD-10-CM

## 2021-02-10 DIAGNOSIS — Z0000 Encounter for general adult medical examination without abnormal findings: Secondary | ICD-10-CM

## 2021-02-10 DIAGNOSIS — E669 Obesity, unspecified: Secondary | ICD-10-CM

## 2021-02-10 DIAGNOSIS — R7989 Other specified abnormal findings of blood chemistry: Secondary | ICD-10-CM

## 2021-02-11 ENCOUNTER — Other Ambulatory Visit: Payer: Self-pay

## 2021-02-11 ENCOUNTER — Other Ambulatory Visit: Payer: Managed Care, Other (non HMO)

## 2021-02-12 LAB — CBC WITH DIFFERENTIAL/PLATELET
Absolute Monocytes: 231 cells/uL (ref 200–950)
Basophils Absolute: 32 cells/uL (ref 0–200)
Basophils Relative: 0.9 %
Eosinophils Absolute: 140 cells/uL (ref 15–500)
Eosinophils Relative: 4 %
HCT: 46.5 % — ABNORMAL HIGH (ref 35.0–45.0)
Hemoglobin: 15.7 g/dL — ABNORMAL HIGH (ref 11.7–15.5)
Lymphs Abs: 1274 cells/uL (ref 850–3900)
MCH: 31.3 pg (ref 27.0–33.0)
MCHC: 33.8 g/dL (ref 32.0–36.0)
MCV: 92.8 fL (ref 80.0–100.0)
MPV: 11.8 fL (ref 7.5–12.5)
Monocytes Relative: 6.6 %
Neutro Abs: 1824 cells/uL (ref 1500–7800)
Neutrophils Relative %: 52.1 %
Platelets: 166 10*3/uL (ref 140–400)
RBC: 5.01 10*6/uL (ref 3.80–5.10)
RDW: 12.2 % (ref 11.0–15.0)
Total Lymphocyte: 36.4 %
WBC: 3.5 10*3/uL — ABNORMAL LOW (ref 3.8–10.8)

## 2021-02-12 LAB — LIPID PANEL
Cholesterol: 153 mg/dL (ref <200)
HDL: 70 mg/dL (ref >=50)
LDL Cholesterol (Calc): 65 mg/dL (calc)
Non-HDL Cholesterol (Calc): 83 mg/dL (calc) (ref <130)
Total CHOL/HDL Ratio: 2.2 (calc) (ref <5.0)
Triglycerides: 101 mg/dL (ref <150)

## 2021-02-12 LAB — COMPREHENSIVE METABOLIC PANEL
AG Ratio: 1.9 (calc) (ref 1.0–2.5)
ALT: 10 U/L (ref 6–29)
AST: 15 U/L (ref 10–35)
Albumin: 4.4 g/dL (ref 3.6–5.1)
Alkaline phosphatase (APISO): 76 U/L (ref 37–153)
BUN: 11 mg/dL (ref 7–25)
CO2: 25 mmol/L (ref 20–32)
Calcium: 9.3 mg/dL (ref 8.6–10.4)
Chloride: 109 mmol/L (ref 98–110)
Creat: 0.62 mg/dL (ref 0.50–1.03)
Globulin: 2.3 g/dL (calc) (ref 1.9–3.7)
Glucose, Bld: 79 mg/dL (ref 65–99)
Potassium: 4.2 mmol/L (ref 3.5–5.3)
Sodium: 141 mmol/L (ref 135–146)
Total Bilirubin: 0.5 mg/dL (ref 0.2–1.2)
Total Protein: 6.7 g/dL (ref 6.1–8.1)

## 2021-02-12 LAB — HEMOGLOBIN A1C
Hgb A1c MFr Bld: 4.5 % of total Hgb (ref <5.7)
Mean Plasma Glucose: 82 mg/dL
eAG (mmol/L): 4.6 mmol/L

## 2021-02-12 LAB — TSH: TSH: 0.61 mIU/L

## 2021-02-12 LAB — T4, FREE: Free T4: 1.1 ng/dL (ref 0.8–1.8)

## 2021-02-17 ENCOUNTER — Ambulatory Visit (INDEPENDENT_AMBULATORY_CARE_PROVIDER_SITE_OTHER): Payer: Managed Care, Other (non HMO) | Admitting: Family Medicine

## 2021-02-17 ENCOUNTER — Other Ambulatory Visit: Payer: Self-pay

## 2021-02-17 ENCOUNTER — Encounter: Payer: Self-pay | Admitting: Family Medicine

## 2021-02-17 VITALS — BP 137/84 | HR 73 | Ht 63.0 in | Wt 184.8 lb

## 2021-02-17 DIAGNOSIS — Z1231 Encounter for screening mammogram for malignant neoplasm of breast: Secondary | ICD-10-CM

## 2021-02-17 DIAGNOSIS — I10 Essential (primary) hypertension: Secondary | ICD-10-CM

## 2021-02-17 DIAGNOSIS — Z0000 Encounter for general adult medical examination without abnormal findings: Secondary | ICD-10-CM

## 2021-02-17 MED ORDER — AMLODIPINE BESYLATE 5 MG PO TABS: 7.5000 mg | ORAL_TABLET | Freq: Every day | ORAL | 3 refills | Status: DC

## 2021-02-17 NOTE — Assessment & Plan Note (Signed)
Controlled BP back on Amlodipine - Home readings improved - No complications  Plan: 1. Continue current dose Amlodipine 7.5mg  daily (5mg  tab, take 1.5 tab = 7.5mg ) daily for BP and migraine prevention 2. Continue monitor BP outside office

## 2021-02-17 NOTE — Progress Notes (Signed)
Subjective:    Patient ID: Crystal Levine, female    DOB: 10/06/1965, 55 y.o.   MRN: [id number]  Crystal Levine is a 55 y.o. female presenting on 02/17/2021 for Annual Exam   HPI  Here for Annual Physical and Lab Review.   Wellness / Lifestyle / Obesity BMI >32 No new significant chronic health concerns Weight is stable Tries to eat balanced diet  Thyroid panel in normal range. Not on medication   FOLLOW-UP Migraines, chronic / Chronic HTN Had issues when skipped some amlodipine, now restarted. She had break through headaches when BP raised off med. She does better on Amlodipine 7.5mg  daily Infrequent migraines, doing well on Imitrex PRN - Denies any fevers chills, loss of vision, numbness tingling weakness  Anxiety Feels that she no longer needs buspar at this time. Caused GI side effect   Health Maintenance:   Colon CA Screening: Last Colonoscopy 05/27/17 (done by [facility] Dr Marius Ditch), results with no polyps, good for 10 years. Currently asymptomatic. Next due 2029. Due to adopted - No known family history of colon CA.   Breast CA Screening: Last mammogram 05/17/19 - negative. She will go every other year. Next in 04/2020. Asymptomatic. No known fam history, adopted.   Due for Flu Shot, declines today despite counseling on benefits   Not up to date on COVID vaccine. She had COVID recently.  Last pap smear 04/21/17, negative including HPV co testing. Will give handout on new GYN.   Depression screen [facility] 2/9 02/17/2021 01/23/2020 02/13/2019  Decreased Interest 0 0 0  Down, Depressed, Hopeless 0 0 0  PHQ - 2 Score 0 0 0  Altered sleeping - - -  Tired, decreased energy - - -  Change in appetite - - -  Feeling bad or failure about yourself  - - -  Trouble concentrating - - -  Moving slowly or fidgety/restless - - -  Suicidal thoughts - - -  PHQ-9 Score - - -  Difficult doing work/chores - - -    Past Medical History:  Diagnosis Date   Anemia    Anxiety    Depression     Hypertension    Migraine    In past   Past Surgical History:  Procedure Laterality Date   CHOLECYSTECTOMY     COLONOSCOPY WITH PROPOFOL N/A 05/27/2017   Procedure: COLONOSCOPY WITH PROPOFOL;  Surgeon: Lin Landsman, MD;  Location: [facility] ENDOSCOPY;  Service: Gastroenterology;  Laterality: N/A;   DILATION AND CURETTAGE OF UTERUS  2003   2 Miscarriages   DILITATION & CURRETTAGE/HYSTROSCOPY WITH NOVASURE ABLATION N/A 12/16/2015   Procedure: DILATATION & CURETTAGE/HYSTEROSCOPY WITH NOVASURE ABLATION;  Surgeon: Brayton Mars, MD;  Location: [facility] ORS;  Service: Gynecology;  Laterality: N/A;   GALLBLADDER SURGERY  2003   GASTRIC BYPASS  2004   GASTRIC BYPASS     HERNIA REPAIR  2005   HERNIA REPAIR     Social History   Socioeconomic History   Marital status: Married    Spouse name: Not on file   Number of children: Not on file   Years of education: Not on file   Highest education level: Not on file  Occupational History   Not on file  Tobacco Use   Smoking status: Never   Smokeless tobacco: Never  Vaping Use   Vaping Use: Never used  Substance and Sexual Activity   Alcohol use: No   Drug use: No   Sexual activity: Yes  Comment: vasectomy  Other Topics Concern   Not on file  Social History Narrative   ** Merged History Encounter **       Social Determinants of Health   Financial Resource Strain: Not on file  Food Insecurity: Not on file  Transportation Needs: Not on file  Physical Activity: Not on file  Stress: Not on file  Social Connections: Not on file  Intimate Partner Violence: Not on file   Family History  Adopted: Yes  Family history unknown: Yes   Current Outpatient Medications on File Prior to Visit  Medication Sig   cyanocobalamin (,VITAMIN B-12,) 1000 MCG/ML injection ADMINISTER 1 ML(1000 MCG) IN THE MUSCLE EVERY 30 DAYS   diclofenac Sodium (VOLTAREN) 1 % GEL Apply 1 application topically 4 (four) times daily.   ketorolac (TORADOL) 10 MG  tablet Take 10 mg by mouth every 6 (six) hours as needed.   SUMAtriptan (IMITREX) 50 MG tablet TAKE 1 TO 2 TABLETS BY MOUTH 1 TIME AS NEEDED FOR MIGRAINE. MAY REPEAT IN 2 HOURS IF HEADACHE PERSISTS   No current facility-administered medications on file prior to visit.    Review of Systems  Constitutional:  Negative for activity change, appetite change, chills, diaphoresis, fatigue and fever.  HENT:  Negative for congestion and hearing loss.   Eyes:  Negative for visual disturbance.  Respiratory:  Negative for cough, chest tightness, shortness of breath and wheezing.   Cardiovascular:  Negative for chest pain, palpitations and leg swelling.  Gastrointestinal:  Negative for abdominal pain, constipation, diarrhea, nausea and vomiting.  Genitourinary:  Negative for dysuria, frequency and hematuria.  Musculoskeletal:  Positive for arthralgias (L Knee). Negative for neck pain.  Skin:  Negative for rash.  Neurological:  Negative for dizziness, weakness, light-headedness, numbness and headaches.  Hematological:  Negative for adenopathy.  Psychiatric/Behavioral:  Negative for behavioral problems, dysphoric mood and sleep disturbance.   Per HPI unless specifically indicated above      Objective:    BP 137/84   Pulse 73   Ht 5\' 3"  (1.6 m)   Wt 184 lb 12.8 oz (83.8 kg)   SpO2 100%   BMI 32.74 kg/m   Wt Readings from Last 3 Encounters:  02/17/21 184 lb 12.8 oz (83.8 kg)  02/14/20 189 lb (85.7 kg)  01/03/20 185 lb (83.9 kg)    Physical Exam Vitals and nursing note reviewed.  Constitutional:      General: She is not in acute distress.    Appearance: She is well-developed. She is not diaphoretic.     Comments: Well-appearing, comfortable, cooperative  HENT:     Head: Normocephalic and atraumatic.  Eyes:     General:        Right eye: No discharge.        Left eye: No discharge.     Conjunctiva/sclera: Conjunctivae normal.     Pupils: Pupils are equal, round, and reactive to light.   Neck:     Thyroid: No thyromegaly.  Cardiovascular:     Rate and Rhythm: Normal rate and regular rhythm.     Pulses: Normal pulses.     Heart sounds: Normal heart sounds. No murmur heard. Pulmonary:     Effort: Pulmonary effort is normal. No respiratory distress.     Breath sounds: Normal breath sounds. No wheezing or rales.  Abdominal:     General: Bowel sounds are normal. There is no distension.     Palpations: Abdomen is soft. There is no mass.  Tenderness: There is no abdominal tenderness.  Musculoskeletal:        General: No tenderness. Normal range of motion.     Cervical back: Normal range of motion and neck supple.     Comments: Upper / Lower Extremities: - Normal muscle tone, strength bilateral upper extremities 5/5, lower extremities 5/5  Except L Knee has ACE Wrap and crutches due to left knee injury recently  Lymphadenopathy:     Cervical: No cervical adenopathy.  Skin:    General: Skin is warm and dry.     Findings: No erythema or rash.  Neurological:     Mental Status: She is alert and oriented to person, place, and time.     Comments: Distal sensation intact to light touch all extremities  Psychiatric:        Mood and Affect: Mood normal.        Behavior: Behavior normal.        Thought Content: Thought content normal.     Comments: Well groomed, good eye contact, normal speech and thoughts     Results for orders placed or performed in visit on 02/10/21  TSH  Result Value Ref Range   TSH 0.61 mIU/L  T4, free  Result Value Ref Range   Free T4 1.1 0.8 - 1.8 ng/dL  HgB A1c  Result Value Ref Range   Hgb A1c MFr Bld 4.5 <5.7 % of total Hgb   Mean Plasma Glucose 82 mg/dL   eAG (mmol/L) 4.6 mmol/L  CBC with Differential  Result Value Ref Range   WBC 3.5 (L) 3.8 - 10.8 Thousand/uL   RBC 5.01 3.80 - 5.10 Million/uL   Hemoglobin 15.7 (H) 11.7 - 15.5 g/dL   HCT 46.5 (H) 35.0 - 45.0 %   MCV 92.8 80.0 - 100.0 fL   MCH 31.3 27.0 - 33.0 pg   MCHC 33.8 32.0  - 36.0 g/dL   RDW 12.2 11.0 - 15.0 %   Platelets 166 140 - 400 Thousand/uL   MPV 11.8 7.5 - 12.5 fL   Neutro Abs 1,824 1,500 - 7,800 cells/uL   Lymphs Abs 1,274 850 - 3,900 cells/uL   Absolute Monocytes 231 200 - 950 cells/uL   Eosinophils Absolute 140 15 - 500 cells/uL   Basophils Absolute 32 0 - 200 cells/uL   Neutrophils Relative % 52.1 %   Total Lymphocyte 36.4 %   Monocytes Relative 6.6 %   Eosinophils Relative 4.0 %   Basophils Relative 0.9 %  Comprehensive Metabolic Panel (CMET)  Result Value Ref Range   Glucose, Bld 79 65 - 99 mg/dL   BUN 11 7 - 25 mg/dL   Creat 0.62 0.50 - 1.03 mg/dL   BUN/Creatinine Ratio NOT APPLICABLE 6 - 22 (calc)   Sodium 141 135 - 146 mmol/L   Potassium 4.2 3.5 - 5.3 mmol/L   Chloride 109 98 - 110 mmol/L   CO2 25 20 - 32 mmol/L   Calcium 9.3 8.6 - 10.4 mg/dL   Total Protein 6.7 6.1 - 8.1 g/dL   Albumin 4.4 3.6 - 5.1 g/dL   Globulin 2.3 1.9 - 3.7 g/dL (calc)   AG Ratio 1.9 1.0 - 2.5 (calc)   Total Bilirubin 0.5 0.2 - 1.2 mg/dL   Alkaline phosphatase (APISO) 76 37 - 153 U/L   AST 15 10 - 35 U/L   ALT 10 6 - 29 U/L  Lipid panel  Result Value Ref Range   Cholesterol 153 <200 mg/dL   HDL 70 >  OR = 50 mg/dL   Triglycerides 101 <150 mg/dL   LDL Cholesterol (Calc) 65 mg/dL (calc)   Total CHOL/HDL Ratio 2.2 <5.0 (calc)   Non-HDL Cholesterol (Calc) 83 <130 mg/dL (calc)      Assessment & Plan:   Problem List Items Addressed This Visit     Essential hypertension    Controlled BP back on Amlodipine - Home readings improved - No complications  Plan: 1. Continue current dose Amlodipine 7.5mg  daily (5mg  tab, take 1.5 tab = 7.5mg ) daily for BP and migraine prevention 2. Continue monitor BP outside office      Relevant Medications   amLODipine (NORVASC) 5 MG tablet   Other Visit Diagnoses     Annual physical exam    -  Primary   Encounter for screening mammogram for malignant neoplasm of breast       Relevant Orders   MM 3D SCREEN BREAST  BILATERAL       Updated Health Maintenance information Reviewed recent lab results with patient Encouraged improvement to lifestyle with diet and exercise Goal of weight loss  Ordered mammogram 3D patient to schedule at [facility] This Encounter  Procedures   MM 3D SCREEN BREAST BILATERAL    Standing Status:   Future    Standing Expiration Date:   02/17/2022    Order Specific Question:   Reason for Exam (SYMPTOM  OR DIAGNOSIS REQUIRED)    Answer:   Screening bilateral 3D Mammogram [facility]    Order Specific Question:   Preferred imaging location?    Answer:   [facility] ordered this encounter  Medications   amLODipine (NORVASC) 5 MG tablet    Sig: Take 1.5 tablets (7.5 mg total) by mouth daily.    Dispense:  135 tablet    Refill:  3      Follow up plan: Return in about 1 year (around 02/17/2022) for 1 year fasting lab only then 1 week later Annual Physical.  [name], DO [facility] 02/17/2021, 2:46 PM

## 2021-02-17 NOTE — Patient Instructions (Addendum)
Thank you for coming to the office today.  If not improved with Knee contact me in 7-10 days.  For Mammogram screening for breast cancer   Call the [place] below anytime to schedule your own appointment now that order has been placed.  [facility] [place], [place] [address] Phone: [phone]  -------------------------------  [facility]  [facility] [address] [place], [place] [address] Hours: [place] Main: [place]   Address: [address] [place], [place], [place], [place] [address] Hours: 8AM-5PM Phone: [phone]  ---------  [facility]  OBGYN providers as well.  DUE for FASTING BLOOD WORK (no food or drink after midnight before the lab appointment, only water or coffee without cream/sugar on the morning of)  SCHEDULE "Lab Only" visit in the morning at the clinic for lab draw in 1 YEAR  - Make sure Lab Only appointment is at about 1 week before your next appointment, so that results will be available  For Lab Results, once available within 2-3 days of blood draw, you can can log in to MyChart online to view your results and a brief explanation. Also, we can discuss results at next follow-up visit.    Please schedule a Follow-up Appointment to: Return in about 1 year (around 02/17/2022) for 1 year fasting lab only then 1 week later Annual Physical.  If you have any other questions or concerns, please feel free to call the office or send a message through [place]. You may also schedule an earlier appointment if necessary.  Additionally, you may be receiving a survey about your experience at our office within a few days to 1 week by e-mail or mail. We value your feedback.  [name], DO [place]

## 2021-04-08 ENCOUNTER — Other Ambulatory Visit: Payer: Self-pay | Admitting: Internal Medicine

## 2021-04-08 NOTE — Telephone Encounter (Signed)
Component Ref Range & Units 1 yr ago  (02/20/20) 2 yr ago  (02/13/19) 3 yr ago  (02/18/18) 3 yr ago  (09/23/17) 5 yr ago  (02/21/16) 5 yr ago  (01/27/16) 5 yr ago  (11/15/15)  Vitamin B-12 200 - 1,100 pg/mL 254  295 CM  1,400 High   265 CM  1,384 High  R, CM

## 2021-04-11 ENCOUNTER — Encounter: Payer: Self-pay | Admitting: Internal Medicine

## 2021-06-06 ENCOUNTER — Encounter: Payer: Self-pay | Admitting: Family Medicine

## 2021-07-28 ENCOUNTER — Encounter: Payer: Self-pay | Admitting: Family Medicine

## 2021-07-28 ENCOUNTER — Ambulatory Visit (INDEPENDENT_AMBULATORY_CARE_PROVIDER_SITE_OTHER): Payer: Managed Care, Other (non HMO) | Admitting: Family Medicine

## 2021-07-28 VITALS — BP 130/88 | HR 72 | Ht 63.0 in | Wt 195.4 lb

## 2021-07-28 DIAGNOSIS — H6501 Acute serous otitis media, right ear: Secondary | ICD-10-CM

## 2021-07-28 DIAGNOSIS — J0110 Acute frontal sinusitis, unspecified: Secondary | ICD-10-CM

## 2021-07-28 DIAGNOSIS — J9801 Acute bronchospasm: Secondary | ICD-10-CM

## 2021-07-28 MED ORDER — AMOXICILLIN-POT CLAVULANATE 875-125 MG PO TABS: 1.0000 | ORAL_TABLET | Freq: Two times a day (BID) | ORAL | 0 refills | Status: DC

## 2021-07-28 MED ORDER — HYDROCOD POLI-CHLORPHE POLI ER 10-8 MG/5ML PO SUER: 5.0000 mL | Freq: Two times a day (BID) | ORAL | 0 refills | Status: DC | PRN

## 2021-07-28 MED ORDER — FLUTICASONE PROPIONATE 50 MCG/ACT NA SUSP: 2.0000 | Freq: Every day | NASAL | 3 refills | Status: AC

## 2021-07-28 NOTE — Progress Notes (Signed)
? ?Subjective:  ? ? Patient ID: Crystal Levine, female    DOB: [birth year], 56 y.o.   MRN: [id number] ? ?Crystal Levine is a 56 y.o. female presenting on 07/28/2021 for Sinus Problem and Ear Pain ? ? ?HPI ? ?Sinusitis / Right Ear Pain pressure ?Reports onset symptoms 1 week ago with sore throat and achy. Worsening now with R ear pain pressure sinus pressure drainage. Sore throat still present but has more sinus localized symptoms. ?Took NyQuil, Zyrtec ?Admits coughing with some congestion making noisy breathing but not sure if wheezing. Worse at night. ?Denies fever, body aches, headache, nausea vomiting abdominal pain ? ? ? ?  07/28/2021  ?  1:25 PM 02/17/2021  ?  2:57 PM 01/23/2020  ?  9:20 AM  ?Depression screen PHQ 2/9  ?Decreased Interest 0 0 0  ?Down, Depressed, Hopeless 0 0 0  ?PHQ - 2 Score 0 0 0  ?Altered sleeping 0    ?Tired, decreased energy 0    ?Change in appetite 0    ?Feeling bad or failure about yourself  0    ?Trouble concentrating 0    ?Moving slowly or fidgety/restless 0    ?Suicidal thoughts 0    ?PHQ-9 Score 0    ?Difficult doing work/chores Not difficult at all    ? ? ?Social History  ? ?Tobacco Use  ? Smoking status: Never  ? Smokeless tobacco: Never  ?Vaping Use  ? Vaping Use: Never used  ?Substance Use Topics  ? Alcohol use: No  ? Drug use: No  ? ? ?Review of Systems ?Per HPI unless specifically indicated above ? ?   ?Objective:  ?  ?BP 130/88   Pulse 72   Ht '5\' 3"'$  (1.6 m)   Wt 195 lb 6.4 oz (88.6 kg)   SpO2 98%   BMI 34.61 kg/m?   ?Wt Readings from Last 3 Encounters:  ?07/28/21 195 lb 6.4 oz (88.6 kg)  ?02/17/21 184 lb 12.8 oz (83.8 kg)  ?02/14/20 189 lb (85.7 kg)  ?  ?Physical Exam ?Vitals and nursing note reviewed.  ?Constitutional:   ?   General: She is not in acute distress. ?   Appearance: Normal appearance. She is well-developed. She is not diaphoretic.  ?   Comments: Well-appearing, comfortable, cooperative  ?HENT:  ?   Head: Normocephalic and atraumatic.  ?   Right Ear: Ear canal  and external ear normal. There is no impacted cerumen.  ?   Left Ear: Ear canal and external ear normal. There is no impacted cerumen.  ?   Ears:  ?   Comments: L TM with effusion clear ?R TM with erythema and bulging fullness. ?Eyes:  ?   General:     ?   Right eye: No discharge.     ?   Left eye: No discharge.  ?   Conjunctiva/sclera: Conjunctivae normal.  ?Neck:  ?   Thyroid: No thyromegaly.  ?Cardiovascular:  ?   Rate and Rhythm: Normal rate and regular rhythm.  ?   Heart sounds: Normal heart sounds. No murmur heard. ?Pulmonary:  ?   Effort: Pulmonary effort is normal. No respiratory distress.  ?   Breath sounds: Normal breath sounds. No wheezing or rales.  ?Musculoskeletal:     ?   General: Normal range of motion.  ?   Cervical back: Normal range of motion and neck supple.  ?Lymphadenopathy:  ?   Cervical: No cervical adenopathy.  ?Skin: ?   General: Skin  is warm and dry.  ?   Findings: No erythema or rash.  ?Neurological:  ?   Mental Status: She is alert and oriented to person, place, and time.  ?Psychiatric:     ?   Mood and Affect: Mood normal.     ?   Behavior: Behavior normal.     ?   Thought Content: Thought content normal.  ?   Comments: Well groomed, good eye contact, normal speech and thoughts  ? ? ? ?Results for orders placed or performed in visit on 02/10/21  ?TSH  ?Result Value Ref Range  ? TSH 0.61 mIU/L  ?T4, free  ?Result Value Ref Range  ? Free T4 1.1 0.8 - 1.8 ng/dL  ?HgB A1c  ?Result Value Ref Range  ? Hgb A1c MFr Bld 4.5 <5.7 % of total Hgb  ? Mean Plasma Glucose 82 mg/dL  ? eAG (mmol/L) 4.6 mmol/L  ?CBC with Differential  ?Result Value Ref Range  ? WBC 3.5 (L) 3.8 - 10.8 Thousand/uL  ? RBC 5.01 3.80 - 5.10 Million/uL  ? Hemoglobin 15.7 (H) 11.7 - 15.5 g/dL  ? HCT 46.5 (H) 35.0 - 45.0 %  ? MCV 92.8 80.0 - 100.0 fL  ? MCH 31.3 27.0 - 33.0 pg  ? MCHC 33.8 32.0 - 36.0 g/dL  ? RDW 12.2 11.0 - 15.0 %  ? Platelets 166 140 - 400 Thousand/uL  ? MPV 11.8 7.5 - 12.5 fL  ? Neutro Abs 1,824 1,500 - 7,800  cells/uL  ? Lymphs Abs 1,274 850 - 3,900 cells/uL  ? Absolute Monocytes 231 200 - 950 cells/uL  ? Eosinophils Absolute 140 15 - 500 cells/uL  ? Basophils Absolute 32 0 - 200 cells/uL  ? Neutrophils Relative % 52.1 %  ? Total Lymphocyte 36.4 %  ? Monocytes Relative 6.6 %  ? Eosinophils Relative 4.0 %  ? Basophils Relative 0.9 %  ?Comprehensive Metabolic Panel (CMET)  ?Result Value Ref Range  ? Glucose, Bld 79 65 - 99 mg/dL  ? BUN 11 7 - 25 mg/dL  ? Creat 0.62 0.50 - 1.03 mg/dL  ? BUN/Creatinine Ratio NOT APPLICABLE 6 - 22 (calc)  ? Sodium 141 135 - 146 mmol/L  ? Potassium 4.2 3.5 - 5.3 mmol/L  ? Chloride 109 98 - 110 mmol/L  ? CO2 25 20 - 32 mmol/L  ? Calcium 9.3 8.6 - 10.4 mg/dL  ? Total Protein 6.7 6.1 - 8.1 g/dL  ? Albumin 4.4 3.6 - 5.1 g/dL  ? Globulin 2.3 1.9 - 3.7 g/dL (calc)  ? AG Ratio 1.9 1.0 - 2.5 (calc)  ? Total Bilirubin 0.5 0.2 - 1.2 mg/dL  ? Alkaline phosphatase (APISO) 76 37 - 153 U/L  ? AST 15 10 - 35 U/L  ? ALT 10 6 - 29 U/L  ?Lipid panel  ?Result Value Ref Range  ? Cholesterol 153 <200 mg/dL  ? HDL 70 > OR = 50 mg/dL  ? Triglycerides 101 <150 mg/dL  ? LDL Cholesterol (Calc) 65 mg/dL (calc)  ? Total CHOL/HDL Ratio 2.2 <5.0 (calc)  ? Non-HDL Cholesterol (Calc) 83 <130 mg/dL (calc)  ? ?   ?Assessment & Plan:  ? ?Problem List Items Addressed This Visit   ?None ?Visit Diagnoses   ? ? Non-recurrent acute serous otitis media of right ear    -  Primary  ? Relevant Medications  ? amoxicillin-clavulanate (AUGMENTIN) 875-125 MG tablet  ? fluticasone (FLONASE) 50 MCG/ACT nasal spray  ? Acute non-recurrent frontal sinusitis      ?  Relevant Medications  ? amoxicillin-clavulanate (AUGMENTIN) 875-125 MG tablet  ? fluticasone (FLONASE) 50 MCG/ACT nasal spray  ? chlorpheniramine-HYDROcodone (TUSSIONEX PENNKINETIC ER) 10-8 MG/5ML  ? Bronchospasm      ? Relevant Medications  ? chlorpheniramine-HYDROcodone (TUSSIONEX PENNKINETIC ER) 10-8 MG/5ML  ? ?  ?  ?Consistent with acute frontal sinusitis, likely initially viral  URI vs allergic rhinitis component with worsening concern for bacterial infection now with second sickening, focal R Sided AOM on exam with erythema and bilateral eustachian tube dysfunction effusion. ? ?Plan: ?1. Start Augmentin 875-'125mg'$  PO BID x 10 days ?2. Start Flonase 2 sprays in each nostril daily for next 4-6 weeks, then may stop and use seasonally or as needed ?3. Keep anti histamine zyrtec ?4. Rx Tussionex PRN for night time cough bronchospasm ?Return criteria reviewed ? ? ?Meds ordered this encounter  ?Medications  ? amoxicillin-clavulanate (AUGMENTIN) 875-125 MG tablet  ?  Sig: Take 1 tablet by mouth 2 (two) times daily.  ?  Dispense:  20 tablet  ?  Refill:  0  ? fluticasone (FLONASE) 50 MCG/ACT nasal spray  ?  Sig: Place 2 sprays into both nostrils daily. Use for 4-6 weeks then stop and use seasonally or as needed.  ?  Dispense:  16 g  ?  Refill:  3  ? chlorpheniramine-HYDROcodone (TUSSIONEX PENNKINETIC ER) 10-8 MG/5ML  ?  Sig: Take 5 mLs by mouth every 12 (twelve) hours as needed for cough.  ?  Dispense:  140 mL  ?  Refill:  0  ? ? ? ? ?Follow up plan: ?Return if symptoms worsen or fail to improve. ? ? ?[name], DO ?[facility] ?[facility] ?07/28/2021, 1:30 PM ?

## 2021-07-28 NOTE — Patient Instructions (Addendum)
Thank you for coming to the office today. ? ?1. It sounds like you have a Sinusitis (Bacterial Infection) - this most likely started as an Upper Respiratory Virus that has settled into an infection. Allergies can also cause this. ?- Start Augmentin 1 pill twice daily (breakfast and dinner, with food and plenty of water) for 10 days, complete entire course, do not stop early even if feeling better ?- Start Flonase 2 sprays in each nostril daily for next 4-6 weeks, then you may stop and use seasonally or as needed ? -Start Tussionex cough syrup ?- Recommend to  keep using Nasal Saline spray multiple times a day to help flush out congestion and clear sinuses ?- Improve hydration by drinking plenty of clear fluids (water, gatorade) to reduce secretions and thin congestion ?- Congestion draining down throat can cause irritation. May try warm herbal tea with honey, cough drops ?- Can take Tylenol or Ibuprofen as needed for fevers ? ?OTC Sudafed (Behind the counter) ? ?- May continue over the counter cold medicine as you are, I would not use any decongestant or mucinex longer than 7 days. ? ?Please schedule a Follow-up Appointment to: Return if symptoms worsen or fail to improve. ? ?If you have any other questions or concerns, please feel free to call the office or send a message through [place]. You may also schedule an earlier appointment if necessary. ? ?Additionally, you may be receiving a survey about your experience at our office within a few days to 1 week by e-mail or mail. We value your feedback. ? ?[name], DO ?[facility] ?

## 2021-08-04 ENCOUNTER — Encounter: Payer: Self-pay | Admitting: Family Medicine

## 2021-08-04 DIAGNOSIS — H6501 Acute serous otitis media, right ear: Secondary | ICD-10-CM

## 2021-08-04 MED ORDER — PREDNISONE 10 MG PO TABS: ORAL_TABLET | ORAL | 0 refills | Status: DC

## 2021-08-13 ENCOUNTER — Encounter: Payer: Self-pay | Admitting: Family Medicine

## 2021-10-13 ENCOUNTER — Telehealth: Payer: Self-pay

## 2021-10-13 ENCOUNTER — Ambulatory Visit: Payer: Managed Care, Other (non HMO) | Attending: Obstetrics and Gynecology

## 2021-12-01 ENCOUNTER — Encounter: Payer: Self-pay | Admitting: Family Medicine

## 2021-12-15 ENCOUNTER — Ambulatory Visit: Payer: Managed Care, Other (non HMO) | Admitting: Family Medicine

## 2021-12-15 ENCOUNTER — Encounter: Payer: Self-pay | Admitting: Family Medicine

## 2021-12-15 VITALS — BP 141/86 | HR 71 | Ht 63.0 in | Wt 191.8 lb

## 2021-12-15 DIAGNOSIS — Q6672 Congenital pes cavus, left foot: Secondary | ICD-10-CM

## 2021-12-15 DIAGNOSIS — M7672 Peroneal tendinitis, left leg: Secondary | ICD-10-CM

## 2021-12-15 DIAGNOSIS — G8929 Other chronic pain: Secondary | ICD-10-CM | POA: Diagnosis not present

## 2021-12-15 DIAGNOSIS — M79672 Pain in left foot: Secondary | ICD-10-CM

## 2021-12-15 DIAGNOSIS — Q6671 Congenital pes cavus, right foot: Secondary | ICD-10-CM

## 2021-12-15 MED ORDER — NAPROXEN 500 MG PO TABS: 500.0000 mg | ORAL_TABLET | Freq: Two times a day (BID) | ORAL | 0 refills | Status: AC

## 2021-12-15 NOTE — Patient Instructions (Addendum)
Thank you for coming to the office today.  May be tendonitis vs plantar fasciitis  Consider the Tension Night Splint for foot stretching overnight This is more for the arch pain. But can still help tendonitis  [facility] [place], [place] [address] Hours - M-F 8-5 Phone: [phone] phone  - Often there is a bone spur or arthritis of the heel bone that causes this - Also it may be caused by abnormal footwear, walking pattern or other problems  If you are experiencing an acute flare with pain, this is usually worst first thing in the morning when the plantar fascia is tight and stiff. First step out of bed is painful usually, and it may gradually improve with stretching and walking.  Recommend trial of Anti-inflammatory with Naproxen (Naprosyn) '500mg'$  tabs - take one with food and plenty of water TWICE daily every day (breakfast and dinner), for next 2 to 4 weeks, then you may take only as needed - DO NOT TAKE any ibuprofen, aleve, motrin while you are taking this medicine - It is safe to take Tylenol Ext Str '500mg'$  tabs - take 1 to 2 (max dose '1000mg'$ ) every 6 hours as needed for breakthrough pain, max 24 hour daily dose is 6 to 8 tablets or '4000mg'$   Recommend: - Rest / relative rest with activity modification avoid overuse / prolonged stand - Ice packs (make sure you use a towel or sock / something to protect skin)  May also try topical muscle rub, icy hot, tiger balm  Start the exercises listed below, gradually increase them as instructed. May be sore at first and hopefully will stretch out and help reduce pain later.  If not improving after 1-2 weeks on medicine, and stretching exercises and some rest - then can contact our office again for re-evaluation may consider other causes or can refer you to Orthopedic specialist to have second opinion, and consider a steroid injection.     Please schedule a Follow-up Appointment to: Return if symptoms worsen  or fail to improve.  If you have any other questions or concerns, please feel free to call the office or send a message through [place]. You may also schedule an earlier appointment if necessary.  Additionally, you may be receiving a survey about your experience at our office within a few days to 1 week by e-mail or mail. We value your feedback.  [name], DO [facility], [facility]             Arch Pain Rehabilitation Exercises   Stretching: You may begin exercising the muscles of your foot right away by gently stretching them with the towel stretch. When the towel stretch becomes too easy, you may begin doing the standing calf stretch and plantar fascia stretch.  Towel stretch: Sit on a hard surface with your injured leg stretched out in front of you. Loop a towel around the ball of your foot and pull the towel toward your body keeping your knee straight. Hold this position for 15 to 30 seconds then relax. Repeat 3 times. Standing calf stretch: Facing a wall, put your hands against the wall at about eye level. Keep the injured leg back, the uninjured leg forward, and the heel of your injured leg on the floor. Turn your injured foot slightly inward (as if you were pigeon-toed) as you slowly lean into the wall until you feel a stretch in the back of your calf. Hold for 15 to 30  seconds. Repeat 3 times. Do this exercise several times each day. When you can stand comfortably on your injured foot, you can begin stretching the plantar fascia at the bottom of your foot.  Plantar fascia stretch: Stand with the ball of your injured foot on a stair. Reach for the bottom step with your heel until you feel a stretch in the arch of your foot. Hold this position for 15 to 30 seconds and then relax. Repeat 3 times. Static and dynamic balance exercises  Place a chair next to your non-injured leg and stand upright. (This will provide you with balance if needed.) Stand on your  injured foot. Try to raise the arch of your foot while keeping your toes on the floor. Try to maintain this position and balance on your injured side for 30 seconds. This exercise can be made more difficult by doing it on a piece of foam or a pillow, or with your eyes closed.  Stand in the same position as above. Keep your foot in this position and reach forward in front of you with your injured side's hand, allowing your knee to bend. Repeat this 10 times while maintaining the arch height. This exercise can be made more difficult by reaching farther in front of you. Do 2 sets.  Stand in the same position as above. While maintaining your arch height, reach the injured side's hand across your body toward the chair. The farther you reach, the more challenging the exercise. Do 2 sets of 10.  Towel pickup: With your heel on the ground, pick up a towel with your toes. Release. Repeat 10 to 20 times. When this gets easy, add more resistance by placing a book or small weight on the towel.  Frozen can roll: Roll your bare injured foot back and forth from your heel to your mid-arch over a frozen juice can. Repeat for 3 to 5 minutes. This exercise is particularly helpful if done first thing in the morning. Resisted dorsiflexion: Sit with your injured leg out straight and your foot facing a doorway. Tie a loop in one end of the tubing. Put your foot through the loop so that the tubing goes around the arch of your foot. Tie a knot in the other end of the tubing and shut the knot in the door. Move backward until there is tension in the tubing. Keeping your knee straight, pull your foot toward your body, stretching the tubing. Slowly return to the starting position. Do 3 sets of 10.  Next, you can begin strengthening the muscles of your foot and lower leg by doing the rest of the exercises.  Strengthening Resisted plantar flexion: Sit with your leg outstretched and loop the middle section of the tubing around the  ball of your foot. Hold the ends of the tubing in both hands. Gently press the ball of your foot down and point your toes, stretching the tubing. Return to the starting position. Do 3 sets of 10.  Resisted inversion: Sit with your legs out straight and cross your uninjured leg over your injured ankle. Wrap the tubing around the ball of your injured foot and then loop it around your uninjured foot so that the tubing is anchored there at one end. Hold the other end of the tubing in your hand. Turn your injured foot inward and upward. This will stretch the tubing. Return to the starting position. Do 3 sets of 10.  Resisted eversion: Sit with both legs stretched out in front of  you, with your feet about a shoulder's width apart. Tie a loop in one end of the tubing. Put your injured foot through the loop so that the tubing goes around the arch of that foot and wraps around the outside of the uninjured foot. Hold onto the other end of the tubing with your hand to provide tension. Turn your injured foot up and out. Make sure you keep your uninjured foot still so that it will allow the tubing to stretch as you move your injured foot. Return to the starting position. Do 3 sets of 10.

## 2021-12-15 NOTE — Progress Notes (Unsigned)
Subjective:    Patient ID: Crystal Levine, female    DOB: [birth year], 56 y.o.   MRN: [id number]  Crystal Levine is a 56 y.o. female presenting on 12/15/2021 for Foot Pain   HPI  Left Foot Pain History of prior tendon/ligament injury as child, required cast for few months in the past. She has not had any further recent injuries. Reports gradual worsening onset over past 2-3 months approximately, reports first steps when wake up and starts moving and weightbearing has severe pain Left foot, then if walking regularly it does improve some but if stop to rest and elevate foot, can have swelling and throbbing pain. She can have radiating pain from hindfoot radiating to forefoot. She also admits some back difficulty with pain due to walking with a limp. - Taking OTC Tylenol PRN, topical Voltaren      12/15/2021    2:31 PM 07/28/2021    1:25 PM 02/17/2021    2:57 PM  Depression screen PHQ 2/9  Decreased Interest 0 0 0  Down, Depressed, Hopeless 0 0 0  PHQ - 2 Score 0 0 0  Altered sleeping 2 0   Tired, decreased energy 0 0   Change in appetite 0 0   Feeling bad or failure about yourself  0 0   Trouble concentrating 0 0   Moving slowly or fidgety/restless 0 0   Suicidal thoughts 0 0   PHQ-9 Score 2 0   Difficult doing work/chores Not difficult at all Not difficult at all     Social History   Tobacco Use  . Smoking status: Never  . Smokeless tobacco: Never  Vaping Use  . Vaping Use: Never used  Substance Use Topics  . Alcohol use: No  . Drug use: No    Review of Systems Per HPI unless specifically indicated above     Objective:    BP (!) 141/86   Pulse 71   Ht '5\' 3"'$  (1.6 m)   Wt 191 lb 12.8 oz (87 kg)   SpO2 98%   BMI 33.98 kg/m   Wt Readings from Last 3 Encounters:  12/15/21 191 lb 12.8 oz (87 kg)  07/28/21 195 lb 6.4 oz (88.6 kg)  02/17/21 184 lb 12.8 oz (83.8 kg)    Physical Exam Vitals and nursing note reviewed.  Constitutional:      General: She is not  in acute distress.    Appearance: Normal appearance. She is well-developed. She is not diaphoretic.     Comments: Well-appearing, comfortable, cooperative  HENT:     Head: Normocephalic and atraumatic.  Eyes:     General:        Right eye: No discharge.        Left eye: No discharge.     Conjunctiva/sclera: Conjunctivae normal.  Cardiovascular:     Rate and Rhythm: Normal rate.  Pulmonary:     Effort: Pulmonary effort is normal.  Musculoskeletal:     Comments: Left foot pain localized to lateral ankle and achilles, not really involving plantar fascia or plantar aspect of foot. No deformity  Skin:    General: Skin is warm and dry.     Findings: No erythema or rash.  Neurological:     Mental Status: She is alert and oriented to person, place, and time.  Psychiatric:        Mood and Affect: Mood normal.        Behavior: Behavior normal.  Thought Content: Thought content normal.     Comments: Well groomed, good eye contact, normal speech and thoughts   Results for orders placed or performed in visit on 02/10/21  TSH  Result Value Ref Range   TSH 0.61 mIU/L  T4, free  Result Value Ref Range   Free T4 1.1 0.8 - 1.8 ng/dL  HgB A1c  Result Value Ref Range   Hgb A1c MFr Bld 4.5 <5.7 % of total Hgb   Mean Plasma Glucose 82 mg/dL   eAG (mmol/L) 4.6 mmol/L  CBC with Differential  Result Value Ref Range   WBC 3.5 (L) 3.8 - 10.8 Thousand/uL   RBC 5.01 3.80 - 5.10 Million/uL   Hemoglobin 15.7 (H) 11.7 - 15.5 g/dL   HCT 46.5 (H) 35.0 - 45.0 %   MCV 92.8 80.0 - 100.0 fL   MCH 31.3 27.0 - 33.0 pg   MCHC 33.8 32.0 - 36.0 g/dL   RDW 12.2 11.0 - 15.0 %   Platelets 166 140 - 400 Thousand/uL   MPV 11.8 7.5 - 12.5 fL   Neutro Abs 1,824 1,500 - 7,800 cells/uL   Lymphs Abs 1,274 850 - 3,900 cells/uL   Absolute Monocytes 231 200 - 950 cells/uL   Eosinophils Absolute 140 15 - 500 cells/uL   Basophils Absolute 32 0 - 200 cells/uL   Neutrophils Relative % 52.1 %   Total Lymphocyte  36.4 %   Monocytes Relative 6.6 %   Eosinophils Relative 4.0 %   Basophils Relative 0.9 %  Comprehensive Metabolic Panel (CMET)  Result Value Ref Range   Glucose, Bld 79 65 - 99 mg/dL   BUN 11 7 - 25 mg/dL   Creat 0.62 0.50 - 1.03 mg/dL   BUN/Creatinine Ratio NOT APPLICABLE 6 - 22 (calc)   Sodium 141 135 - 146 mmol/L   Potassium 4.2 3.5 - 5.3 mmol/L   Chloride 109 98 - 110 mmol/L   CO2 25 20 - 32 mmol/L   Calcium 9.3 8.6 - 10.4 mg/dL   Total Protein 6.7 6.1 - 8.1 g/dL   Albumin 4.4 3.6 - 5.1 g/dL   Globulin 2.3 1.9 - 3.7 g/dL (calc)   AG Ratio 1.9 1.0 - 2.5 (calc)   Total Bilirubin 0.5 0.2 - 1.2 mg/dL   Alkaline phosphatase (APISO) 76 37 - 153 U/L   AST 15 10 - 35 U/L   ALT 10 6 - 29 U/L  Lipid panel  Result Value Ref Range   Cholesterol 153 <200 mg/dL   HDL 70 > OR = 50 mg/dL   Triglycerides 101 <150 mg/dL   LDL Cholesterol (Calc) 65 mg/dL (calc)   Total CHOL/HDL Ratio 2.2 <5.0 (calc)   Non-HDL Cholesterol (Calc) 83 <130 mg/dL (calc)      Assessment & Plan:   Problem List Items Addressed This Visit   None Visit Diagnoses     Chronic foot pain, left    -  Primary   Relevant Medications   naproxen (NAPROSYN) 500 MG tablet   Other Relevant Orders   Ambulatory referral to Podiatry   Peroneal tendinitis of left lower extremity       Relevant Medications   naproxen (NAPROSYN) 500 MG tablet   Other Relevant Orders   Ambulatory referral to Podiatry   Pes cavus of both feet       Relevant Orders   Ambulatory referral to Podiatry        May be tendonitis vs plantar fasciitis  Consider the Tension  Night Splint for foot stretching overnight This is more for the arch pain. But can still help tendonitis - Often there is a bone spur or arthritis of the heel bone that causes this - Also it may be caused by abnormal footwear, walking pattern or other problems  Referral sent  [facility] [place], [place] [address] Hours - M-F  8-5 Phone: [phone] phone  Rx Naproxen course Topical NSAID PRN Tylenol PRN  Defer X-ray today, she will get X-ray and eval at Podiatry upcoming.  Orders Placed This Encounter  Procedures  . Ambulatory referral to Podiatry    Referral Priority:   Routine    Referral Type:   Consultation    Referral Reason:   Specialty Services Required    Requested Specialty:   Podiatry    Number of Visits Requested:   1      Meds ordered this encounter  Medications  . naproxen (NAPROSYN) 500 MG tablet    Sig: Take 1 tablet (500 mg total) by mouth 2 (two) times daily with a meal. For 1-2 weeks then as needed    Dispense:  60 tablet    Refill:  0      Follow up plan: Return if symptoms worsen or fail to improve.   [name], [facility] 12/15/2021, 2:51 PM

## 2021-12-27 ENCOUNTER — Other Ambulatory Visit: Payer: Self-pay | Admitting: Family Medicine

## 2021-12-27 DIAGNOSIS — G43109 Migraine with aura, not intractable, without status migrainosus: Secondary | ICD-10-CM

## 2021-12-30 NOTE — Telephone Encounter (Signed)
Requested Prescriptions  Pending Prescriptions Disp Refills  . SUMAtriptan (IMITREX) 50 MG tablet [Pharmacy Med Name: SUMATRIPTAN '50MG'$  TABLETS] 12 tablet 3    Sig: TAKE 1 TO 2 TABLETS BY MOUTH 1 TIME AS NEEDED FOR MIGRAINE. MAY REPEAT IN 2 HOURS IF HEADACHE PERSISTS     Neurology:  Migraine Therapy - Triptan Failed - 12/27/2021  9:57 AM      Failed - Last BP in normal range    BP Readings from Last 1 Encounters:  12/15/21 (!) 141/86         Passed - Valid encounter within last 12 months    Recent Outpatient Visits          2 weeks ago Chronic foot pain, left   [place], DO   5 months ago Non-recurrent acute serous otitis media of right ear   [facility] Olin Hauser, DO   10 months ago Annual physical exam   [facility] Olin Hauser, DO   1 year ago Annual physical exam   [facility] Olin Hauser, DO   1 year ago Dyspnea on exertion   [place], Devonne Doughty, DO

## 2022-02-04 ENCOUNTER — Other Ambulatory Visit: Payer: Self-pay | Admitting: Physician Assistant

## 2022-02-04 DIAGNOSIS — Z1231 Encounter for screening mammogram for malignant neoplasm of breast: Secondary | ICD-10-CM

## 2022-02-26 ENCOUNTER — Ambulatory Visit
Admission: RE | Admit: 2022-02-26 | Discharge: 2022-02-26 | Disposition: A | Discharge status: Home / Self Care | Payer: Managed Care, Other (non HMO) | Source: Ambulatory Visit | Attending: Physician Assistant | Admitting: Physician Assistant

## 2022-02-26 DIAGNOSIS — Z1231 Encounter for screening mammogram for malignant neoplasm of breast: Secondary | ICD-10-CM | POA: Insufficient documentation

## 2022-03-17 ENCOUNTER — Other Ambulatory Visit: Payer: Self-pay | Admitting: Family Medicine

## 2022-03-17 DIAGNOSIS — I10 Essential (primary) hypertension: Secondary | ICD-10-CM

## 2022-03-18 ENCOUNTER — Telehealth: Payer: Self-pay | Admitting: *Deleted

## 2022-03-18 NOTE — Telephone Encounter (Signed)
Left a voicemail to call back and schedule yearly physical.   Gave a 30 day courtesy refill of the Amlodipine.

## 2022-04-26 ENCOUNTER — Other Ambulatory Visit: Payer: Self-pay | Admitting: Family Medicine

## 2022-04-26 DIAGNOSIS — G43109 Migraine with aura, not intractable, without status migrainosus: Secondary | ICD-10-CM

## 2022-04-27 NOTE — Telephone Encounter (Signed)
Requested Prescriptions  Pending Prescriptions Disp Refills   SUMAtriptan (IMITREX) 50 MG tablet [Pharmacy Med Name: SUMATRIPTAN '50MG'$  TABLETS] 12 tablet 0    Sig: TAKE 1 TO 2 TABLETS BY MOUTH 1 TIME AS NEEDED FOR MIGRAINE. MAY REPEAT IN 2 HOURS IF HEADACHE PERSISTS     Neurology:  Migraine Therapy - Triptan Failed - 04/26/2022  1:04 AM      Failed - Last BP in normal range    BP Readings from Last 1 Encounters:  12/15/21 (!) 141/86         Passed - Valid encounter within last 12 months    Recent Outpatient Visits           4 months ago Chronic foot pain, left   [place], DO   9 months ago Non-recurrent acute serous otitis media of right ear   [place], DO   1 year ago Annual physical exam   [facility] Olin Hauser, DO   2 years ago Annual physical exam   [facility] [place], DO   2 years ago Dyspnea on exertion   [place], Devonne Doughty, DO

## 2022-05-05 ENCOUNTER — Other Ambulatory Visit: Payer: Self-pay | Admitting: Family Medicine

## 2022-05-05 DIAGNOSIS — I10 Essential (primary) hypertension: Secondary | ICD-10-CM

## 2022-05-06 NOTE — Telephone Encounter (Signed)
Requested medication (s) are due for refill today -yes  Requested medication (s) are on the active medication list -yes  Future visit scheduled -no  Last refill: 03/18/22 #60  Notes to clinic: courtesy RF given with last RF- fails visit protocol-sent for provider review   Requested Prescriptions  Pending Prescriptions Disp Refills   amLODipine (NORVASC) 5 MG tablet [Pharmacy Med Name: AMLODIPINE BESYLATE '5MG'$  TABLETS] 60 tablet 0    Sig: TAKE 1 AND 1/2 TABLETS(7.5 MG) BY MOUTH DAILY     Cardiovascular: Calcium Channel Blockers 2 Failed - 05/05/2022  7:26 PM      Failed - Last BP in normal range    BP Readings from Last 1 Encounters:  12/15/21 (!) 141/86         Passed - Last Heart Rate in normal range    Pulse Readings from Last 1 Encounters:  12/15/21 71         Passed - Valid encounter within last 6 months    Recent Outpatient Visits           4 months ago Chronic foot pain, left   [facility] [place], Devonne Doughty, DO   9 months ago Non-recurrent acute serous otitis media of right ear   [place], DO   1 year ago Annual physical exam   [facility] Olin Hauser, DO   2 years ago Annual physical exam   [place], DO   2 years ago Dyspnea on exertion   [place], DO                 Requested Prescriptions  Pending Prescriptions Disp Refills   amLODipine ([place]) 5 MG tablet [Pharmacy Med Name: AMLODIPINE BESYLATE '5MG'$  TABLETS] 60 tablet 0    Sig: TAKE 1 AND 1/2 TABLETS(7.5 MG) BY MOUTH DAILY     Cardiovascular: Calcium Channel Blockers 2 Failed - 05/05/2022  7:26 PM      Failed - Last BP in normal range    BP Readings from Last 1 Encounters:  12/15/21 (!) 141/86         Passed - Last Heart Rate in normal range    Pulse Readings from Last 1 Encounters:  12/15/21 71         Passed  - Valid encounter within last 6 months    Recent Outpatient Visits           4 months ago Chronic foot pain, left   [place], DO   9 months ago Non-recurrent acute serous otitis media of right ear   [place], DO   1 year ago Annual physical exam   [facility] Olin Hauser, DO   2 years ago Annual physical exam   [place], DO   2 years ago Dyspnea on exertion   [place], Devonne Doughty, DO

## 2022-05-20 ENCOUNTER — Encounter: Payer: Self-pay | Admitting: Family Medicine

## 2022-07-11 ENCOUNTER — Other Ambulatory Visit: Payer: Self-pay | Admitting: Family Medicine

## 2022-07-11 DIAGNOSIS — G43109 Migraine with aura, not intractable, without status migrainosus: Secondary | ICD-10-CM

## 2022-07-13 NOTE — Telephone Encounter (Signed)
Requested Prescriptions  Pending Prescriptions Disp Refills   SUMAtriptan (IMITREX) 50 MG tablet [Pharmacy Med Name: SUMATRIPTAN 50MG  TABLETS] 12 tablet 0    Sig: TAKE 1 TO 2 TABLETS BY MOUTH 1 TIME AS NEEDED FOR MIGRAINE. MAY REPEAT IN 2 HOURS IF HEADACHE PERSISTS     Neurology:  Migraine Therapy - Triptan Failed - 07/11/2022 12:32 PM      Failed - Last BP in normal range    BP Readings from Last 1 Encounters:  12/15/21 (!) 141/86         Passed - Valid encounter within last 12 months    Recent Outpatient Visits           7 months ago Chronic foot pain, left   [facility] Olin Hauser, DO   11 months ago Non-recurrent acute serous otitis media of right ear   [place], DO   1 year ago Annual physical exam   [facility] Olin Hauser, DO   2 years ago Annual physical exam   [facility] Olin Hauser, DO   2 years ago Dyspnea on exertion   [facility] [place], Devonne Doughty, [place]

## 2022-08-25 ENCOUNTER — Other Ambulatory Visit: Payer: Self-pay | Admitting: Family Medicine

## 2022-08-25 DIAGNOSIS — I10 Essential (primary) hypertension: Secondary | ICD-10-CM

## 2022-08-25 NOTE — Telephone Encounter (Signed)
Left a voicemail to call in and schedule her yearly physical at [facility].  I have  forwarded the Amlodipine 5 mg to Dr. Althea Charon for review for refills.

## 2023-02-03 ENCOUNTER — Other Ambulatory Visit: Payer: Self-pay | Admitting: Physician Assistant

## 2023-02-03 DIAGNOSIS — Z1231 Encounter for screening mammogram for malignant neoplasm of breast: Secondary | ICD-10-CM

## 2023-05-24 ENCOUNTER — Encounter: Payer: Self-pay | Admitting: Internal Medicine

## 2023-05-24 ENCOUNTER — Ambulatory Visit: Payer: Managed Care, Other (non HMO) | Admitting: Family Medicine

## 2023-05-24 ENCOUNTER — Ambulatory Visit: Payer: Managed Care, Other (non HMO) | Admitting: Internal Medicine

## 2023-05-24 VITALS — BP 132/84 | HR 78 | Temp 98.4°F | Ht 63.0 in | Wt 208.0 lb

## 2023-05-24 DIAGNOSIS — J101 Influenza due to other identified influenza virus with other respiratory manifestations: Secondary | ICD-10-CM

## 2023-05-24 LAB — POC COVID19/FLU A&B COMBO
Covid Antigen, POC: NEGATIVE
Influenza A Antigen, POC: POSITIVE — AB
Influenza B Antigen, POC: NEGATIVE

## 2023-05-24 MED ORDER — PREDNISONE 10 MG PO TABS: ORAL_TABLET | ORAL | 0 refills | Status: DC

## 2023-05-24 MED ORDER — ALBUTEROL SULFATE HFA 108 (90 BASE) MCG/ACT IN AERS: 2.0000 | INHALATION_SPRAY | Freq: Four times a day (QID) | RESPIRATORY_TRACT | 0 refills | Status: DC | PRN

## 2023-05-24 MED ORDER — GUAIFENESIN-CODEINE 100-10 MG/5ML PO SOLN: 5.0000 mL | ORAL | 0 refills | Status: DC | PRN

## 2023-05-24 NOTE — Progress Notes (Signed)
Subjective:    Patient ID: Crystal Levine, female    DOB: December 28, 1965, 58 y.o.   MRN: [id number]  HPI  Patient presents to clinic today with complaint of headache, runny nose, nasal congestion, ear pain, sore throat, cough and shortness of breath.  She reports this started 4 days ago.  The headache is located in her forehead.  She describes the pain as pressure.  She is blowing clear mucus out of her nose.  She denies difficulty swallowing.  The cough is mostly nonproductive.  She denies nausea, vomiting or diarrhea.  She is run a low-grade fever up to 100 F.  She denies chills but has had bodyaches.  She has tried Robitussin and DayQuil OTC with minimal relief of symptoms.  She has had sick contacts with similar symptoms.  Review of Systems   Past Medical History:  Diagnosis Date   Anemia    Anxiety    Depression    Hypertension    Migraine    In past    Current Outpatient Medications  Medication Sig Dispense Refill   amLODipine (NORVASC) 5 MG tablet TAKE 1 AND 1/2 TABLETS(7.5 MG) BY MOUTH DAILY 135 tablet 0   chlorpheniramine-HYDROcodone (TUSSIONEX PENNKINETIC ER) 10-8 MG/5ML Take 5 mLs by mouth every 12 (twelve) hours as needed for cough. 140 mL 0   cyanocobalamin (,VITAMIN B-12,) 1000 MCG/ML injection ADMINISTER 1 ML(1000 MCG) IN THE MUSCLE EVERY 30 DAYS 1 mL 11   diclofenac Sodium (VOLTAREN) 1 % GEL Apply 1 application topically 4 (four) times daily.     fluticasone (FLONASE) 50 MCG/ACT nasal spray Place 2 sprays into both nostrils daily. Use for 4-6 weeks then stop and use seasonally or as needed. 16 g 3   ketorolac (TORADOL) 10 MG tablet Take 10 mg by mouth every 6 (six) hours as needed.     naproxen (NAPROSYN) 500 MG tablet Take 1 tablet (500 mg total) by mouth 2 (two) times daily with a meal. For 1-2 weeks then as needed 60 tablet 0   SUMAtriptan (IMITREX) 50 MG tablet TAKE 1 TO 2 TABLETS BY MOUTH 1 TIME AS NEEDED FOR MIGRAINE. MAY REPEAT IN 2 HOURS IF HEADACHE PERSISTS 12  tablet 0   No current facility-administered medications for this visit.    No Known Allergies  Family History  Adopted: Yes  Family history unknown: Yes    Social History   Socioeconomic History   Marital status: Married    Spouse name: Not on file   Number of children: Not on file   Years of education: Not on file   Highest education level: Not on file  Occupational History   Not on file  Tobacco Use   Smoking status: Never   Smokeless tobacco: Never  Vaping Use   Vaping status: Never Used  Substance and Sexual Activity   Alcohol use: No   Drug use: No   Sexual activity: Yes    Comment: vasectomy  Other Topics Concern   Not on file  Social History Narrative   ** Merged History Encounter **       Social Drivers of Corporate investment banker Strain: Not on file  Food Insecurity: Not on file  Transportation Needs: Not on file  Physical Activity: Not on file  Stress: Not on file  Social Connections: Not on file  Intimate Partner Violence: Not on file     Constitutional: Pt reports headache and fever. Denies malaise, fatigue, or abrupt weight changes.  HEENT: Pt reports runny nose, nasal congestion, ear pain, and sore throat. Denies eye pain, eye redness, ringing in the ears, wax buildup, bloody nose. Respiratory: Pt reports cough and shortness of breath. Denies difficulty breathing, or sputum production.   Cardiovascular: Denies chest pain, chest tightness, palpitations or swelling in the hands or feet.  Gastrointestinal: Denies abdominal pain, bloating, constipation, diarrhea or blood in the stool.  GU: Denies urgency, frequency, pain with urination, burning sensation, blood in urine, odor or discharge. Musculoskeletal: Pt reports body aches. Denies decrease in range of motion, difficulty with gait, or joint swelling.  Skin: Denies redness, rashes, lesions or ulcercations.  Neurological: Denies dizziness, difficulty with memory, difficulty with speech or  problems with balance and coordination.    No other specific complaints in a complete review of systems (except as listed in HPI above).      Objective:   Physical Exam  BP 132/84 (BP Location: Left Arm, Patient Position: Sitting, Cuff Size: Normal)   Pulse 78   Temp 98.4 F (36.9 C)   Ht 5\' 3"  (1.6 m)   Wt 208 lb (94.3 kg)   SpO2 93%   BMI 36.85 kg/m   Wt Readings from Last 3 Encounters:  12/15/21 191 lb 12.8 oz (87 kg)  07/28/21 195 lb 6.4 oz (88.6 kg)  02/17/21 184 lb 12.8 oz (83.8 kg)    General: Appears her stated age, appears unwell, in NAD. Skin: Warm, dry and intact. No rashes noted. HEENT: Head: normal shape and size, frontal sinus tenderness noted; Eyes: sclera white, no icterus, conjunctiva pink, PERRLA and EOMs intact; Ears: Tm's gray and intact, normal light reflex; Nose: mucosa boggy and moist, septum midline; Throat/Mouth: Teeth present, mucosa pink and moist, + PND, no exudate, lesions or ulcerations noted.  Neck: No adenopathy noted. Cardiovascular: Normal rate and rhythm. S1,S2 noted.  No murmur, rubs or gallops noted.  Pulmonary/Chest: Normal effort and positive vesicular breath sounds. No respiratory distress. No wheezes, rales or ronchi noted.  Neurological: Alert and oriented.   BMET    Component Value Date/Time   NA 141 02/11/2021 0828   NA 140 07/10/2015 1030   K 4.2 02/11/2021 0828   CL 109 02/11/2021 0828   CO2 25 02/11/2021 0828   GLUCOSE 79 02/11/2021 0828   BUN 11 02/11/2021 0828   BUN 12 07/10/2015 1030   CREATININE 0.62 02/11/2021 0828   CALCIUM 9.3 02/11/2021 0828   GFRNONAA 108 02/20/2020 0808   GFRAA 125 02/20/2020 0808    Lipid Panel     Component Value Date/Time   CHOL 153 02/11/2021 0828   CHOL 135 07/10/2015 1030   TRIG 101 02/11/2021 0828   HDL 70 02/11/2021 0828   HDL 71 07/10/2015 1030   CHOLHDL 2.2 02/11/2021 0828   LDLCALC 65 02/11/2021 0828    CBC    Component Value Date/Time   WBC 3.5 (L) 02/11/2021 0828    RBC 5.01 02/11/2021 0828   HGB 15.7 (H) 02/11/2021 0828   HGB 11.9 07/10/2015 1030   HCT 46.5 (H) 02/11/2021 0828   HCT 36.6 07/10/2015 1030   PLT 166 02/11/2021 0828   PLT 214 07/10/2015 1030   MCV 92.8 02/11/2021 0828   MCV 85 07/10/2015 1030   MCH 31.3 02/11/2021 0828   MCHC 33.8 02/11/2021 0828   RDW 12.2 02/11/2021 0828   RDW 13.1 07/10/2015 1030   LYMPHSABS 1,274 02/11/2021 0828   LYMPHSABS 1.0 07/10/2015 1030   MONOABS 0.2 01/03/2020 0902  EOSABS 140 02/11/2021 0828   EOSABS 0.1 07/10/2015 1030   BASOSABS 32 02/11/2021 0828   BASOSABS 0.0 07/10/2015 1030    Hgb A1C Lab Results  Component Value Date   HGBA1C 4.5 02/11/2021            Assessment & Plan:   Influenza A:  Rapid flu, positive for a Rapid COVID-negative Encouraged rest and fluids Recommend alternating Tylenol and ibuprofen OTC as needed for fever and bodyaches She is outside the window for antiviral treatment Discussed that antibiotics are not indicated for influenza Rx for Pred taper x 7 days for symptom management Rx for albuterol 1 to 2 puffs every 4-6 hours as needed for shortness of breath Rx for Robitussin AC cough syrup-sedation caution  Follow-up with your PCP as previously scheduled Nicki Reaper, NP

## 2023-05-24 NOTE — Patient Instructions (Signed)

## 2023-05-27 ENCOUNTER — Encounter: Payer: Self-pay | Admitting: Family Medicine

## 2023-05-27 DIAGNOSIS — J101 Influenza due to other identified influenza virus with other respiratory manifestations: Secondary | ICD-10-CM

## 2023-05-27 DIAGNOSIS — R110 Nausea: Secondary | ICD-10-CM

## 2023-05-27 MED ORDER — ONDANSETRON 4 MG PO TBDP: 4.0000 mg | ORAL_TABLET | Freq: Three times a day (TID) | ORAL | 0 refills | Status: AC | PRN

## 2023-07-30 ENCOUNTER — Other Ambulatory Visit: Payer: Self-pay | Admitting: Family Medicine

## 2023-07-30 DIAGNOSIS — G43109 Migraine with aura, not intractable, without status migrainosus: Secondary | ICD-10-CM

## 2023-08-02 NOTE — Telephone Encounter (Signed)
 Requested Prescriptions  Pending Prescriptions Disp Refills   SUMAtriptan (IMITREX) 50 MG tablet [Pharmacy Med Name: SUMATRIPTAN 50MG  TABLETS] 12 tablet 0    Sig: TAKE 1 TO 2 TABLETS BY MOUTH 1 TIME AS NEEDED FOR MIGRAINE. MAY REPEAT IN 2 HOURS IF HEADACHE PERSISTS     Neurology:  Migraine Therapy - Triptan Passed - 08/02/2023 10:49 AM      Passed - Last BP in normal range    BP Readings from Last 1 Encounters:  05/24/23 132/84         Passed - Valid encounter within last 12 months    Recent Outpatient Visits           2 months ago Influenza A   [facility] [place], [place], [place]

## 2023-08-30 ENCOUNTER — Ambulatory Visit
Admission: EM | Admit: 2023-08-30 | Discharge: 2023-08-30 | Disposition: A | Discharge status: Home / Self Care | Attending: Emergency Medicine | Admitting: Emergency Medicine

## 2023-08-30 ENCOUNTER — Ambulatory Visit: Payer: Self-pay

## 2023-08-30 DIAGNOSIS — J069 Acute upper respiratory infection, unspecified: Secondary | ICD-10-CM

## 2023-08-30 MED ORDER — AZITHROMYCIN 250 MG PO TABS: 250.0000 mg | ORAL_TABLET | Freq: Every day | ORAL | 0 refills | Status: DC

## 2023-08-30 NOTE — ED Provider Notes (Signed)
 [name]    CSN: [id number] Arrival date & time: 08/30/23  0855      History   Chief Complaint Chief Complaint  Patient presents with   Cough   Wheezing   Fever    HPI Crystal Levine is a 58 y.o. female.  Patient presents with 5 day history of sinus pressure, congestion, cough, headache, fever.  Tmax 100.9.  No shortness of breath.  Treating symptoms with Dayquil, Nyquil; no OTC medication take today.    The history is provided by the patient and medical records.    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   Copper  deficiency 02/21/2016   Vitamin B12 deficiency due to intestinal malabsorption 01/29/2016   History of endometrial ablation 12/31/2015   B12 deficiency anemia 11/06/2015   Insomnia 09/04/2015   B12 deficiency 09/04/2015   Subclinical hyperthyroidism 09/04/2015   Vitamin D  deficiency 09/04/2015   Iron deficiency anemia 07/09/2015   Anxiety 07/09/2015   S/P gastric bypass 07/09/2015    Past Surgical History:  Procedure Laterality Date   CHOLECYSTECTOMY     COLONOSCOPY WITH PROPOFOL  N/A 05/27/2017   Procedure: COLONOSCOPY WITH PROPOFOL ;  Surgeon: Selena Daily, MD;  Location: [facility] END[facility]COPY;  Service: Gastroenterology;  Laterality: N/A;   DILATION AND CURETTAGE OF UTERUS  2003   2 Miscarriages   DILITATION & CURRETTAGE/HYSTROSCOPY WITH NOVASURE ABLATION N/A 12/16/2015   Procedure: DILATATION & CURETTAGE/HYSTEROSCOPY WITH NOVASURE ABLATION;   Surgeon: Colan Dash, MD;  Location: [facility] ORS;  Service: Gynecology;  Laterality: N/A;   GALLBLADDER SURGERY  2003   GASTRIC BYPASS  2004   GASTRIC BYPASS     HERNIA REPAIR  2005   HERNIA REPAIR      OB History     Gravida  5   Para  3   Term  3   Preterm  0   AB  2   Living  3      SAB  2   IAB  0   Ectopic  0   Multiple      Live Births  3            Home Medications    Prior to Admission medications   Medication Sig Start Date End Date Taking? Authorizing Provider  azithromycin  (ZITHROMAX ) 250 MG tablet Take 1 tablet (250 mg total) by mouth daily. Take first 2 tablets together, then 1 every day until finished. 08/30/23  Yes Wellington Half, NP  albuterol  (VENTOLIN  HFA) 108 (90 Base) MCG/ACT inhaler Inhale 2 puffs into the lungs every 6 (six) hours as needed for wheezing or shortness of breath. 05/24/23   Carollynn Cirri, NP  amLODipine  (NORVASC ) 5 MG tablet TAKE 1 AND 1/2 TABLETS(7.5 MG) BY MOUTH DAILY 08/25/22   Romeo Co, Kayleen Party, DO  chlorpheniramine-HYDROcodone  (TUSSIONEX PENNKINETIC ER) 10-8 MG/5ML Take 5 mLs by mouth every 12 (twelve) hours as needed for cough. Patient not taking:  Reported on 08/30/2023 07/28/21   Raina Bunting, DO  cyanocobalamin  (,VITAMIN B-12,) 1000 MCG/ML injection ADMINISTER 1 ML(1000 MCG) IN THE MUSCLE EVERY 30 DAYS 04/11/21   Allen, Lauren G, NP  diclofenac Sodium (VOLTAREN) 1 % GEL Apply 1 application topically 4 (four) times daily. 02/16/21   [provider]  fluticasone  (FLONASE ) 50 MCG/ACT nasal spray Place 2 sprays into both nostrils daily. Use for 4-6 weeks then stop and use seasonally or as needed. 07/28/21   Karamalegos, Kayleen Party, DO  guaiFENesin -codeine  100-10 MG/5ML syrup Take 5 mLs by mouth every 4 (four) hours as needed for cough. Patient not taking: Reported on 08/30/2023 05/24/23   Carollynn Cirri, NP  ketorolac  (TORADOL ) 10 MG tablet Take 10 mg by mouth every 6 (six) hours as needed.  02/16/21   [provider]  naproxen  (NAPROSYN ) 500 MG tablet Take 1 tablet (500 mg total) by mouth 2 (two) times daily with a meal. For 1-2 weeks then as needed 12/15/21   Raina Bunting, DO  ondansetron  (ZOFRAN -ODT) 4 MG disintegrating tablet Take 1 tablet (4 mg total) by mouth every 8 (eight) hours as needed for nausea or vomiting. 05/27/23   Karamalegos, Kayleen Party, DO  predniSONE  (DELTASONE ) 10 MG tablet Take 6 tabs on day 1, 5 tabs on day 2, 4 tabs on day 3, 3 tabs on day 4, 2 tabs on day 5, 1 tab on day 6 Patient not taking: Reported on 08/30/2023 05/24/23   Carollynn Cirri, NP  SUMAtriptan  (IMITREX ) 50 MG tablet TAKE 1 TO 2 TABLETS BY MOUTH 1 TIME AS NEEDED FOR MIGRAINE. MAY REPEAT IN 2 HOURS IF HEADACHE PERSISTS 08/02/23   Karamalegos, Kayleen Party, DO    Family History Family History  Adopted: Yes  Family history unknown: Yes    Social History Social History   Tobacco Use   Smoking status: Never   Smokeless tobacco: Never  Vaping Use   Vaping status: Never Used  Substance Use Topics   Alcohol use: No   Drug use: No     Allergies   Patient has no known allergies.   Review of Systems Review of Systems  Constitutional:  Positive for fever. Negative for chills.  HENT:  Positive for congestion and sinus pressure. Negative for ear pain and sore throat.   Respiratory:  Positive for cough. Negative for shortness of breath.   Neurological:  Positive for headaches.     Physical Exam Triage Vital Signs ED Triage Vitals [08/30/23 0901]  Encounter Vitals Group     BP 139/86     Systolic BP Percentile      Diastolic BP Percentile      Pulse Rate 73     Resp 18     Temp 97.7 F (36.5 C)     Temp src      SpO2 96 %     Weight      Height      Head Circumference      Peak Flow      Pain Score      Pain Loc      Pain Education      Exclude from Growth Chart    No data found.  Updated Vital Signs BP 139/86   Pulse 73   Temp 97.7 F (36.5 C)    Resp 18   SpO2 96%   Visual Acuity Right Eye Distance:   Left Eye Distance:   Bilateral Distance:    Right Eye  Near:   Left Eye Near:    Bilateral Near:     Physical Exam Constitutional:      General: She is not in acute distress. HENT:     Right Ear: Tympanic membrane normal.     Left Ear: Tympanic membrane normal.     Nose:     Comments: PND    Mouth/Throat:     Mouth: Mucous membranes are moist.     Pharynx: Oropharynx is clear.  Cardiovascular:     Rate and Rhythm: Normal rate and regular rhythm.     Heart sounds: Normal heart sounds.  Pulmonary:     Effort: Pulmonary effort is normal. No respiratory distress.     Breath sounds: Normal breath sounds. No wheezing.  Neurological:     Mental Status: She is alert.      UC Treatments / Results  Labs (all labs ordered are listed, but only abnormal results are displayed) Labs Reviewed - No data to display  EKG   Radiology No results found.  Procedures Procedures (including critical care time)  Medications Ordered in UC Medications - No data to display  Initial Impression / Assessment and Plan / UC Course  I have reviewed the triage vital signs and the nursing notes.  Pertinent labs & imaging results that were available during my care of the patient were reviewed by me and considered in my medical decision making (see chart for details).    Acute upper respiratory infection.  Afebrile and vital signs are stable.  Treating today with Zithromax  as patient reports this is worked well for her in the past.  Education provided on URI.  Instructed her to follow-up with her PCP if she is not improving.  She agrees to plan of care.  Final Clinical Impressions(s) / UC Diagnoses   Final diagnoses:  Acute upper respiratory infection     Discharge Instructions      Take the Zithromax  as directed.  Follow-up with your primary care provider if your symptoms are not improving.    ED Prescriptions     Medication  Sig Dispense Auth. Provider   azithromycin  (ZITHROMAX ) 250 MG tablet Take 1 tablet (250 mg total) by mouth daily. Take first 2 tablets together, then 1 every day until finished. 6 tablet Wellington Half, NP      PDMP not reviewed this encounter.   Wellington Half, NP 08/30/23 [phone]

## 2023-08-30 NOTE — Discharge Instructions (Addendum)
 Take the Zithromax as directed.  Follow up with your primary care provider if your symptoms are not improving.

## 2023-08-30 NOTE — ED Triage Notes (Signed)
 Patient to Urgent Care with complaints of fevers (100.9)/ nasal congestion/ cough/ sinus pain and pressure.  Symptoms started Wednesday. Symptoms worse at night.   Meds: ibuprofen / Excedrin migraine/ nyquil and dayquil.

## 2023-08-30 NOTE — Telephone Encounter (Addendum)
  Chief Complaint: sinus pain/congestion Symptoms: fever, cough, intermittent wheezing, right earache, headache, sore throat, hoarse voice Frequency: x 6 days Pertinent Negatives: Patient denies N/A. Disposition: [] ED /[x] Urgent Care (no appt availability in office) / [] Appointment(In office/virtual)/ []  [facility]/ [] Home Care/ [] Refused Recommended Disposition /[] [facility]/ []  Follow-up with PCP Additional Notes: Patient states she has been treating at home with Nyquil,Dayquil, ibuprofen  and Excedrin. No audible wheezing or labored breathing noted during triage, patient speaking in full sentences. Patient states her car isn't working so she has to carpool with her daughter and can not see PCP this afternoon. Patient states she will go to urgent care.  Copied from CRM [id number]. Topic: Clinical - Red Word Triage >> Aug 30, 2023  8:23 AM Crystal Levine wrote: Red Word that prompted transfer to Nurse Triage: - sick all weekend  Fever -tremendous headache  - coughing  - 100.9 Reason for Disposition  [1] Sinus pain (not just congestion) AND [2] fever  Answer Assessment - Initial Assessment Questions 1. LOCATION: "Where does it hurt?"      Behind eyes and in cheeks.  2. ONSET: "When did the sinus pain start?"  (e.g., hours, days)      08/25/23 Wednesday night and worsened over the weekend.  3. SEVERITY: "How bad is the pain?"   (Scale 1-10; mild, moderate or severe)   - MILD (1-3): doesn't interfere with normal activities    - MODERATE (4-7): interferes with normal activities (e.g., work or school) or awakens from sleep   - SEVERE (8-10): excruciating pain and patient unable to do any normal activities        7/10.  4. RECURRENT SYMPTOM: "Have you ever had sinus problems before?" If Yes, ask: "When was the last time?" and "What happened that time?"      Yes, about a year ago.  5. NASAL CONGESTION: "Is the nose blocked?" If Yes, ask: "Can you open it or must you breathe  through your mouth?"     Nasal congestion but states she is able to clear it.  6. NASAL DISCHARGE: "Do you have discharge from your nose?" If so ask, "What color?"     Yes, green.  7. FEVER: "Do you have a fever?" If Yes, ask: "What is it, how was it measured, and when did it start?"      Yes, 100.9 last night.  8. OTHER SYMPTOMS: "Do you have any other symptoms?" (e.g., sore throat, cough, earache, difficulty breathing)     Cough worse at night, wheezing intermittent, right ear ache, sore throat, hoarse voice.  9. PREGNANCY: "Is there any chance you are pregnant?" "When was your last menstrual period?"     N/A.  Protocols used: Sinus Pain or Congestion-A-AH

## 2023-09-01 ENCOUNTER — Encounter: Payer: Self-pay | Admitting: Family Medicine

## 2023-09-01 DIAGNOSIS — R062 Wheezing: Secondary | ICD-10-CM

## 2023-09-01 DIAGNOSIS — J9801 Acute bronchospasm: Secondary | ICD-10-CM

## 2023-09-01 DIAGNOSIS — J069 Acute upper respiratory infection, unspecified: Secondary | ICD-10-CM

## 2023-09-01 MED ORDER — PREDNISONE 10 MG PO TABS: ORAL_TABLET | ORAL | 0 refills | Status: AC

## 2023-09-01 MED ORDER — GUAIFENESIN-CODEINE 100-10 MG/5ML PO SOLN: 5.0000 mL | ORAL | 0 refills | Status: AC | PRN

## 2023-09-01 MED ORDER — ALBUTEROL SULFATE HFA 108 (90 BASE) MCG/ACT IN AERS: 2.0000 | INHALATION_SPRAY | Freq: Four times a day (QID) | RESPIRATORY_TRACT | 0 refills | Status: AC | PRN

## 2023-09-03 MED ORDER — AMOXICILLIN-POT CLAVULANATE 875-125 MG PO TABS: 1.0000 | ORAL_TABLET | Freq: Two times a day (BID) | ORAL | 0 refills | Status: DC

## 2023-09-03 NOTE — Addendum Note (Signed)
 Addended by: Raina Bunting on: 09/03/2023 09:54 AM   Modules accepted: Orders

## 2023-09-04 ENCOUNTER — Emergency Department

## 2023-09-04 ENCOUNTER — Emergency Department
Admission: EM | Admit: 2023-09-04 | Discharge: 2023-09-05 | Disposition: A | Discharge status: Home / Self Care | Attending: Emergency Medicine | Admitting: Emergency Medicine

## 2023-09-04 ENCOUNTER — Encounter: Payer: Self-pay | Admitting: Emergency Medicine

## 2023-09-04 ENCOUNTER — Other Ambulatory Visit: Payer: Self-pay

## 2023-09-04 DIAGNOSIS — R0602 Shortness of breath: Secondary | ICD-10-CM | POA: Diagnosis present

## 2023-09-04 DIAGNOSIS — J209 Acute bronchitis, unspecified: Secondary | ICD-10-CM | POA: Insufficient documentation

## 2023-09-04 DIAGNOSIS — I10 Essential (primary) hypertension: Secondary | ICD-10-CM | POA: Diagnosis not present

## 2023-09-04 LAB — BASIC METABOLIC PANEL WITH GFR
Anion gap: 10 (ref 5–15)
BUN: 17 mg/dL (ref 6–20)
CO2: 22 mmol/L (ref 22–32)
Calcium: 8.8 mg/dL — ABNORMAL LOW (ref 8.9–10.3)
Chloride: 108 mmol/L (ref 98–111)
Creatinine, Ser: 0.67 mg/dL (ref 0.44–1.00)
GFR, Estimated: >60 mL/min (ref >60)
Glucose, Bld: 113 mg/dL — ABNORMAL HIGH (ref 70–99)
Potassium: 3.4 mmol/L — ABNORMAL LOW (ref 3.5–5.1)
Sodium: 140 mmol/L (ref 135–145)

## 2023-09-04 LAB — CBC
HCT: 35.6 % — ABNORMAL LOW (ref 36.0–46.0)
Hemoglobin: 12.2 g/dL (ref 12.0–15.0)
MCH: 30.7 pg (ref 26.0–34.0)
MCHC: 34.3 g/dL (ref 30.0–36.0)
MCV: 89.4 fL (ref 80.0–100.0)
Platelets: 197 10*3/uL (ref 150–400)
RBC: 3.98 MIL/uL (ref 3.87–5.11)
RDW: 13.2 % (ref 11.5–15.5)
WBC: 6 10*3/uL (ref 4.0–10.5)
nRBC: 0 % (ref 0.0–0.2)

## 2023-09-04 MED ORDER — BENZONATATE 100 MG PO CAPS: 100.0000 mg | ORAL_CAPSULE | Freq: Three times a day (TID) | ORAL | 0 refills | Status: AC | PRN

## 2023-09-04 MED ORDER — IPRATROPIUM-ALBUTEROL 0.5-2.5 (3) MG/3ML IN SOLN
9.0000 mL | Freq: Once | RESPIRATORY_TRACT | Status: AC
  Administered 2023-09-04: 9 mL via RESPIRATORY_TRACT
  Filled 2023-09-04: qty 3

## 2023-09-04 MED ORDER — MORPHINE SULFATE (PF) 4 MG/ML IV SOLN
4.0000 mg | Freq: Once | INTRAVENOUS | Status: AC
  Administered 2023-09-04: 4 mg via INTRAVENOUS
  Filled 2023-09-04: qty 1

## 2023-09-04 MED ORDER — AEROCHAMBER PLUS FLO-VU MISC
1.0000 | Status: DC | PRN
  Filled 2023-09-04: qty 1

## 2023-09-04 MED ORDER — LIDOCAINE VISCOUS HCL 2 % MT SOLN: 20.0000 mL | OROMUCOSAL | 0 refills | Status: AC | PRN

## 2023-09-04 MED ORDER — IOHEXOL 350 MG/ML SOLN
100.0000 mL | Freq: Once | INTRAVENOUS | Status: AC | PRN
  Administered 2023-09-04: 100 mL via INTRAVENOUS

## 2023-09-04 MED ORDER — BENZONATATE 100 MG PO CAPS
200.0000 mg | ORAL_CAPSULE | Freq: Once | ORAL | Status: AC
  Administered 2023-09-04: 200 mg via ORAL
  Filled 2023-09-04: qty 2

## 2023-09-04 MED ORDER — KETOROLAC TROMETHAMINE 15 MG/ML IJ SOLN
15.0000 mg | Freq: Once | INTRAMUSCULAR | Status: AC
  Administered 2023-09-04: 15 mg via INTRAVENOUS
  Filled 2023-09-04: qty 1

## 2023-09-04 MED ORDER — LIDOCAINE VISCOUS HCL 2 % MT SOLN
15.0000 mL | Freq: Once | OROMUCOSAL | Status: AC
  Administered 2023-09-04: 15 mL via OROMUCOSAL
  Filled 2023-09-04: qty 15

## 2023-09-04 NOTE — ED Provider Notes (Signed)
 [facility] Provider Note    Event Date/Time   First MD Initiated Contact with Patient 09/04/23 2028     (approximate)   History   Chief Complaint: Shortness of Breath and Cough   HPI  Crystal Levine is a 58 y.o. female with past history of hypertension, anxiety who comes to the ED due to shortness of breath, productive cough for the past 2 weeks.  She tried a course of azithromycin  without relief.  Her doctor then started her on a course of steroids with no improvement.  2 days ago she started Augmentin .  She still having productive cough.  No fever or chills.  No overt chest pain, but does hurt to take a deep breath or cough forcefully.        Past Medical History:  Diagnosis Date   Anemia    Anxiety    Depression    Hypertension    Migraine    In past    Current Outpatient Rx   Order #: [id number] Class: Normal   Order #: [id number] Class: Normal   Order #: [id number] Class: Normal   Order #: [id number] Class: Normal   Order #: [id number] Class: Normal   Order #: [id number] Class: Normal   Order #: [id number] Class: Normal   Order #: [id number] Class: Historical Med   Order #: [id number] Class: Normal   Order #: [id number] Class: Normal   Order #: [id number] Class: Historical Med   Order #: [id number] Class: Normal   Order #: [id number] Class: Normal   Order #: [id number] Class: Normal   Order #: [id number] Class: Normal    Past Surgical History:  Procedure Laterality Date   CHOLECYSTECTOMY     COLONOSCOPY WITH PROPOFOL  N/A 05/27/2017   Procedure: COLONOSCOPY WITH PROPOFOL ;  Surgeon: Selena Daily, MD;  Location: [facility] ENDOSCOPY;  Service: Gastroenterology;  Laterality: N/A;   DILATION AND CURETTAGE OF UTERUS  2003   2 Miscarriages   DILITATION & CURRETTAGE/HYSTROSCOPY WITH NOVASURE ABLATION N/A 12/16/2015   Procedure: DILATATION & CURETTAGE/HYSTEROSCOPY WITH NOVASURE ABLATION;  Surgeon: Colan Dash, MD;  Location: [facility] ORS;  Service: Gynecology;   Laterality: N/A;   GALLBLADDER SURGERY  2003   GASTRIC BYPASS  2004   GASTRIC BYPASS     HERNIA REPAIR  2005   HERNIA REPAIR      Physical Exam   Triage Vital Signs: ED Triage Vitals [09/04/23 2011]  Encounter Vitals Group     BP (!) 152/85     Systolic BP Percentile      Diastolic BP Percentile      Pulse Rate 82     Resp (!) 24     Temp 98.1 F (36.7 C)     Temp Source Oral     SpO2 100 %     Weight 190 lb (86.2 kg)     Height      Head Circumference      Peak Flow      Pain Score 8     Pain Loc      Pain Education      Exclude from Growth Chart     Most recent vital signs: Vitals:   09/04/23 2011  BP: (!) 152/85  Pulse: 82  Resp: (!) 24  Temp: 98.1 F (36.7 C)  SpO2: 100%    General: Awake, no distress.  CV:  Good peripheral perfusion.  Regular rate and rhythm Resp:  Normal effort.  Mild tachypnea.  Good air entry in all lung fields.  Mild expiratory wheezing Abd:  No distention.  Soft nontender Other:  No lower extremity edema   ED Results / Procedures / Treatments   Labs (all labs ordered are listed, but only abnormal results are displayed) Labs Reviewed  BASIC METABOLIC PANEL WITH GFR - Abnormal; Notable for the following components:      Result Value   Potassium 3.4 (*)    Glucose, Bld 113 (*)    Calcium 8.8 (*)    All other components within normal limits  CBC - Abnormal; Notable for the following components:   HCT 35.6 (*)    All other components within normal limits  POC URINE PREG, ED     EKG Interpreted by me Normal sinus rhythm rate of 77.  Normal axis and intervals.  Normal QRS ST segments and T waves.   RADIOLOGY Chest x-ray interpreted by me, no infiltrate.  Radiology report reviewed  CT angiogram chest negative for PE   PROCEDURES:  Procedures   MEDICATIONS ORDERED IN ED: Medications  Aerochamber Plus device 1 each (has no administration in time range)  lidocaine  (XYLOCAINE ) 2 % viscous mouth solution 15 mL (has  no administration in time range)  benzonatate  (TESSALON ) capsule 200 mg (has no administration in time range)  ipratropium-albuterol  (DUONEB) 0.5-2.5 (3) MG/3ML nebulizer solution 9 mL (9 mLs Nebulization Given 09/04/23 2051)  ketorolac  (TORADOL ) 15 MG/ML injection 15 mg (15 mg Intravenous Given 09/04/23 2140)  morphine  (PF) 4 MG/ML injection 4 mg (4 mg Intravenous Given 09/04/23 2139)  iohexol (OMNIPAQUE) 350 MG/ML injection 100 mL (100 mLs Intravenous Contrast Given 09/04/23 2147)     IMPRESSION / MDM / ASSESSMENT AND PLAN / ED COURSE  I reviewed the triage vital signs and the nursing notes.  DDx: Acute bronchitis, pneumonia, pneumothorax, pulmonary embolism  Patient's presentation is most consistent with acute presentation with potential threat to life or bodily function.  Patient presents with shortness of breath, cough, mild wheezing.  Most likely acute bronchitis, but with her persistent symptoms not responsive to antibiotics and steroids so far, will obtain chest x-ray and give DuoNebs here.  If x-ray is negative, will proceed with CT angiogram to rule out PE.  Doubt ACS, dissection, pericarditis.  Patient does note she was prescribed an albuterol  inhaler but feels unsure how to use it effectively.   ----------------------------------------- 11:39 PM on 09/04/2023 ----------------------------------------- Feels better after nebs.  CT negative.  Patient can follow-up with PCP about finding of goiter.  Will provide an AeroChamber from pharmacy to help her albuterol  use at home      FINAL CLINICAL IMPRESSION(S) / ED DIAGNOSES   Final diagnoses:  Acute bronchitis, unspecified organism     Rx / DC Orders   ED Discharge Orders          Ordered    lidocaine  (XYLOCAINE ) 2 % solution  Every 2 hours PRN        09/04/23 2329    benzonatate  (TESSALON  PERLES) 100 MG capsule  3 times daily PRN        09/04/23 2329             Note:  This document was prepared using Dragon  voice recognition software and may include unintentional dictation errors.   Jacquie Maudlin, MD 09/04/23 2340

## 2023-09-04 NOTE — Discharge Instructions (Signed)
 Your lab tests and CT scan are reassuring.  The CT scan does show an enlarged thyroid  gland, and you should follow up with your primary care doctor for further evaluation of this finding.

## 2023-09-04 NOTE — ED Triage Notes (Signed)
 Pt in with persistent cough, congestion x 2 wks, reporting sob as well. Pt states she has finished 2 rounds of abx and is on steroids now, but symptoms have not improved.

## 2023-09-05 NOTE — ED Notes (Signed)
 E-signature not working at this time. Pt verbalized understanding of D/C instructions, prescriptions and follow up care with no further questions at this time. Pt in NAD and ambulatory at time of D/C. Patient given aerochamber device at time of discharge.

## 2023-09-15 MED ORDER — BUDESONIDE-FORMOTEROL FUMARATE 160-4.5 MCG/ACT IN AERO: 2.0000 | INHALATION_SPRAY | Freq: Two times a day (BID) | RESPIRATORY_TRACT | 2 refills | Status: AC

## 2023-09-15 NOTE — Addendum Note (Signed)
 Addended by: Raina Bunting on: 09/15/2023 07:16 PM   Modules accepted: Orders

## 2023-09-16 NOTE — Addendum Note (Signed)
 Addended by: Raina Bunting on: 09/16/2023 01:35 PM   Modules accepted: Orders

## 2023-10-06 ENCOUNTER — Ambulatory Visit
Admission: EM | Admit: 2023-10-06 | Discharge: 2023-10-06 | Disposition: A | Discharge status: Home / Self Care | Attending: Emergency Medicine | Admitting: Emergency Medicine

## 2023-10-06 ENCOUNTER — Encounter: Payer: Self-pay | Admitting: Emergency Medicine

## 2023-10-06 DIAGNOSIS — R319 Hematuria, unspecified: Secondary | ICD-10-CM | POA: Diagnosis present

## 2023-10-06 DIAGNOSIS — N390 Urinary tract infection, site not specified: Secondary | ICD-10-CM | POA: Insufficient documentation

## 2023-10-06 LAB — POCT URINALYSIS DIP (MANUAL ENTRY)
Bilirubin, UA: NEGATIVE
Glucose, UA: 100 mg/dL — AB
Ketones, POC UA: NEGATIVE mg/dL
Nitrite, UA: POSITIVE — AB
Protein Ur, POC: 30 mg/dL — AB
Spec Grav, UA: <=1.005 — AB (ref 1.010–1.025)
Urobilinogen, UA: 1 U/dL
pH, UA: 5 (ref 5.0–8.0)

## 2023-10-06 MED ORDER — CEPHALEXIN 500 MG PO CAPS: 500.0000 mg | ORAL_CAPSULE | Freq: Three times a day (TID) | ORAL | 0 refills | Status: AC

## 2023-10-06 NOTE — ED Triage Notes (Signed)
 Patient complains if urinary frequency, painful and blood in urine that started Sunday. Patient took OTC medications for symptoms Equate urinary pain relief.  Reports it did not help with symptoms. Pain 6/10 burning sensation. Aaron Aas

## 2023-10-06 NOTE — ED Provider Notes (Signed)
 [name]    CSN: [id number] Arrival date & time: 10/06/23  1027      History   Chief Complaint Chief Complaint  Patient presents with   Dysuria   Urinary Frequency   Hematuria    HPI Crystal Levine is a 58 y.o. female.  Patient presents with dysuria, urinary frequency, hematuria x 3 days.  No fever, abdominal pain, flank pain.  She has been treating her symptoms with OTC urinary discomfort medication.   The history is provided by the patient and medical records.    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   Copper  deficiency 02/21/2016   Vitamin B12 deficiency due to intestinal malabsorption 01/29/2016   History of endometrial ablation 12/31/2015   B12 deficiency anemia 11/06/2015   Insomnia 09/04/2015   B12 deficiency 09/04/2015   Subclinical hyperthyroidism 09/04/2015   Vitamin D  deficiency 09/04/2015   Iron deficiency anemia 07/09/2015   Anxiety 07/09/2015   S/P gastric bypass 07/09/2015    Past Surgical History:  Procedure Laterality Date   CHOLECYSTECTOMY     COLONOSCOPY WITH PROPOFOL  N/A 05/27/2017   Procedure: COLONOSCOPY WITH PROPOFOL ;  Surgeon: Selena Daily, MD;  Location: [facility] ENDOSCOPY;  Service: Gastroenterology;  Laterality: N/A;   DILATION AND CURETTAGE OF UTERUS  2003   2 Miscarriages   DILITATION & CURRETTAGE/HYSTROSCOPY WITH NOVASURE ABLATION N/A 12/16/2015   Procedure: DILATATION & CURETTAGE/HYSTEROSCOPY WITH NOVASURE ABLATION;   Surgeon: Colan Dash, MD;  Location: [facility] ORS;  Service: Gynecology;  Laterality: N/A;   GALLBLADDER SURGERY  2003   GASTRIC BYPASS  2004   GASTRIC BYPASS     HERNIA REPAIR  2005   HERNIA REPAIR      OB History     Gravida  5   Para  3   Term  3   Preterm  0   AB  2   Living  3      SAB  2   IAB  0   Ectopic  0   Multiple      Live Births  3            Home Medications    Prior to Admission medications   Medication Sig Start Date End Date Taking? Authorizing Provider  cephALEXin (KEFLEX) 500 MG capsule Take 1 capsule (500 mg total) by mouth 3 (three) times daily for 5 days. 10/06/23 10/11/23 Yes Wellington Half, NP  albuterol  (VENTOLIN  HFA) 108 (90 Base) MCG/ACT inhaler Inhale 2 puffs into the lungs every 6 (six) hours as needed for wheezing or shortness of breath. 09/01/23   Karamalegos, Kayleen Party, DO  amLODipine  (NORVASC ) 5 MG tablet TAKE 1 AND 1/2 TABLETS(7.5 MG) BY MOUTH DAILY 08/25/22   Karamalegos, Kayleen Party, DO  benzonatate  (TESSALON  PERLES) 100 MG capsule Take 1 capsule (100 mg total) by mouth 3 (three) times daily as needed for cough. Patient not taking: Reported on 10/06/2023 09/04/23 09/03/24  Jacquie Maudlin, MD  budesonide -formoterol  (SYMBICORT ) 160-4.5 MCG/ACT inhaler Inhale 2 puffs into the lungs 2 (two) times daily. 09/15/23   Karamalegos, Kayleen Party, DO  cyanocobalamin  (,VITAMIN B-12,) 1000 MCG/ML injection ADMINISTER 1 ML(1000 MCG) IN THE MUSCLE EVERY 30 DAYS 04/11/21   Allen, Lauren G, NP  diclofenac Sodium (VOLTAREN) 1 % GEL Apply 1 application topically 4 (four) times daily. 02/16/21   [provider]  fluticasone  (FLONASE ) 50 MCG/ACT nasal spray Place 2 sprays into both nostrils daily. Use for 4-6 weeks then stop and use seasonally or as needed. 07/28/21   Karamalegos, Kayleen Party, DO  guaiFENesin -codeine  100-10 MG/5ML syrup Take 5 mLs by mouth every 4 (four) hours as needed for cough. Patient not taking: Reported on 10/06/2023  09/01/23   Raina Bunting, DO  ketorolac  (TORADOL ) 10 MG tablet Take 10 mg by mouth every 6 (six) hours as needed. 02/16/21   [provider]  lidocaine  (XYLOCAINE ) 2 % solution Use as directed 20 mLs in the mouth or throat every 2 (two) hours as needed for mouth pain. Gargle and spit out 09/04/23   Jacquie Maudlin, MD  naproxen  (NAPROSYN ) 500 MG tablet Take 1 tablet (500 mg total) by mouth 2 (two) times daily with a meal. For 1-2 weeks then as needed 12/15/21   Raina Bunting, DO  ondansetron  (ZOFRAN -ODT) 4 MG disintegrating tablet Take 1 tablet (4 mg total) by mouth every 8 (eight) hours as needed for nausea or vomiting. 05/27/23   Karamalegos, Kayleen Party, DO  predniSONE  (DELTASONE ) 10 MG tablet Take 6 tabs with breakfast Day 1, 5 tabs Day 2, 4 tabs Day 3, 3 tabs Day 4, 2 tabs Day 5, 1 tab Day 6. 09/01/23   Karamalegos, Kayleen Party, DO  SUMAtriptan  (IMITREX ) 50 MG tablet TAKE 1 TO 2 TABLETS BY MOUTH 1 TIME AS NEEDED FOR MIGRAINE. MAY REPEAT IN 2 HOURS IF HEADACHE PERSISTS 08/02/23   Karamalegos, Kayleen Party, DO    Family History Family History  Adopted: Yes  Family history unknown: Yes    Social History Social History   Tobacco Use   Smoking status: Never   Smokeless tobacco: Never  Vaping Use   Vaping status: Never Used  Substance Use Topics   Alcohol use: No   Drug use: No     Allergies   Patient has no known allergies.   Review of Systems Review of Systems  Constitutional:  Negative for chills and fever.  Gastrointestinal:  Negative for abdominal pain, constipation, diarrhea, nausea and vomiting.  Genitourinary:  Positive for dysuria, frequency and hematuria. Negative for flank pain.     Physical Exam Triage Vital Signs ED Triage Vitals  Encounter Vitals Group     BP      Girls Systolic BP Percentile      Girls Diastolic BP Percentile      Boys Systolic BP Percentile      Boys Diastolic BP Percentile      Pulse      Resp      Temp       Temp src      SpO2      Weight      Height      Head Circumference      Peak Flow      Pain Score      Pain Loc      Pain Education      Exclude from Growth Chart    No data found.  Updated Vital  Signs BP 138/74 (BP Location: Left Arm)   Pulse 69   Temp 97.9 F (36.6 C) (Oral)   Resp 18   SpO2 98%   Visual Acuity Right Eye Distance:   Left Eye Distance:   Bilateral Distance:    Right Eye Near:   Left Eye Near:    Bilateral Near:     Physical Exam Constitutional:      General: She is not in acute distress. HENT:     Mouth/Throat:     Mouth: Mucous membranes are moist.   Cardiovascular:     Rate and Rhythm: Normal rate and regular rhythm.     Heart sounds: Normal heart sounds.  Pulmonary:     Effort: Pulmonary effort is normal. No respiratory distress.     Breath sounds: Normal breath sounds.  Abdominal:     Palpations: Abdomen is soft.     Tenderness: There is no abdominal tenderness. There is no right CVA tenderness, left CVA tenderness, guarding or rebound.   Neurological:     Mental Status: She is alert.      UC Treatments / Results  Labs (all labs ordered are listed, but only abnormal results are displayed) Labs Reviewed  POCT URINALYSIS DIP (MANUAL ENTRY) - Abnormal; Notable for the following components:      Result Value   Color, UA orange (*)    Glucose, UA =100 (*)    Spec Grav, UA <=1.005 (*)    Blood, UA large (*)    Protein Ur, POC =30 (*)    Nitrite, UA Positive (*)    Leukocytes, UA Large (3+) (*)    All other components within normal limits  URINE CULTURE    EKG   Radiology No results found.  Procedures Procedures (including critical care time)  Medications Ordered in UC Medications - No data to display  Initial Impression / Assessment and Plan / UC Course  I have reviewed the triage vital signs and the nursing notes.  Pertinent labs & imaging results that were available during my care of the patient were reviewed by  me and considered in my medical decision making (see chart for details).    UTI with hematuria.  Treating with Keflex. Urine culture pending. Discussed with patient that we will call her if the urine culture shows the need to change or discontinue the antibiotic. Instructed her to follow-up with her PCP if her symptoms are not improving. Patient agrees to plan of care.     Final Clinical Impressions(s) / UC Diagnoses   Final diagnoses:  Urinary tract infection with hematuria, site unspecified     Discharge Instructions      Take the antibiotic as directed.  The urine culture is pending.  We will call you if it shows the need to change or discontinue your antibiotic.    Follow-up with your primary care provider if your symptoms are not improving.      ED Prescriptions     Medication Sig Dispense Auth. Provider   cephALEXin (KEFLEX) 500 MG capsule Take 1 capsule (500 mg total) by mouth 3 (three) times daily for 5 days. 15 capsule Wellington Half, NP      PDMP not reviewed this encounter.   Wellington Half, NP 10/06/23 [phone]

## 2023-10-06 NOTE — Discharge Instructions (Addendum)
 Take the antibiotic as directed.  The urine culture is pending.  We will call you if it shows the need to change or discontinue your antibiotic.    Follow up with your primary care provider if your symptoms are not improving.

## 2023-10-08 ENCOUNTER — Ambulatory Visit (HOSPITAL_COMMUNITY): Payer: Self-pay

## 2023-10-08 LAB — URINE CULTURE: Culture: 40000 — AB

## 2023-10-20 ENCOUNTER — Other Ambulatory Visit: Payer: Self-pay | Admitting: Family Medicine

## 2023-10-20 DIAGNOSIS — I10 Essential (primary) hypertension: Secondary | ICD-10-CM

## 2023-10-20 DIAGNOSIS — G43109 Migraine with aura, not intractable, without status migrainosus: Secondary | ICD-10-CM

## 2023-10-25 NOTE — Telephone Encounter (Signed)
 Requested Prescriptions  Pending Prescriptions Disp Refills   amLODipine  (NORVASC ) 5 MG tablet [Pharmacy Med Name: AMLODIPINE  BESYLATE 5MG  TABLETS] 135 tablet 0    Sig: TAKE 1 AND 1/2 TABLETS(7.5 MG) BY MOUTH DAILY     Cardiovascular: Calcium Channel Blockers 2 Passed - 10/25/2023  9:17 AM      Passed - Last BP in normal range    BP Readings from Last 1 Encounters:  10/06/23 138/74         Passed - Last Heart Rate in normal range    Pulse Readings from Last 1 Encounters:  10/06/23 69         Passed - Valid encounter within last 6 months    Recent Outpatient Visits           5 months ago Influenza A   [place] [facility] [place], [place] W, NP               SUMAtriptan  (IMITREX ) 50 MG tablet [Pharmacy Med Name: SUMATRIPTAN  50MG  TABLETS] 12 tablet 0    Sig: TAKE 1 TO 2 TABLETS BY MOUTH 1 TIME AS NEEDED FOR MIGRAINE. MAY REPEAT IN 2 HOURS IF HEADACHE PERSISTS     Neurology:  Migraine Therapy - Triptan Passed - 10/25/2023  9:17 AM      Passed - Last BP in normal range    BP Readings from Last 1 Encounters:  10/06/23 138/74         Passed - Valid encounter within last 12 months    Recent Outpatient Visits           5 months ago Influenza A   [place] [facility] [place], [place], [place]

## 2024-01-06 ENCOUNTER — Ambulatory Visit
Admission: RE | Admit: 2024-01-06 | Discharge: 2024-01-06 | Disposition: A | Discharge status: Home / Self Care | Source: Ambulatory Visit | Attending: Physician Assistant | Admitting: Physician Assistant

## 2024-01-06 ENCOUNTER — Other Ambulatory Visit: Payer: Self-pay | Admitting: Physician Assistant

## 2024-01-06 DIAGNOSIS — M25562 Pain in left knee: Secondary | ICD-10-CM

## 2024-02-06 ENCOUNTER — Other Ambulatory Visit: Payer: Self-pay | Admitting: Family Medicine

## 2024-02-06 DIAGNOSIS — G43109 Migraine with aura, not intractable, without status migrainosus: Secondary | ICD-10-CM

## 2024-02-08 NOTE — Telephone Encounter (Signed)
 Requested medications are due for refill today.  yes  Requested medications are on the active medications list.  yes  Last refill. 10/25/2023 #12 0 rf  Future visit scheduled.   no  Notes to clinic.  Please review for refill.    Requested Prescriptions  Pending Prescriptions Disp Refills   SUMAtriptan  (IMITREX ) 50 MG tablet [Pharmacy Med Name: SUMATRIPTAN  50MG  TABLETS] 12 tablet 0    Sig: TAKE 1 TO 2 TABLETS BY MOUTH 1 TIME AS NEEDED FOR MIGRAINE. MAY REPEAT IN 2 HOURS IF HEADACHE PERSISTS     Neurology:  Migraine Therapy - Triptan Passed - 02/08/2024 12:55 PM      Passed - Last BP in normal range    BP Readings from Last 1 Encounters:  10/06/23 138/74         Passed - Valid encounter within last 12 months    Recent Outpatient Visits           8 months ago Influenza A   [place] [facility] [place], [place], [place]
